# Patient Record
Sex: Female | Born: 1965 | Race: White | Hispanic: No | Smoking: Current every day smoker
Health system: Southern US, Community
[De-identification: ages and names within clinical notes are randomized; demographics above are authoritative.]

## PROBLEM LIST (undated history)

## (undated) DIAGNOSIS — E7800 Pure hypercholesterolemia, unspecified: Secondary | ICD-10-CM

## (undated) DIAGNOSIS — E079 Disorder of thyroid, unspecified: Secondary | ICD-10-CM

## (undated) DIAGNOSIS — N950 Postmenopausal bleeding: Secondary | ICD-10-CM

## (undated) DIAGNOSIS — E669 Obesity, unspecified: Secondary | ICD-10-CM

## (undated) DIAGNOSIS — F909 Attention-deficit hyperactivity disorder, unspecified type: Secondary | ICD-10-CM

## (undated) DIAGNOSIS — F419 Anxiety disorder, unspecified: Secondary | ICD-10-CM

## (undated) DIAGNOSIS — N3010 Interstitial cystitis (chronic) without hematuria: Secondary | ICD-10-CM

## (undated) DIAGNOSIS — A64 Unspecified sexually transmitted disease: Secondary | ICD-10-CM

## (undated) DIAGNOSIS — R7303 Prediabetes: Secondary | ICD-10-CM

## (undated) DIAGNOSIS — D649 Anemia, unspecified: Secondary | ICD-10-CM

## (undated) DIAGNOSIS — N189 Chronic kidney disease, unspecified: Secondary | ICD-10-CM

## (undated) DIAGNOSIS — K219 Gastro-esophageal reflux disease without esophagitis: Secondary | ICD-10-CM

## (undated) DIAGNOSIS — M25561 Pain in right knee: Secondary | ICD-10-CM

## (undated) DIAGNOSIS — F329 Major depressive disorder, single episode, unspecified: Secondary | ICD-10-CM

## (undated) DIAGNOSIS — F32A Depression, unspecified: Secondary | ICD-10-CM

## (undated) DIAGNOSIS — M549 Dorsalgia, unspecified: Secondary | ICD-10-CM

## (undated) DIAGNOSIS — E559 Vitamin D deficiency, unspecified: Secondary | ICD-10-CM

## (undated) DIAGNOSIS — M1990 Unspecified osteoarthritis, unspecified site: Secondary | ICD-10-CM

## (undated) HISTORY — PX: REPLACEMENT TOTAL KNEE: SUR1224

## (undated) HISTORY — DX: Pain in right knee: M25.561

## (undated) HISTORY — DX: Gastro-esophageal reflux disease without esophagitis: K21.9

## (undated) HISTORY — DX: Vitamin D deficiency, unspecified: E55.9

## (undated) HISTORY — DX: Unspecified osteoarthritis, unspecified site: M19.90

## (undated) HISTORY — DX: Depression, unspecified: F32.A

## (undated) HISTORY — PX: OTHER SURGICAL HISTORY: SHX169

## (undated) HISTORY — DX: Obesity, unspecified: E66.9

## (undated) HISTORY — DX: Major depressive disorder, single episode, unspecified: F32.9

## (undated) HISTORY — DX: Chronic kidney disease, unspecified: N18.9

## (undated) HISTORY — DX: Dorsalgia, unspecified: M54.9

## (undated) HISTORY — DX: Disorder of thyroid, unspecified: E07.9

## (undated) HISTORY — DX: Pure hypercholesterolemia, unspecified: E78.00

## (undated) HISTORY — DX: Anemia, unspecified: D64.9

## (undated) HISTORY — DX: Interstitial cystitis (chronic) without hematuria: N30.10

## (undated) HISTORY — DX: Anxiety disorder, unspecified: F41.9

## (undated) HISTORY — PX: FOOT SURGERY: SHX648

## (undated) HISTORY — DX: Unspecified sexually transmitted disease: A64

---

## 1998-06-20 HISTORY — PX: WISDOM TOOTH EXTRACTION: SHX21

## 2000-04-18 ENCOUNTER — Other Ambulatory Visit: Admission: RE | Admit: 2000-04-18 | Discharge: 2000-04-18 | Payer: Self-pay | Admitting: Gynecology

## 2001-04-19 ENCOUNTER — Other Ambulatory Visit: Admission: RE | Admit: 2001-04-19 | Discharge: 2001-04-19 | Payer: Self-pay | Admitting: Gynecology

## 2002-04-23 ENCOUNTER — Other Ambulatory Visit: Admission: RE | Admit: 2002-04-23 | Discharge: 2002-04-23 | Payer: Self-pay | Admitting: Gynecology

## 2002-10-22 ENCOUNTER — Other Ambulatory Visit: Admission: RE | Admit: 2002-10-22 | Discharge: 2002-10-22 | Payer: Self-pay | Admitting: Gynecology

## 2002-11-25 ENCOUNTER — Ambulatory Visit (HOSPITAL_BASED_OUTPATIENT_CLINIC_OR_DEPARTMENT_OTHER): Admission: RE | Admit: 2002-11-25 | Discharge: 2002-11-25 | Payer: Self-pay | Admitting: Urology

## 2002-11-25 ENCOUNTER — Encounter (INDEPENDENT_AMBULATORY_CARE_PROVIDER_SITE_OTHER): Payer: Self-pay

## 2003-08-08 ENCOUNTER — Other Ambulatory Visit: Admission: RE | Admit: 2003-08-08 | Discharge: 2003-08-08 | Payer: Self-pay | Admitting: Gynecology

## 2004-10-04 ENCOUNTER — Other Ambulatory Visit: Admission: RE | Admit: 2004-10-04 | Discharge: 2004-10-04 | Payer: Self-pay | Admitting: Gynecology

## 2005-10-17 ENCOUNTER — Other Ambulatory Visit: Admission: RE | Admit: 2005-10-17 | Discharge: 2005-10-17 | Payer: Self-pay | Admitting: Gynecology

## 2006-11-08 ENCOUNTER — Other Ambulatory Visit: Admission: RE | Admit: 2006-11-08 | Discharge: 2006-11-08 | Payer: Self-pay | Admitting: Gynecology

## 2007-11-09 ENCOUNTER — Other Ambulatory Visit: Admission: RE | Admit: 2007-11-09 | Discharge: 2007-11-09 | Payer: Self-pay | Admitting: Gynecology

## 2008-03-31 ENCOUNTER — Ambulatory Visit: Payer: Self-pay | Admitting: Gynecology

## 2008-05-06 ENCOUNTER — Ambulatory Visit: Payer: Self-pay | Admitting: Gynecology

## 2009-03-18 ENCOUNTER — Encounter: Payer: Self-pay | Admitting: Gynecology

## 2009-03-18 ENCOUNTER — Other Ambulatory Visit: Admission: RE | Admit: 2009-03-18 | Discharge: 2009-03-18 | Payer: Self-pay | Admitting: Gynecology

## 2009-03-18 ENCOUNTER — Ambulatory Visit: Payer: Self-pay | Admitting: Gynecology

## 2009-09-21 ENCOUNTER — Ambulatory Visit: Payer: Self-pay | Admitting: Gynecology

## 2009-09-21 ENCOUNTER — Other Ambulatory Visit: Admission: RE | Admit: 2009-09-21 | Discharge: 2009-09-21 | Payer: Self-pay | Admitting: Gynecology

## 2009-11-25 ENCOUNTER — Ambulatory Visit: Payer: Self-pay | Admitting: Gynecology

## 2009-12-11 ENCOUNTER — Ambulatory Visit: Payer: Self-pay | Admitting: Gynecology

## 2010-04-08 ENCOUNTER — Other Ambulatory Visit: Admission: RE | Admit: 2010-04-08 | Discharge: 2010-04-08 | Payer: Self-pay | Admitting: Gynecology

## 2010-04-08 ENCOUNTER — Ambulatory Visit: Payer: Self-pay | Admitting: Gynecology

## 2010-04-14 ENCOUNTER — Ambulatory Visit: Payer: Self-pay | Admitting: Gynecology

## 2010-06-16 ENCOUNTER — Encounter
Admission: RE | Admit: 2010-06-16 | Discharge: 2010-06-19 | Payer: Self-pay | Source: Home / Self Care | Admitting: Specialist

## 2010-06-20 ENCOUNTER — Encounter
Admission: RE | Admit: 2010-06-20 | Discharge: 2010-07-20 | Payer: Self-pay | Source: Home / Self Care | Attending: Specialist | Admitting: Specialist

## 2010-07-22 ENCOUNTER — Ambulatory Visit: Payer: 59 | Attending: Specialist | Admitting: *Deleted

## 2010-07-22 ENCOUNTER — Ambulatory Visit: Payer: Self-pay | Admitting: Physical Therapy

## 2010-07-22 DIAGNOSIS — R5381 Other malaise: Secondary | ICD-10-CM | POA: Insufficient documentation

## 2010-07-22 DIAGNOSIS — M25569 Pain in unspecified knee: Secondary | ICD-10-CM | POA: Insufficient documentation

## 2010-07-22 DIAGNOSIS — IMO0001 Reserved for inherently not codable concepts without codable children: Secondary | ICD-10-CM | POA: Insufficient documentation

## 2010-07-29 ENCOUNTER — Ambulatory Visit: Payer: 59 | Admitting: Physical Therapy

## 2010-11-05 NOTE — Op Note (Signed)
NAME:  Sheena [name], Sheena [name]                             ACCOUNT NO.:  [id number]   MEDICAL RECORD NO.:  [id number]                   PATIENT TYPE:  AMB   LOCATION:  [facility]                                 FACILITY:  [facility]   PHYSICIAN:  Sigmund I. Patsi Sears, M.D.         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  Urethral stenosis.   OPERATION:  Urethral dilation, cystourethroscopy, hydrodistention of the  bladder, bladder biopsy, cauterization of the biopsy sites and cauterization  of trigonitis, insertion of Clorpactin, insertion of Marcaine and Pyridium,  injection of Marcaine and Kenalog in the periurethral space and the bladder  base.   SURGEON:  Sigmund I. Patsi Sears, M.D.   ANESTHESIA:  General LMA.   PREPARATION:  After appropriate preanesthesia, the patient was brought to  the operating room and placed on the operating table in the dorsal supine  position where a B&O suppository was placed. She was placed in dorsal  lithotomy position where the pubis was prepped with Betadine solution and  draped in the usual fashion.   HISTORY:  This 45 year old female has urinary frequency and urgency, with  mild urinary incontinence of unknown origin. The patient does not leak on  [facility] test, but does have an IC  symptom index score of 13/5, and an IC  problem index of 10/5. Her urodynamics does show a stable compliant bladder  but with a poor urinary stream of 28 cc/second maximal flow, approximately  25th percentile. She has a 500 mL capacity bladder in the office, with a PVR  of 200 mL.   DESCRIPTION OF PROCEDURE:  With the patient in dorsal lithotomy position,  the inspection revealed a normal BUS with normal hair pattern, and  normal  vaginal estrogenization. The 21 cystoscope could not be placed with the  obturator because of severe urethral stenosis, and this was dilated to a  size 22. The 21 scope was then placed, with mild cracking and bleeding of  the urethra. This was anesthetized at the end of the case with Marcaine and  Kenalog solution.   Cystoscopy revealed a normal appearing bladder, and she was hydrodistended  from 800 to 900 mL capacity under anesthesia. Photo documentation was taken  of her bladder, and bladder biopsies were obtained. The biopsy sites were  cauterized, and the trigonitis noted was also cauterized. Marcaine and  Kenalog was injected into the bladder base. The patient was then awakened  after IV Toradol and taken to the recovery room in good condition.  Sigmund I. Patsi Sears, M.D.    SIT/MEDQ  D:  11/25/2002  T:  11/25/2002  Job:  [id number]   cc:   Marcial Pacas P. Audie Box, M.D.  [address]  [place]  [place] [address]  Fax: [phone]

## 2011-04-11 ENCOUNTER — Other Ambulatory Visit: Payer: Self-pay | Admitting: Gynecology

## 2011-05-30 ENCOUNTER — Other Ambulatory Visit: Payer: Self-pay | Admitting: Gynecology

## 2011-05-30 NOTE — Telephone Encounter (Signed)
Pt has not schedule annual.

## 2011-06-23 ENCOUNTER — Encounter: Payer: Self-pay | Admitting: Gynecology

## 2011-07-18 ENCOUNTER — Encounter: Payer: Self-pay | Admitting: Gynecology

## 2011-07-18 DIAGNOSIS — E079 Disorder of thyroid, unspecified: Secondary | ICD-10-CM | POA: Insufficient documentation

## 2011-07-18 DIAGNOSIS — A64 Unspecified sexually transmitted disease: Secondary | ICD-10-CM | POA: Insufficient documentation

## 2011-07-18 DIAGNOSIS — N3010 Interstitial cystitis (chronic) without hematuria: Secondary | ICD-10-CM | POA: Insufficient documentation

## 2011-07-25 ENCOUNTER — Encounter: Payer: Self-pay | Admitting: Gynecology

## 2011-07-25 ENCOUNTER — Ambulatory Visit (INDEPENDENT_AMBULATORY_CARE_PROVIDER_SITE_OTHER): Payer: BC Managed Care – PPO | Admitting: Gynecology

## 2011-07-25 VITALS — BP 128/72 | Ht 63.0 in | Wt 278.0 lb

## 2011-07-25 DIAGNOSIS — E781 Pure hyperglyceridemia: Secondary | ICD-10-CM | POA: Insufficient documentation

## 2011-07-25 DIAGNOSIS — K219 Gastro-esophageal reflux disease without esophagitis: Secondary | ICD-10-CM | POA: Insufficient documentation

## 2011-07-25 DIAGNOSIS — E7800 Pure hypercholesterolemia, unspecified: Secondary | ICD-10-CM | POA: Insufficient documentation

## 2011-07-25 DIAGNOSIS — Z01419 Encounter for gynecological examination (general) (routine) without abnormal findings: Secondary | ICD-10-CM

## 2011-07-25 DIAGNOSIS — E7849 Other hyperlipidemia: Secondary | ICD-10-CM | POA: Insufficient documentation

## 2011-07-25 NOTE — Patient Instructions (Signed)
Follow up in one year for your GYN checkup.   Consider Stop Smoking.  Help is available at [facility] smoking cessation program @ www.[place].com or 564-105-9669. OR 1-800-QUIT-NOW 551-528-1889) for free smoking cessation counseling.   Smoking Hazards Smoking cigarettes is extremely bad for your health. Tobacco smoke has over 200 known poisons in it. There are over 60 chemicals in tobacco smoke that cause cancer. Some of the chemicals found in cigarette smoke include:  Cyanide.  Benzene.  Formaldehyde.  Methanol (wood alcohol).  Acetylene (fuel used in welding torches).  Ammonia.  Cigarette smoke also contains the poisonous gases nitrogen oxide and carbon monoxide.  Cigarette smokers have an increased risk of many serious medical problems, including: Lung cancer.  Lung disease (such as pneumonia, bronchitis, and emphysema).  Heart attack and chest pain due to the heart not getting enough oxygen (angina).  Heart disease and peripheral blood vessel disease.  Hypertension.  Stroke.  Oral cancer (cancer of the lip, mouth, or voice box).  Bladder cancer.  Pancreatic cancer.  Cervical cancer.  Pregnancy complications, including premature birth.  Low birthweight babies.  Early menopause.  Lower estrogen level for women.  Infertility.  Facial wrinkles.  Blindness.  Increased risk of broken bones (fractures).  Senile dementia.  Stillbirths and smaller newborn babies, birth defects, and genetic damage to sperm.  Stomach ulcers and internal bleeding.  Children of smokers have an increased risk of the following, because of secondhand smoke exposure:  Sudden infant death syndrome (SIDS).  Respiratory infections.  Lung cancer.  Heart disease.  Ear infections.  Smoking causes approximately: 90% of all lung cancer deaths in men.  80% of all lung cancer deaths in women.  90% of deaths from chronic obstructive lung disease.  Compared with nonsmokers, smoking increases  the risk of: Coronary heart disease by 2 to 4 times.  Stroke by 2 to 4 times.  Men developing lung cancer by 23 times.  Women developing lung cancer by 13 times.  Dying from chronic obstructive lung diseases by 12 times.  Someone who smokes 2 packs a day loses about 8 years of his or her life. Even smoking lightly shortens your life expectancy by several years. You can greatly reduce the risk of medical problems for you and your family by stopping now. Smoking is the most preventable cause of death and disease in our society. Within days of quitting smoking, your circulation returns to normal, you decrease the risk of having a heart attack, and your lung capacity improves. There may be some increased phlegm in the first few days after quitting, and it may take months for your lungs to clear up completely. Quitting for 10 years cuts your lung cancer risk to almost that of a nonsmoker. WHY IS SMOKING ADDICTIVE? Nicotine is the chemical agent in tobacco that is capable of causing addiction or dependence.  When you smoke and inhale, nicotine is absorbed rapidly into the bloodstream through your lungs. Nicotine absorbed through the lungs is capable of creating a powerful addiction. Both inhaled and non-inhaled nicotine may be addictive.  Addiction studies of cigarettes and spit tobacco show that addiction to nicotine occurs mainly during the teen years, when young people begin using tobacco products.  WHAT ARE THE BENEFITS OF QUITTING?  There are many health benefits to quitting smoking.  Likelihood of developing cancer and heart disease decreases. Health improvements are seen almost immediately.  Blood pressure, pulse rate, and breathing patterns start returning to normal soon after quitting.  People who quit may see an improvement in their overall quality of life.  Some people choose to quit all at once. Other options include nicotine replacement products, such as patches, gum, and nasal sprays. Do not  use these products without first checking with your caregiver. QUITTING SMOKING It is not easy to quit smoking. Nicotine is addicting, and longtime habits are hard to change. To start, you can write down all your reasons for quitting, tell your family and friends you want to quit, and ask for their help. Throw your cigarettes away, chew gum or cinnamon sticks, keep your hands busy, and drink extra water or juice. Go for walks and practice deep breathing to relax. Think of all the money you are saving: around $1,000 a year, for the average pack-a-day smoker. Nicotine patches and gum have been shown to improve success at efforts to stop smoking. Zyban (bupropion) is an anti-depressant drug that can be prescribed to reduce nicotine withdrawal symptoms and to suppress the urge to smoke. Smoking is an addiction with both physical and psychological effects. Joining a stop-smoking support group can help you cope with the emotional issues. For more information and advice on programs to stop smoking, call your doctor, your local hospital, or these organizations: American Lung Association - 1-800-LUNGUSA  American Cancer Society - 1-800-ACS-2345  Document Released: 07/14/2004 Document Revised: 02/16/2011 Document Reviewed: 03/18/2009 [facility] Patient Information 2012 [place], [place].  Smoking Cessation This document explains the best ways for you to quit smoking and new treatments to help. It lists new medicines that can double or triple your chances of quitting and quitting for good. It also considers ways to avoid relapses and concerns you may have about quitting, including weight gain. NICOTINE: A POWERFUL ADDICTION If you have tried to quit smoking, you know how hard it can be. It is hard because nicotine is a very addictive drug. For some people, it can be as addictive as heroin or cocaine. Usually, people make 2 or 3 tries, or more, before finally being able to quit. Each time you try to quit, you can learn  about what helps and what hurts. Quitting takes hard work and a lot of effort, but you can quit smoking. QUITTING SMOKING IS ONE OF THE MOST IMPORTANT THINGS YOU WILL EVER DO.  You will live longer, feel better, and live better.   The impact on your body of quitting smoking is felt almost immediately:   Within 20 minutes, blood pressure decreases. Pulse returns to its normal level.   After 8 hours, carbon monoxide levels in the blood return to normal. Oxygen level increases.   After 24 hours, chance of heart attack starts to decrease. Breath, hair, and body stop smelling like smoke.   After 48 hours, damaged nerve endings begin to recover. Sense of taste and smell improve.   After 72 hours, the body is virtually free of nicotine. Bronchial tubes relax and breathing becomes easier.   After 2 to 12 weeks, lungs can hold more air. Exercise becomes easier and circulation improves.   Quitting will reduce your risk of having a heart attack, stroke, cancer, or lung disease:   After 1 year, the risk of coronary heart disease is cut in half.   After 5 years, the risk of stroke falls to the same as a nonsmoker.   After 10 years, the risk of lung cancer is cut in half and the risk of other cancers decreases significantly.   After 15 years, the risk of  coronary heart disease drops, usually to the level of a nonsmoker.   If you are pregnant, quitting smoking will improve your chances of having a healthy baby.   The people you live with, especially your children, will be healthier.   You will have extra money to spend on things other than cigarettes.  FIVE KEYS TO QUITTING Studies have shown that these 5 steps will help you quit smoking and quit for good. You have the best chances of quitting if you use them together: 1. Get ready.  2. Get support and encouragement.  3. Learn new skills and behaviors.  4. Get medicine to reduce your nicotine addiction and use it correctly.  5. Be prepared  for relapse or difficult situations. Be determined to continue trying to quit, even if you do not succeed at first.  1. GET READY  Set a quit date.   Change your environment.   Get rid of ALL cigarettes, ashtrays, matches, and lighters in your home, car, and place of work.   Do not let people smoke in your home.   Review your past attempts to quit. Think about what worked and what did not.   Once you quit, do not smoke. NOT EVEN A PUFF!  2. GET SUPPORT AND ENCOURAGEMENT Studies have shown that you have a better chance of being successful if you have help. You can get support in many ways.  Tell your family, friends, and coworkers that you are going to quit and need their support. Ask them not to smoke around you.   Talk to your caregivers (doctor, dentist, nurse, pharmacist, psychologist, and/or smoking counselor).   Get individual, group, or telephone counseling and support. The more counseling you have, the better your chances are of quitting. Programs are available at [employer] and health centers. Call your local health department for information about programs in your area.   Spiritual beliefs and practices may help some smokers quit.   Quit meters are Photographer that keep track of quit statistics, such as amount of "quit-time," cigarettes not smoked, and money saved.   Many smokers find one or more of the many self-help books available useful in helping them quit and stay off tobacco.  3. LEARN NEW SKILLS AND BEHAVIORS  Try to distract yourself from urges to smoke. Talk to someone, go for a walk, or occupy your time with a task.   When you first try to quit, change your routine. Take a different route to work. Drink tea instead of coffee. Eat breakfast in a different place.   Do something to reduce your stress. Take a hot bath, exercise, or read a book.   Plan something enjoyable to do every day. Reward yourself for not smoking.    Explore interactive web-based programs that specialize in helping you quit.  4. GET MEDICINE AND USE IT CORRECTLY Medicines can help you stop smoking and decrease the urge to smoke. Combining medicine with the above behavioral methods and support can quadruple your chances of successfully quitting smoking. The U.S. Food and Drug Administration (FDA) has approved 7 medicines to help you quit smoking. These medicines fall into 3 categories.  Nicotine replacement therapy (delivers nicotine to your body without the negative effects and risks of smoking):   Nicotine gum: Available over-the-counter.   Nicotine lozenges: Available over-the-counter.   Nicotine inhaler: Available by prescription.   Nicotine nasal spray: Available by prescription.   Nicotine skin patches (transdermal): Available by prescription and  over-the-counter.   Antidepressant medicine (helps people abstain from smoking, but how this works is unknown):   Bupropion sustained-release (SR) tablets: Available by prescription.   Nicotinic receptor partial agonist (simulates the effect of nicotine in your brain):   Varenicline tartrate tablets: Available by prescription.   Ask your caregiver for advice about which medicines to use and how to use them. Carefully read the information on the package.   Everyone who is trying to quit may benefit from using a medicine. If you are pregnant or trying to become pregnant, nursing an infant, you are under age 85, or you smoke fewer than 10 cigarettes per day, talk to your caregiver before taking any nicotine replacement medicines.   You should stop using a nicotine replacement product and call your caregiver if you experience nausea, dizziness, weakness, vomiting, fast or irregular heartbeat, mouth problems with the lozenge or gum, or redness or swelling of the skin around the patch that does not go away.   Do not use any other product containing nicotine while using a nicotine  replacement product.   Talk to your caregiver before using these products if you have diabetes, heart disease, asthma, stomach ulcers, you had a recent heart attack, you have high blood pressure that is not controlled with medicine, a history of irregular heartbeat, or you have been prescribed medicine to help you quit smoking.  5. BE PREPARED FOR RELAPSE OR DIFFICULT SITUATIONS  Most relapses occur within the first 3 months after quitting. Do not be discouraged if you start smoking again. Remember, most people try several times before they finally quit.   You may have symptoms of withdrawal because your body is used to nicotine. You may crave cigarettes, be irritable, feel very hungry, cough often, get headaches, or have difficulty concentrating.   The withdrawal symptoms are only temporary. They are strongest when you first quit, but they will go away within 10 to 14 days.  Here are some difficult situations to watch for:  Alcohol. Avoid drinking alcohol. Drinking lowers your chances of successfully quitting.   Caffeine. Try to reduce the amount of caffeine you consume. It also lowers your chances of successfully quitting.   Other smokers. Being around smoking can make you want to smoke. Avoid smokers.   Weight gain. Many smokers will gain weight when they quit, usually less than 10 pounds. Eat a healthy diet and stay active. Do not let weight gain distract you from your main goal, quitting smoking. Some medicines that help you quit smoking may also help delay weight gain. You can always lose the weight gained after you quit.   Bad mood or depression. There are a lot of ways to improve your mood other than smoking.  If you are having problems with any of these situations, talk to your caregiver. SPECIAL SITUATIONS AND CONDITIONS Studies suggest that everyone can quit smoking. Your situation or condition can give you a special reason to quit.  Pregnant women/new mothers: By quitting, you  protect your baby's health and your own.   Hospitalized patients: By quitting, you reduce health problems and help healing.   Heart attack patients: By quitting, you reduce your risk of a second heart attack.   Lung, head, and neck cancer patients: By quitting, you reduce your chance of a second cancer.   Parents of children and adolescents: By quitting, you protect your children from illnesses caused by secondhand smoke.  QUESTIONS TO THINK ABOUT Think about the following questions before you try  to stop smoking. You may want to talk about your answers with your caregiver.  Why do you want to quit?   If you tried to quit in the past, what helped and what did not?   What will be the most difficult situations for you after you quit? How will you plan to handle them?   Who can help you through the tough times? Your family? Friends? Caregiver?   What pleasures do you get from smoking? What ways can you still get pleasure if you quit?  Here are some questions to ask your caregiver:  How can you help me to be successful at quitting?   What medicine do you think would be best for me and how should I take it?   What should I do if I need more help?   What is smoking withdrawal like? How can I get information on withdrawal?  Quitting takes hard work and a lot of effort, but you can quit smoking. FOR MORE INFORMATION  Smokefree.gov (http://www.davis-sullivan.com/) provides free, accurate, evidence-based information and professional assistance to help support the immediate and long-term needs of people trying to quit smoking. Document Released: 05/31/2001 Document Revised: 02/16/2011 Document Reviewed: 03/23/2009 [facility] Patient Information 2012 [place], [place].

## 2011-07-25 NOTE — Progress Notes (Signed)
Sheena [name] 1966/02/14 [id number]        46 y.o.  for annual exam.  Several issues noted below.  Past medical history,surgical history, medications, allergies, family history and social history were all reviewed and documented in the EPIC chart. ROS:  Was performed and pertinent positives and negatives are included in the history.  Exam: Sherrilyn Rist chaperone present Filed Vitals:   07/25/11 1507  BP: 128/72   General appearance  Normal Skin grossly normal Head/Neck normal with no cervical or supraclavicular adenopathy thyroid normal Lungs  clear Cardiac RR, without RMG Abdominal  soft, nontender, without masses, organomegaly or hernia Breasts  examined lying and sitting without masses, retractions, discharge or axillary adenopathy. Pelvic  Ext/BUS/vagina  normal   Cervix  normal    Uterus  axial, normal size, shape and contour, midline and mobile nontender. Exam limited by abdominal girth  Adnexa  Without masses or tenderness.  Exam limited by abdominal girth  Anus and perineum  normal   Rectovaginal  normal sphincter tone without palpated masses or tenderness.    Assessment/Plan:  46 y.o. female for annual exam.   Monthly menses. 1. Contraception. Patient using condoms. Again discussed the failure risk with these and she is not interested in anything else. I discussed the availability of Plan B in case there is a break or slip of the condom. 2. Breast health. SBE monthly reviewed. She had mammography January 2013 will continued annual mammography. 3. Pap smear. No Pap was done today. She has no history of significant abnormalities in multiple normal reports in her chart the last one in October 2011.  I discussed current screening guidelines and we'll plan on an every 3 year Pap smears she agrees. 4. Smoking. I again discussed stop smoking and provided strategies to help her with this. 5. Weight. I discussed weight loss and exercise options. 6. 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 She does have a prescription of Valtrex to start if she does develop symptoms early. 7. Cymbalta. She had stopped her Cymbalta a week ago on her own do to feelings of lethargy and actually feels better off of it. She'll continue to stay off of this. As long as she continues well then we'll follow. If she does have return of anxiety or depression discussed the possible trial of a lower dose at 30 mg daily. 8. Health maintenance. Blood work was done today this is all done through Dr. Vella Redhead office who follows her for her hypothyroidism and also follows her cholesterol and glycemic control.    Dara Lords MD, 3:32 PM 07/25/2011

## 2011-08-23 ENCOUNTER — Telehealth: Payer: Self-pay | Admitting: *Deleted

## 2011-08-23 MED ORDER — DULOXETINE HCL 30 MG PO CPEP: 30.0000 mg | ORAL_CAPSULE | Freq: Every day | ORAL | Status: DC

## 2011-08-23 NOTE — Telephone Encounter (Signed)
Pt called requesting to start back on Cymbalta, she is feeling anxiety & depression. Pt was told to call and she would be started back medication and a lower dose. Please advise

## 2011-08-23 NOTE — Telephone Encounter (Signed)
She can try Cymbalta 30 mg daily. If she has any issues with this call me.

## 2011-08-23 NOTE — Telephone Encounter (Signed)
Pt informed with the below note. 

## 2011-09-12 ENCOUNTER — Telehealth: Payer: Self-pay | Admitting: *Deleted

## 2011-09-12 MED ORDER — VALACYCLOVIR HCL 500 MG PO TABS: 500.0000 mg | ORAL_TABLET | Freq: Two times a day (BID) | ORAL | Status: AC

## 2011-09-12 NOTE — Telephone Encounter (Signed)
Patient calling requesting rx for Valtrex.  Currently having an outbreak.

## 2011-09-12 NOTE — Telephone Encounter (Signed)
Valtrex 500 mg bid x 5 days

## 2011-09-12 NOTE — Telephone Encounter (Signed)
Patient informed. 

## 2012-06-27 ENCOUNTER — Encounter: Payer: Self-pay | Admitting: Gynecology

## 2012-07-12 ENCOUNTER — Telehealth: Payer: Self-pay | Admitting: *Deleted

## 2012-07-12 NOTE — Telephone Encounter (Signed)
Pt called c/o no cycle since November 2013, she would have symptoms of breast tenderness and headaches but no cycle would start. She mention that cycles at times would be irregular but this would be a first without one. Her annual is due in Feb. She would like you to be aware of the above. Please advise

## 2012-07-12 NOTE — Telephone Encounter (Signed)
Recommend office visit for evaluation. If patient wants to wait for her annual at her choice and that's okay her come in now I'll see her. Regardless this needs to be evaluated.

## 2012-07-13 NOTE — Telephone Encounter (Signed)
Pt informed with the below note. 

## 2012-07-31 ENCOUNTER — Ambulatory Visit (INDEPENDENT_AMBULATORY_CARE_PROVIDER_SITE_OTHER): Payer: BC Managed Care – PPO | Admitting: Gynecology

## 2012-07-31 ENCOUNTER — Encounter: Payer: Self-pay | Admitting: Gynecology

## 2012-07-31 ENCOUNTER — Other Ambulatory Visit (HOSPITAL_COMMUNITY)
Admission: RE | Admit: 2012-07-31 | Discharge: 2012-07-31 | Disposition: A | Discharge status: Home / Self Care | Payer: BC Managed Care – PPO | Source: Ambulatory Visit | Attending: Gynecology | Admitting: Gynecology

## 2012-07-31 VITALS — BP 124/80 | Ht 63.0 in | Wt 267.0 lb

## 2012-07-31 DIAGNOSIS — N926 Irregular menstruation, unspecified: Secondary | ICD-10-CM

## 2012-07-31 DIAGNOSIS — N951 Menopausal and female climacteric states: Secondary | ICD-10-CM

## 2012-07-31 DIAGNOSIS — Z01419 Encounter for gynecological examination (general) (routine) without abnormal findings: Secondary | ICD-10-CM | POA: Insufficient documentation

## 2012-07-31 DIAGNOSIS — Z1151 Encounter for screening for human papillomavirus (HPV): Secondary | ICD-10-CM | POA: Insufficient documentation

## 2012-07-31 NOTE — Progress Notes (Signed)
Sheena [name] Jan 17, 1966 [id number]        47 y.o.  G1P1001 for annual exam.  Several issues noted below.  Past medical history,surgical history, medications, allergies, family history and social history were all reviewed and documented in the EPIC chart. ROS:  Was performed and pertinent positives and negatives are included in the history.  Exam: Kim assistant Filed Vitals:   07/31/12 1358  BP: 124/80  Height: 5\' 3"  (1.6 m)  Weight: 267 lb (121.11 kg)   General appearance  Normal Skin grossly normal Head/Neck normal with no cervical or supraclavicular adenopathy thyroid normal Lungs  clear Cardiac RR, without RMG Abdominal  soft, nontender, without masses, organomegaly or hernia Breasts  examined lying and sitting without masses, retractions, discharge or axillary adenopathy. Pelvic  Ext/BUS/vagina  normal   Cervix  normal   Uterus  grossly normal size,midline and mobile nontender   Adnexa  Without masses or tenderness    Anus and perineum  normal   Rectovaginal  normal sphincter tone without palpated masses or tenderness.    Assessment/Plan:  47 y.o. G67P1001 female for annual exam.   1. Irregular menses/menopausal symptoms. Patient normally has every 30 day menses sometimes 45 days.  Notes that her last menstrual period was in October and then she skipped November and December but then had a menses the end of January. She is noticing some hot flashes and night sweats.  Will check baseline TSH FSH prolactin. Will keep menstrual calendar and as long as regular them we'll continue to follow. If irregularity continues she knows to follow up with me. 2. Joint aching. Patient notes waking with her joints aching. She does have arthroscopic issues in both knees. I reviewed the issue of weight and joint stress. We'll check ANA for completeness. 3. Contraception. Patient using condoms. We've discussed this previously and I reviewed again the failure risk and availability of Plan B. Patient  not interested in anything further. 4. 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 We'll continue to monitor. 5. Mammography 06/2012. Patient noted some left breast tenderness since the mammogram. Exam today is normal. Recommended heat and ibuprofen. Assuming it resolved then we will follow. If it persists or she feels anything she will call. 6. Pap smear 2011. Pap/HPV today. Discussed adding HPV to the screen. No history of abnormal Pap smears previously. If normal then planned 5 year follow up. 7. Stop smoking discussed and encouraged. Patient is attempting to cut back. 8. Weight control. Dr. Evlyn Kanner started her on a weight reducing medication. I discussed the need to modify diet and increase activity with this. 9. Health maintenance. No other blood work done as it is all done through Dr. Rinaldo Cloud office.  Follow up in one year, sooner as needed. 10Dara Lords MD, 2:32 PM 07/31/2012

## 2012-07-31 NOTE — Patient Instructions (Signed)
Follow up for lab work. Follow up for annual exam in one year

## 2012-08-01 ENCOUNTER — Encounter: Payer: Self-pay | Admitting: Gynecology

## 2012-08-01 LAB — TSH: TSH: 1.022 u[IU]/mL (ref 0.350–4.500)

## 2012-08-01 LAB — FOLLICLE STIMULATING HORMONE: FSH: 5 m[IU]/mL

## 2012-08-01 LAB — PROLACTIN: Prolactin: 23 ng/mL

## 2012-08-06 ENCOUNTER — Other Ambulatory Visit: Payer: Self-pay | Admitting: Gynecology

## 2012-08-06 MED ORDER — FLUCONAZOLE 150 MG PO TABS: 150.0000 mg | ORAL_TABLET | Freq: Once | ORAL | Status: DC

## 2012-09-28 ENCOUNTER — Other Ambulatory Visit: Payer: Self-pay | Admitting: Gynecology

## 2013-04-18 ENCOUNTER — Ambulatory Visit (INDEPENDENT_AMBULATORY_CARE_PROVIDER_SITE_OTHER): Payer: BC Managed Care – PPO | Admitting: Nurse Practitioner

## 2013-04-18 ENCOUNTER — Encounter: Payer: Self-pay | Admitting: Nurse Practitioner

## 2013-04-18 ENCOUNTER — Telehealth: Payer: Self-pay | Admitting: Nurse Practitioner

## 2013-04-18 VITALS — BP 167/86 | HR 93 | Temp 100.0°F | Ht 63.0 in | Wt 272.0 lb

## 2013-04-18 DIAGNOSIS — R300 Dysuria: Secondary | ICD-10-CM

## 2013-04-18 DIAGNOSIS — N390 Urinary tract infection, site not specified: Secondary | ICD-10-CM

## 2013-04-18 LAB — POCT URINALYSIS DIPSTICK
Ketones, UA: NEGATIVE
Protein, UA: NEGATIVE
Spec Grav, UA: 1.01
Urobilinogen, UA: NEGATIVE
pH, UA: 6.5

## 2013-04-18 LAB — POCT UA - MICROSCOPIC ONLY
Casts, Ur, LPF, POC: NEGATIVE
Yeast, UA: NEGATIVE

## 2013-04-18 MED ORDER — NITROFURANTOIN MONOHYD MACRO 100 MG PO CAPS: 100.0000 mg | ORAL_CAPSULE | Freq: Two times a day (BID) | ORAL | Status: DC

## 2013-04-18 NOTE — Telephone Encounter (Signed)
appt given for today 

## 2013-04-18 NOTE — Patient Instructions (Addendum)

## 2013-04-18 NOTE — Progress Notes (Signed)
  Subjective:    Patient ID: Sheena [name], female    DOB: [birth year], 47 y.o.   MRN: [id number]  Urinary Tract Infection  This is a new problem. The current episode started in the past 7 days. The problem occurs every urination. The problem has been gradually worsening. The pain is at a severity of 0/10. The patient is experiencing no pain. The maximum temperature recorded prior to her arrival was 100 - 100.9 F. The fever has been present for 1 - 2 days. There is no history of pyelonephritis. Associated symptoms include frequency. Pertinent negatives include no hematuria, hesitancy or urgency. She has tried nothing for the symptoms.      Review of Systems  Genitourinary: Positive for frequency. Negative for hesitancy, urgency and hematuria.  All other systems reviewed and are negative.       Objective:   Physical Exam  Vitals reviewed. Constitutional: She is oriented to person, place, and time. She appears well-developed and well-nourished.  Cardiovascular: Normal rate, regular rhythm, normal heart sounds and intact distal pulses.   Pulmonary/Chest: Effort normal and breath sounds normal.  Musculoskeletal: Normal range of motion. She exhibits no tenderness (Neg for CVA tenderness).  Neurological: She is alert and oriented to person, place, and time.  Psychiatric: She has a normal mood and affect. Her behavior is normal. Judgment and thought content normal.    BP 167/86  Pulse 93  Temp(Src) 100 F (37.8 C) (Oral)  Ht 5\' 3"  (1.6 m)  Wt 272 lb (123.378 kg)  BMI 48.19 kg/m2   Results for orders placed in visit on 04/18/13  POCT URINALYSIS DIPSTICK      Result Value Range   Color, UA yellow     Clarity, UA clear     Glucose, UA neg     Bilirubin, UA neg     Ketones, UA neg     Spec Grav, UA 1.010     Blood, UA neg     pH, UA 6.5     Protein, UA neg     Urobilinogen, UA negative     Nitrite, UA neg     Leukocytes, UA Negative          Assessment & Plan:   1. UTI  (urinary tract infection)   2. Dysuria    AZO OTC if helps Force Fluids  Mary-Margaret Daphine Deutscher, FNP

## 2013-04-25 ENCOUNTER — Other Ambulatory Visit: Payer: Self-pay

## 2013-05-20 ENCOUNTER — Ambulatory Visit (INDEPENDENT_AMBULATORY_CARE_PROVIDER_SITE_OTHER): Payer: BC Managed Care – PPO | Admitting: Family Medicine

## 2013-05-20 ENCOUNTER — Encounter: Payer: Self-pay | Admitting: Family Medicine

## 2013-05-20 VITALS — BP 116/70 | HR 100 | Temp 98.2°F | Ht 63.0 in | Wt 271.0 lb

## 2013-05-20 DIAGNOSIS — Z23 Encounter for immunization: Secondary | ICD-10-CM

## 2013-05-20 DIAGNOSIS — L0291 Cutaneous abscess, unspecified: Secondary | ICD-10-CM

## 2013-05-20 MED ORDER — DOXYCYCLINE HYCLATE 100 MG PO CAPS: 100.0000 mg | ORAL_CAPSULE | Freq: Two times a day (BID) | ORAL | Status: DC

## 2013-05-20 MED ORDER — MUPIROCIN 2 % EX OINT: 1.0000 "application " | TOPICAL_OINTMENT | Freq: Two times a day (BID) | CUTANEOUS | Status: DC

## 2013-05-20 NOTE — Progress Notes (Signed)
Patient name: Sheena [name] Medical record number: [id number] Date of birth: [birth year] Age: 47 y.o. Gender: female  Primary Care Provider: Rudi Heap, MD  Chief Complaint: abscess History of Present Illness: Pt presents today with chief complaint of abscess Pt initially noticed pruritic lesion on R abdomen earlier in the week.  Pt chronically scratched area until it drained.  Pt states area began to get mildly painful with purulent drainage over the last 1-2 days No fever or chills.  Non diabetic 1PPD smoker   Past Medical History: Patient Active Problem List   Diagnosis Date Noted  . High cholesterol   . GERD (gastroesophageal reflux disease)   . STD (sexually transmitted disease)   . Thyroid disease   . IC (interstitial cystitis)    Past Medical History  Diagnosis Date  . STD (sexually transmitted disease)     HSV ll  . Thyroid disease     Hypothyroid  . IC (interstitial cystitis)   . Depression   . High cholesterol   . GERD (gastroesophageal reflux disease)     Past Surgical History: Past Surgical History  Procedure Laterality Date  . Cesarean section    . Foot surgery      Social History: History   Social History  . Marital Status: Married    Spouse Name: N/A    Number of Children: N/A  . Years of Education: N/A   Social History Main Topics  . Smoking status: Current Every Day Smoker -- 1.00 packs/day  . Smokeless tobacco: Never Used  . Alcohol Use: No  . Drug Use: No  . Sexual Activity: Yes    Birth Control/ Protection: Condom     Comment: very little   Other Topics Concern  . None   Social History Narrative  . None    Family History: Family History  Problem Relation Age of Onset  . Diabetes Father   . Hypertension Father   . Cancer Father     melanoma  . Rheum arthritis Mother   . Breast cancer Cousin 25    Pat 1st cousin    Allergies: Allergies  Allergen Reactions  . Sulfa Antibiotics     Current Outpatient Prescriptions   Medication Sig Dispense Refill  . DULoxetine (CYMBALTA) 30 MG capsule TAKE ONE CAPSULE BY MOUTH EVERY DAY  30 capsule  11  . FENOFIBRATE PO Take by mouth.      . Levothyroxine Sodium (SYNTHROID PO) Take 175 mcg by mouth.       . Omeprazole (PRILOSEC PO) Take by mouth.      . TOPIRAMATE PO Take by mouth.      . doxycycline (VIBRAMYCIN) 100 MG capsule Take 1 capsule (100 mg total) by mouth 2 (two) times daily.  20 capsule  0  . mupirocin ointment (BACTROBAN) 2 % Place 1 application into the nose 2 (two) times daily.  22 g  0   No current facility-administered medications for this visit.   Review Of Systems: 12 point ROS negative except as noted above in HPI.  Physical Exam: Filed Vitals:   05/20/13 1117  BP: 116/70  Pulse: 100  Temp: 98.2 F (36.8 C)    General: alert and cooperative HEENT: PERRLA and extra ocular movement intact Heart: S1, S2 normal, no murmur, rub or gallop, regular rate and rhythm Lungs: clear to auscultation, no wheezes or rales and unlabored breathing Abdomen: R upper abdomen lesion (0.5-1 cm circumferential lesion, mildly tender, mild purulent lesion) Extremities: extremities normal, atraumatic,  no cyanosis or edema Skin:as above  Neurology: normal without focal findings  Labs and Imaging:   Assessment and Plan: Need for Tdap vaccination - Plan: Tdap vaccine greater than or equal to 7yo IM  Abscess - Plan: mupirocin ointment (BACTROBAN) 2 %, doxycycline (VIBRAMYCIN) 100 MG capsule  Wound culture  Will place on topical bactroban and doxy for infectious coverage pending wound culture Discussed derm and infectious red flags at length Follow up as needed.          Doree Albee MD

## 2013-05-20 NOTE — Patient Instructions (Addendum)
Smoking Cessation Quitting smoking is important to your health and has many advantages. However, it is not always easy to quit since nicotine is a very addictive drug. Often times, people try 3 times or more before being able to quit. This document explains the best ways for you to prepare to quit smoking. Quitting takes hard work and a lot of effort, but you can do it. ADVANTAGES OF QUITTING SMOKING  You will live longer, feel better, and live better.  Your body will feel the impact of quitting smoking almost immediately.  Within 20 minutes, blood pressure decreases. Your pulse returns to its normal level.  After 8 hours, carbon monoxide levels in the blood return to normal. Your oxygen level increases.  After 24 hours, the chance of having a heart attack starts to decrease. Your breath, hair, and body stop smelling like smoke.  After 48 hours, damaged nerve endings begin to recover. Your sense of taste and smell improve.  After 72 hours, the body is virtually free of nicotine. Your bronchial tubes relax and breathing becomes easier.  After 2 to 12 weeks, lungs can hold more air. Exercise becomes easier and circulation improves.  The risk of having a heart attack, stroke, cancer, or lung disease is greatly reduced.  After 1 year, the risk of coronary heart disease is cut in half.  After 5 years, the risk of stroke falls to the same as a nonsmoker.  After 10 years, the risk of lung cancer is cut in half and the risk of other cancers decreases significantly.  After 15 years, the risk of coronary heart disease drops, usually to the level of a nonsmoker.  If you are pregnant, quitting smoking will improve your chances of having a healthy baby.  The people you live with, especially any children, will be healthier.  You will have extra money to spend on things other than cigarettes. QUESTIONS TO THINK ABOUT BEFORE ATTEMPTING TO QUIT You may want to talk about your answers with your  caregiver.  Why do you want to quit?  If you tried to quit in the past, what helped and what did not?  What will be the most difficult situations for you after you quit? How will you plan to handle them?  Who can help you through the tough times? Your family? Friends? A caregiver?  What pleasures do you get from smoking? What ways can you still get pleasure if you quit? Here are some questions to ask your caregiver:  How can you help me to be successful at quitting?  What medicine do you think would be best for me and how should I take it?  What should I do if I need more help?  What is smoking withdrawal like? How can I get information on withdrawal? GET READY  Set a quit date.  Change your environment by getting rid of all cigarettes, ashtrays, matches, and lighters in your home, car, or work. Do not let people smoke in your home.  Review your past attempts to quit. Think about what worked and what did not. GET SUPPORT AND ENCOURAGEMENT You have a better chance of being successful if you have help. You can get support in many ways.  Tell your family, friends, and co-workers that you are going to quit and need their support. Ask them not to smoke around you.  Get individual, group, or telephone counseling and support. Programs are available at local hospitals and health centers. Call your local health department for   information about programs in your area.  Spiritual beliefs and practices may help some smokers quit.  Download a "quit meter" on your computer to keep track of quit statistics, such as how long you have gone without smoking, cigarettes not smoked, and money saved.  Get a self-help book about quitting smoking and staying off of tobacco. LEARN NEW SKILLS AND BEHAVIORS  Distract yourself from urges to smoke. Talk to someone, go for a walk, or occupy your time with a task.  Change your normal routine. Take a different route to work. Drink tea instead of coffee.  Eat breakfast in a different place.  Reduce your stress. Take a hot bath, exercise, or read a book.  Plan something enjoyable to do every day. Reward yourself for not smoking.  Explore interactive web-based programs that specialize in helping you quit. GET MEDICINE AND USE IT CORRECTLY Medicines can help you stop smoking and decrease the urge to smoke. Combining medicine with the above behavioral methods and support can greatly increase your chances of successfully quitting smoking.  Nicotine replacement therapy helps deliver nicotine to your body without the negative effects and risks of smoking. Nicotine replacement therapy includes nicotine gum, lozenges, inhalers, nasal sprays, and skin patches. Some may be available over-the-counter and others require a prescription.  Antidepressant medicine helps people abstain from smoking, but how this works is unknown. This medicine is available by prescription.  Nicotinic receptor partial agonist medicine simulates the effect of nicotine in your brain. This medicine is available by prescription. Ask your caregiver for advice about which medicines to use and how to use them based on your health history. Your caregiver will tell you what side effects to look out for if you choose to be on a medicine or therapy. Carefully read the information on the package. Do not use any other product containing nicotine while using a nicotine replacement product.  RELAPSE OR DIFFICULT SITUATIONS Most relapses occur within the first 3 months after quitting. Do not be discouraged if you start smoking again. Remember, most people try several times before finally quitting. You may have symptoms of withdrawal because your body is used to nicotine. You may crave cigarettes, be irritable, feel very hungry, cough often, get headaches, or have difficulty concentrating. The withdrawal symptoms are only temporary. They are strongest when you first quit, but they will go away within  10 14 days. To reduce the chances of relapse, try to:  Avoid drinking alcohol. Drinking lowers your chances of successfully quitting.  Reduce the amount of caffeine you consume. Once you quit smoking, the amount of caffeine in your body increases and can give you symptoms, such as a rapid heartbeat, sweating, and anxiety.  Avoid smokers because they can make you want to smoke.  Do not let weight gain distract you. Many smokers will gain weight when they quit, usually less than 10 pounds. Eat a healthy diet and stay active. You can always lose the weight gained after you quit.  Find ways to improve your mood other than smoking. FOR MORE INFORMATION  www.smokefree.gov  Document Released: 05/31/2001 Document Revised: 12/06/2011 Document Reviewed: 09/15/2011 [facility] Patient Information 2014 [place], [place].  Diphtheria/Tetanus Toxoids; Pertussis Vaccine, DTP injection What is this medicine? DIPHTHERIA and TETANUS TOXOIDS; PERTUSSIS VACCINE (dif THEER ee uh and TET n [place] TOK soids; per TUS iss vak SEEN) is used to prevent diphtheria, tetanus, and pertussis infections. This medicine may be used for other purposes; ask your health care provider or pharmacist if you  have questions. COMMON BRAND NAME(S): Adacel, Boostrix, Certiva, Daptacel, Infanrix, Tripedia What should I tell my health care provider before I take this medicine? They need to know if you have any of these conditions: -blood disorders like hemophilia -fever or infection -immune system problems -neurologic disease -seizures -an unusual or allergic reaction to vaccines, thimerosal, latex, other medicines, foods, dyes, or preservatives -pregnant or trying to get pregnant -breast-feeding How should I use this medicine? This vaccine is for injection into a muscle. It is given by a health care professional. A copy of Vaccine Information Statements will be given before each vaccination. Read this sheet carefully each time. The sheet  may change frequently. Talk to your pediatrician regarding the use of this vaccine in children. While the DTP vaccine may be given to children ages 6 weeks to 7 years and the Tdap vaccine may be given to children at least 36 years old, precautions do apply. Overdosage: If you think you have taken too much of this medicine contact a poison control center or emergency room at once. NOTE: This medicine is only for you. Do not share this medicine with others. What if I miss a dose? It is important not to miss your dose. Call your doctor or health care professional if you are unable to keep an appointment. What may interact with this medicine? -immune globulin -medicines that suppress your immune function like adalimumab, anakinra, infliximab -medicines to treat cancer -medicines that treat or prevent blood clots like warfarin, enoxaparin, and dalteparin -steroid medicines like prednisone or cortisone This list may not describe all possible interactions. Give your health care provider a list of all the medicines, herbs, non-prescription drugs, or dietary supplements you use. Also tell them if you smoke, drink alcohol, or use illegal drugs. Some items may interact with your medicine. What should I watch for while using this medicine? See your health care provider for all shots of this vaccine as directed. To have protection from infection, you must have 3 shots of this vaccine plus boosters as needed. Tell your doctor right away if you have any serious or unusual side effects after getting this vaccine. What side effects may I notice from receiving this medicine? Side effects that you should report to your doctor or health care professional as soon as possible: -allergic reactions like skin rash, itching or hives, swelling of the face, lips, or tongue -breathing problems -fever of 103 degrees F or more -flu-like symptoms -inconsolable crying -infection -pain, tingling, numbness in the hands or  feet -seizures -swelling of arm or leg that was injected -unusually weak or tired Side effects that usually do not require immediate medical attention (report these side effects to your doctor or health care professional if they continue or are bothersome): -fussy, irritable -loss of appetite -fever of 102 degrees F or less -pain, tenderness, redness, swelling, or a 'knot' at site where injected -vomiting This list may not describe all possible side effects. Call your doctor for medical advice about side effects. You may report side effects to FDA at 1-800-FDA-1088. Where should I keep my medicine? This drug is given in a hospital or clinic and will not be stored at home. NOTE: This sheet is a summary. It may not cover all possible information. If you have questions about this medicine, talk to your doctor, pharmacist, or health care provider.  2014, Elsevier/Gold Standard. (2012-10-25 11:09:27)   Abscess An abscess is an infected area that contains a collection of pus and debris.It can occur  in almost any part of the body. An abscess is also known as a furuncle or boil. CAUSES  An abscess occurs when tissue gets infected. This can occur from blockage of oil or sweat glands, infection of hair follicles, or a minor injury to the skin. As the body tries to fight the infection, pus collects in the area and creates pressure under the skin. This pressure causes pain. People with weakened immune systems have difficulty fighting infections and get certain abscesses more often.  SYMPTOMS Usually an abscess develops on the skin and becomes a painful mass that is red, warm, and tender. If the abscess forms under the skin, you may feel a moveable soft area under the skin. Some abscesses break open (rupture) on their own, but most will continue to get worse without care. The infection can spread deeper into the body and eventually into the bloodstream, causing you to feel ill.  DIAGNOSIS  Your  caregiver will take your medical history and perform a physical exam. A sample of fluid may also be taken from the abscess to determine what is causing your infection. TREATMENT  Your caregiver may prescribe antibiotic medicines to fight the infection. However, taking antibiotics alone usually does not cure an abscess. Your caregiver may need to make a small cut (incision) in the abscess to drain the pus. In some cases, gauze is packed into the abscess to reduce pain and to continue draining the area. HOME CARE INSTRUCTIONS   Only take over-the-counter or prescription medicines for pain, discomfort, or fever as directed by your caregiver.  If you were prescribed antibiotics, take them as directed. Finish them even if you start to feel better.  If gauze is used, follow your caregiver's directions for changing the gauze.  To avoid spreading the infection:  Keep your draining abscess covered with a bandage.  Wash your hands well.  Do not share personal care items, towels, or whirlpools with others.  Avoid skin contact with others.  Keep your skin and clothes clean around the abscess.  Keep all follow-up appointments as directed by your caregiver. SEEK MEDICAL CARE IF:   You have increased pain, swelling, redness, fluid drainage, or bleeding.  You have muscle aches, chills, or a general ill feeling.  You have a fever. MAKE SURE YOU:   Understand these instructions.  Will watch your condition.  Will get help right away if you are not doing well or get worse. Document Released: 03/16/2005 Document Revised: 12/06/2011 Document Reviewed: 08/19/2011 [facility] Patient Information 2014 [place], [place].

## 2013-05-22 LAB — AEROBIC CULTURE

## 2013-10-03 ENCOUNTER — Encounter: Payer: Self-pay | Admitting: Gynecology

## 2013-10-03 ENCOUNTER — Ambulatory Visit (INDEPENDENT_AMBULATORY_CARE_PROVIDER_SITE_OTHER): Payer: BC Managed Care – PPO | Admitting: Gynecology

## 2013-10-03 VITALS — BP 130/76 | Ht 63.0 in | Wt 277.0 lb

## 2013-10-03 DIAGNOSIS — Z01419 Encounter for gynecological examination (general) (routine) without abnormal findings: Secondary | ICD-10-CM

## 2013-10-03 DIAGNOSIS — N926 Irregular menstruation, unspecified: Secondary | ICD-10-CM

## 2013-10-03 MED ORDER — MEDROXYPROGESTERONE ACETATE 10 MG PO TABS: 10.0000 mg | ORAL_TABLET | Freq: Every day | ORAL | Status: DC

## 2013-10-03 NOTE — Patient Instructions (Signed)
Continue to try to stop smoking. Take Provera 10 mg tablets for 14 days every 6-8 weeks if without menses. Call if prolonged very typical bleeding. Followup in one year for annual exam, sooner if any issues.  Health Maintenance, Female A healthy lifestyle and preventative care can promote health and wellness.  Maintain regular health, dental, and eye exams.  Eat a healthy diet. Foods like vegetables, fruits, whole grains, low-fat dairy products, and lean protein foods contain the nutrients you need without too many calories. Decrease your intake of foods high in solid fats, added sugars, and salt. Get information about a proper diet from your caregiver, if necessary.  Regular physical exercise is one of the most important things you can do for your health. Most adults should get at least 150 minutes of moderate-intensity exercise (any activity that increases your heart rate and causes you to sweat) each week. In addition, most adults need muscle-strengthening exercises on 2 or more days a week.   Maintain a healthy weight. The body mass index (BMI) is a screening tool to identify possible weight problems. It provides an estimate of body fat based on height and weight. Your caregiver can help determine your BMI, and can help you achieve or maintain a healthy weight. For adults 20 years and older:  A BMI below 18.5 is considered underweight.  A BMI of 18.5 to 24.9 is normal.  A BMI of 25 to 29.9 is considered overweight.  A BMI of 30 and above is considered obese.  Maintain normal blood lipids and cholesterol by exercising and minimizing your intake of saturated fat. Eat a balanced diet with plenty of fruits and vegetables. Blood tests for lipids and cholesterol should begin at age 81 and be repeated every 5 years. If your lipid or cholesterol levels are high, you are over 50, or you are a high risk for heart disease, you may need your cholesterol levels checked more frequently.Ongoing high  lipid and cholesterol levels should be treated with medicines if diet and exercise are not effective.  If you smoke, find out from your caregiver how to quit. If you do not use tobacco, do not start.  Lung cancer screening is recommended for adults aged 33 80 years who are at high risk for developing lung cancer because of a history of smoking. Yearly low-dose computed tomography (CT) is recommended for people who have at least a 30-pack-year history of smoking and are a current smoker or have quit within the past 15 years. A pack year of smoking is smoking an average of 1 pack of cigarettes a day for 1 year (for example: 1 pack a day for 30 years or 2 packs a day for 15 years). Yearly screening should continue until the smoker has stopped smoking for at least 15 years. Yearly screening should also be stopped for people who develop a health problem that would prevent them from having lung cancer treatment.  If you are pregnant, do not drink alcohol. If you are breastfeeding, be very cautious about drinking alcohol. If you are not pregnant and choose to drink alcohol, do not exceed 1 drink per day. One drink is considered to be 12 ounces (355 mL) of beer, 5 ounces (148 mL) of wine, or 1.5 ounces (44 mL) of liquor.  Avoid use of street drugs. Do not share needles with anyone. Ask for help if you need support or instructions about stopping the use of drugs.  High blood pressure causes heart disease and increases the  risk of stroke. Blood pressure should be checked at least every 1 to 2 years. Ongoing high blood pressure should be treated with medicines, if weight loss and exercise are not effective.  If you are 8 to 47 years old, ask your caregiver if you should take aspirin to prevent strokes.  Diabetes screening involves taking a blood sample to check your fasting blood sugar level. This should be done once every 3 years, after age 25, if you are within normal weight and without risk factors for  diabetes. Testing should be considered at a younger age or be carried out more frequently if you are overweight and have at least 1 risk factor for diabetes.  Breast cancer screening is essential preventative care for women. You should practice "breast self-awareness." This means understanding the normal appearance and feel of your breasts and may include breast self-examination. Any changes detected, no matter how small, should be reported to a caregiver. Women in their 45s and 30s should have a clinical breast exam (CBE) by a caregiver as part of a regular health exam every 1 to 3 years. After age 64, women should have a CBE every year. Starting at age 65, women should consider having a mammogram (breast X-ray) every year. Women who have a family history of breast cancer should talk to their caregiver about genetic screening. Women at a high risk of breast cancer should talk to their caregiver about having an MRI and a mammogram every year.  Breast cancer gene (BRCA)-related cancer risk assessment is recommended for women who have family members with BRCA-related cancers. BRCA-related cancers include breast, ovarian, tubal, and peritoneal cancers. Having family members with these cancers may be associated with an increased risk for harmful changes (mutations) in the breast cancer genes BRCA1 and BRCA2. Results of the assessment will determine the need for genetic counseling and BRCA1 and BRCA2 testing.  The Pap test is a screening test for cervical cancer. Women should have a Pap test starting at age 34. Between ages 49 and 22, Pap tests should be repeated every 2 years. Beginning at age 34, you should have a Pap test every 3 years as long as the past 3 Pap tests have been normal. If you had a hysterectomy for a problem that was not cancer or a condition that could lead to cancer, then you no longer need Pap tests. If you are between ages 35 and 53, and you have had normal Pap tests going back 10 years, you  no longer need Pap tests. If you have had past treatment for cervical cancer or a condition that could lead to cancer, you need Pap tests and screening for cancer for at least 20 years after your treatment. If Pap tests have been discontinued, risk factors (such as a new sexual partner) need to be reassessed to determine if screening should be resumed. Some women have medical problems that increase the chance of getting cervical cancer. In these cases, your caregiver may recommend more frequent screening and Pap tests.  The human papillomavirus (HPV) test is an additional test that may be used for cervical cancer screening. The HPV test looks for the virus that can cause the cell changes on the cervix. The cells collected during the Pap test can be tested for HPV. The HPV test could be used to screen women aged 21 years and older, and should be used in women of any age who have unclear Pap test results. After the age of 23, women should have  HPV testing at the same frequency as a Pap test.  Colorectal cancer can be detected and often prevented. Most routine colorectal cancer screening begins at the age of 54 and continues through age 77. However, your caregiver may recommend screening at an earlier age if you have risk factors for colon cancer. On a yearly basis, your caregiver may provide home test kits to check for hidden blood in the stool. Use of a small camera at the end of a tube, to directly examine the colon (sigmoidoscopy or colonoscopy), can detect the earliest forms of colorectal cancer. Talk to your caregiver about this at age 40, when routine screening begins. Direct examination of the colon should be repeated every 5 to 10 years through age 68, unless early forms of pre-cancerous polyps or small growths are found.  Hepatitis C blood testing is recommended for all people born from [birth year] through 1963 and any individual with known risks for hepatitis C.  Practice safe sex. Use condoms and avoid  high-risk sexual practices to reduce the spread of sexually transmitted infections (STIs). Sexually active women aged 35 and younger should be checked for Chlamydia, which is a common sexually transmitted infection. Older women with new or multiple partners should also be tested for Chlamydia. Testing for other STIs is recommended if you are sexually active and at increased risk.  Osteoporosis is a disease in which the bones lose minerals and strength with aging. This can result in serious bone fractures. The risk of osteoporosis can be identified using a bone density scan. Women ages 18 and over and women at risk for fractures or osteoporosis should discuss screening with their caregivers. Ask your caregiver whether you should be taking a calcium supplement or vitamin D to reduce the rate of osteoporosis.  Menopause can be associated with physical symptoms and risks. Hormone replacement therapy is available to decrease symptoms and risks. You should talk to your caregiver about whether hormone replacement therapy is right for you.  Use sunscreen. Apply sunscreen liberally and repeatedly throughout the day. You should seek shade when your shadow is shorter than you. Protect yourself by wearing long sleeves, pants, a wide-brimmed hat, and sunglasses year round, whenever you are outdoors.  Notify your caregiver of new moles or changes in moles, especially if there is a change in shape or color. Also notify your caregiver if a mole is larger than the size of a pencil eraser.  Stay current with your immunizations. Document Released: 12/20/2010 Document Revised: 10/01/2012 Document Reviewed: 12/20/2010 [facility] Patient Information 2014 [place].

## 2013-10-03 NOTE — Progress Notes (Signed)
Sheena [name] 01/07/1966 [id number]        48 y.o.  G1P1001 for annual exam.  Several issues noted below.  Past medical history,surgical history, problem list, medications, allergies, family history and social history were all reviewed and documented as reviewed in the EPIC chart.  ROS:  12 system ROS performed with pertinent positives and negatives included in the history, assessment and plan.  Included Systems: General, HEENT, Neck, Cardiovascular, Pulmonary, Gastrointestinal, Genitourinary, Musculoskeletal, Dermatologic, Endocrine, Hematological, Neurologic, Psychiatric Additional significant findings : Irregular menses   Exam: Kim assistant Filed Vitals:   10/03/13 1506  BP: 130/76  Height: 5\' 3"  (1.6 m)  Weight: 277 lb (125.646 kg)   General appearance:  Normal affect, orientation and appearance. Skin: Grossly normal HEENT: Normal without gross oral lesions, cervical or supraclavicular adenopathy. Thyroid normal.  Ears/nose appear normal Lungs:  Clear without wheezing, rales or rhonchi Cardiac: RR, without RMG Abdominal:  Soft, nontender, without masses, guarding, rebound, organomegaly or hernia Breasts:  Examined lying and sitting without masses, retractions, discharge or axillary adenopathy. Pelvic:  Ext/BUS/vagina normal  Cervix normal  Uterus grossly normal, difficult to palpate. No tenderness    Adnexa  Without gross masses or tenderness    Anus and perineum  Normal   Rectovaginal  Normal sphincter tone without palpated masses or tenderness.    Assessment/Plan:  48 y.o. G34P1001 female for annual exam irregular menses, abstinence birth control.   1. Irregular menses. Patient states that her menses are every 6-8 weeks. Regular flow without intermenstrual bleeding. Has been doing this over the past several years. TSH FSH prolactin last year normal. Options for management reviewed to include hormonal manipulation/Mirena IUD. Patient not interested in IUD. Is not sexually  active. We'll plan Provera 10 mg x14 days every 6-8 weeks if without menses. Refill x6 provided. Call if prolonged or atypical bleeding. If becomes sexually active need to make sure she is not pregnant before taking the Provera. 2. Contraceptive management. Was using condoms but currently is not sexually active. Does not want IUD or other contraception at this time. We'll plan condoms if she does become sexually active. 3. Pap/HPV negative 07/2012. No Pap smear done today. No history of significant abnormal Pap smears. Repeat Pap smear in 3-5 year interval. 4. Mammography overdue. Strongly recommended patient schedule and she agrees to do so. SBE monthly reviewed. 5. Health maintenance. No blood work done as it is all done through her primary physician's office. Followup one year, sooner as needed.   Note: This document was prepared with digital dictation and possible smart phrase technology. Any transcriptional errors that result from this process are unintentional.   Anastasio Auerbach MD, 3:50 PM 10/03/2013

## 2013-10-04 LAB — URINALYSIS W MICROSCOPIC + REFLEX CULTURE
BILIRUBIN URINE: NEGATIVE
Bacteria, UA: NONE SEEN
CRYSTALS: NONE SEEN
Casts: NONE SEEN
GLUCOSE, UA: NEGATIVE mg/dL
Hgb urine dipstick: NEGATIVE
Ketones, ur: NEGATIVE mg/dL
Leukocytes, UA: NEGATIVE
Nitrite: NEGATIVE
Protein, ur: NEGATIVE mg/dL
SPECIFIC GRAVITY, URINE: 1.006 (ref 1.005–1.030)
SQUAMOUS EPITHELIAL / LPF: NONE SEEN
UROBILINOGEN UA: 0.2 mg/dL (ref 0.0–1.0)
pH: 6 (ref 5.0–8.0)

## 2013-10-08 ENCOUNTER — Other Ambulatory Visit: Payer: Self-pay | Admitting: Gynecology

## 2013-10-23 ENCOUNTER — Telehealth: Payer: Self-pay | Admitting: *Deleted

## 2013-10-23 NOTE — Telephone Encounter (Signed)
Pt was given provera on 10/03/13 OV to start cycle, no cycle yet. Pt last pill was on 10/20/13, I explained to pt it could take up to 2 weeks from last pill for cycle to start. Pt will call back if no cycle in 2 weeks.

## 2013-11-04 ENCOUNTER — Telehealth: Payer: Self-pay | Admitting: *Deleted

## 2013-11-04 NOTE — Telephone Encounter (Signed)
(  pt aware you out of town) Pt was prescribed provera 10 mg daily x 10 days, had a very light cycle that lasted about 2 days. Pt asked if this okay cause her normal cycle would be heavy lasting 4-5 days. I told pt this my be fine, but I would check. Please advise

## 2013-11-05 NOTE — Telephone Encounter (Signed)
Left on pt voicemail the below note. 

## 2013-11-05 NOTE — Telephone Encounter (Signed)
They can alternate like that.  As long as she has some bleeding after the provera

## 2013-12-19 ENCOUNTER — Other Ambulatory Visit: Payer: Self-pay | Admitting: Gynecology

## 2014-02-04 ENCOUNTER — Encounter: Payer: Self-pay | Admitting: Gastroenterology

## 2014-02-05 ENCOUNTER — Encounter: Payer: Self-pay | Admitting: Gynecology

## 2014-02-20 ENCOUNTER — Ambulatory Visit (INDEPENDENT_AMBULATORY_CARE_PROVIDER_SITE_OTHER): Payer: BC Managed Care – PPO | Admitting: Gastroenterology

## 2014-02-20 ENCOUNTER — Encounter: Payer: Self-pay | Admitting: Gastroenterology

## 2014-02-20 VITALS — BP 112/68 | HR 100 | Ht 63.0 in | Wt 276.6 lb

## 2014-02-20 DIAGNOSIS — D649 Anemia, unspecified: Secondary | ICD-10-CM | POA: Insufficient documentation

## 2014-02-20 DIAGNOSIS — K219 Gastro-esophageal reflux disease without esophagitis: Secondary | ICD-10-CM

## 2014-02-20 DIAGNOSIS — D509 Iron deficiency anemia, unspecified: Secondary | ICD-10-CM | POA: Insufficient documentation

## 2014-02-20 MED ORDER — MOVIPREP 100 G PO SOLR: 1.0000 | Freq: Once | ORAL | Status: DC

## 2014-02-20 NOTE — Progress Notes (Signed)
02/20/2014 Sheena [name] [id number] [birth year]   HISTORY OF PRESENT ILLNESS:  This is a pleasant 48 year old female who presents to our office today at the request of Dr. Forde Dandy for evaluation regarding IDA.  She says that she had recent labs performed and was found to be anemic.  Hgb was 8.6 grams; no prior for comparison.  Ferritin was low at 4, total iron low at 27, TIBC high at 485, and iron % saturation is 6%.  She says that she was recently placed on iron supplements.  She denies seeing black or bloody stools.  She says that for the past year her periods have been erratic and not occuring monthly and also are about regular flow, but one year ago they were very very heavy.  She does admit to long-standing issues with heartburn/reflux.  She takes prilosec daily, which does help but she says that if she misses one day of the medication then she "pays for it" for two or three days.    Past Medical History  Diagnosis Date  . STD (sexually transmitted disease)     HSV ll  . Thyroid disease     Hypothyroid  . IC (interstitial cystitis)   . Depression   . High cholesterol   . GERD (gastroesophageal reflux disease)   . Anemia    Past Surgical History  Procedure Laterality Date  . Cesarean section    . Foot surgery      reports that she has been smoking.  She has never used smokeless tobacco. She reports that she does not drink alcohol or use illicit drugs. family history includes Breast cancer (age of onset: 81) in her cousin; Colon polyps in her father; Crohn's disease in her father; Diabetes in her father; Hypertension in her father; Melanoma in her father; Rheum arthritis in her mother. Allergies  Allergen Reactions  . Sulfa Antibiotics Swelling      Outpatient Encounter Prescriptions as of 02/20/2014  Medication Sig  . ALPRAZolam (XANAX) 0.5 MG tablet   . DULoxetine (CYMBALTA) 30 MG capsule TAKE ONE CAPSULE BY MOUTH ONCE DAILY  . FENOFIBRATE PO Take by mouth.  . iron  polysaccharides (NIFEREX) 150 MG capsule Take 150 mg by mouth daily.  . Levothyroxine Sodium (SYNTHROID PO) Take 175 mcg by mouth.   . Omega-3 Fatty Acids (FISH OIL) 1200 MG CAPS Take 1 capsule by mouth daily.  . Omeprazole (PRILOSEC PO) Take by mouth.  . TOPIRAMATE PO Take by mouth.  . traZODone (DESYREL) 50 MG tablet   . Vitamin D, Ergocalciferol, (DRISDOL) 50000 UNITS CAPS capsule   . medroxyPROGESTERone (PROVERA) 10 MG tablet Take 1 tablet (10 mg total) by mouth daily.  Marland Kitchen MOVIPREP 100 G SOLR Take 1 kit (200 g total) by mouth once.  . [DISCONTINUED] fluconazole (DIFLUCAN) 150 MG tablet TAKE ONE TABLET BY MOUTH AS A ONE-TIME DOSE     REVIEW OF SYSTEMS  : All other systems reviewed and negative except where noted in the History of Present Illness.   PHYSICAL EXAM: BP 112/68  Pulse 100  Ht _0  (1.6 m)  Wt 276 lb 9.6 oz (125.465 kg)  BMI 49.01 kg/m2  LMP 12/28/2013 General: Well developed white female in no acute distress Head: Normocephalic and atraumatic Eyes:  Sclerae anicteric, conjunctiva pink. Ears: Normal auditory acuity  Lungs: Clear throughout to auscultation Heart: Regular rate and rhythm Abdomen: Soft, obese, non-distended.  Normal bowel sounds.  Non-tender. Rectal:  Deferred.  Will be  done at the time of colonoscopy. Musculoskeletal: Symmetrical with no gross deformities  Skin: No lesions on visible extremities Extremities: No edema  Neurological: Alert oriented x 4, grossly non-focal Psychological:  Alert and cooperative. Normal mood and affect  ASSESSMENT AND PLAN: -Iron deficiency anemia:  Likely secondary to previously very heavy menstrual periods, however, will rule out GI source of blood loss as well.  Will schedule colonoscopy for further evaluation.  The risks, benefits, and alternatives were discussed with the patient and she consents to proceed. -GERD:  Long-standing issue.  Overall well-controlled on omeprazole daily.  Will schedule EGD as well for full  GI evaluation.  *Continue iron supplements and monitor for improvement in Hgb and iron levels.

## 2014-02-20 NOTE — Patient Instructions (Signed)
You have been scheduled for an endoscopy and colonoscopy with Dr. Fuller Plan. Please follow the written instructions given to you at your visit today. Please pick up your prep at the pharmacy within the next 1-3 days. If you use inhalers (even only as needed), please bring them with you on the day of your procedure. Your physician has requested that you go to www.startemmi.com and enter the access code given to you at your visit today. This web site gives a general overview about your procedure. However, you should still follow specific instructions given to you by our office regarding your preparation for the procedure.

## 2014-02-20 NOTE — Progress Notes (Signed)
Reviewed and agree with management plan.  Malcolm T. Stark, MD FACG 

## 2014-02-27 ENCOUNTER — Encounter: Payer: Self-pay | Admitting: Gastroenterology

## 2014-03-11 ENCOUNTER — Ambulatory Visit (AMBULATORY_SURGERY_CENTER): Payer: BC Managed Care – PPO | Admitting: Gastroenterology

## 2014-03-11 ENCOUNTER — Encounter: Payer: Self-pay | Admitting: Gastroenterology

## 2014-03-11 VITALS — BP 121/64 | HR 78 | Temp 98.1°F | Resp 19 | Ht 63.0 in | Wt 276.0 lb

## 2014-03-11 DIAGNOSIS — D129 Benign neoplasm of anus and anal canal: Secondary | ICD-10-CM

## 2014-03-11 DIAGNOSIS — D128 Benign neoplasm of rectum: Secondary | ICD-10-CM

## 2014-03-11 DIAGNOSIS — D125 Benign neoplasm of sigmoid colon: Secondary | ICD-10-CM

## 2014-03-11 DIAGNOSIS — D509 Iron deficiency anemia, unspecified: Secondary | ICD-10-CM

## 2014-03-11 DIAGNOSIS — D126 Benign neoplasm of colon, unspecified: Secondary | ICD-10-CM

## 2014-03-11 DIAGNOSIS — K219 Gastro-esophageal reflux disease without esophagitis: Secondary | ICD-10-CM

## 2014-03-11 MED ORDER — SODIUM CHLORIDE 0.9 % IV SOLN: 500.0000 mL | INTRAVENOUS | Status: DC

## 2014-03-11 NOTE — Progress Notes (Signed)
Called to room to assist during endoscopic procedure.  Patient ID and intended procedure confirmed with present staff. Received instructions for my participation in the procedure from the performing physician.  

## 2014-03-11 NOTE — Progress Notes (Signed)
Pt stable coughing suctioned awake responsive to RR

## 2014-03-11 NOTE — Op Note (Signed)
[place]  [address] [place], [id number]   ENDOSCOPY PROCEDURE REPORT  PATIENT: Sheena [name], Sheena [name]  MR#: [id number] BIRTHDATE: 06-06-1966 , 47  yrs. old GENDER: female ENDOSCOPIST: Ladene Artist, MD, [facility] REFERRED BY:  Reynold Bowen PROCEDURE DATE:  03/11/2014 PROCEDURE:  EGD w/ biopsy ASA CLASS:     Class II INDICATIONS:  iron deficiency anemia.   history of esophageal reflux. MEDICATIONS: Monitored anesthesia care, Propofol 150 mg, residual sedation present TOPICAL ANESTHETIC: DESCRIPTION OF PROCEDURE: After the risks benefits and alternatives of the procedure were thoroughly explained, informed consent was obtained.  The LB [id number] endoscope was introduced through the mouth and advanced to the second portion of the duodenum , limited by Without limitations. The instrument was slowly withdrawn as the mucosa was fully examined.    ESOPHAGUS: The mucosa of the esophagus appeared normal. STOMACH: The mucosa and folds of the stomach appeared normal. DUODENUM: The duodenal mucosa showed no abnormalities in the bulb and 2nd part of the duodenum.  Cold forceps biopsies were taken in the bulb and second portion. Retroflexed views revealed a small hiatal hernia.    The scope was then withdrawn from the patient and the procedure completed.  COMPLICATIONS: There were no complications.  ENDOSCOPIC IMPRESSION: 1.   The EGD appeared normal except for a small hiatal hernia  RECOMMENDATIONS: 1.  Await pathology results 2.  Antireflux measures 3.  Continue PPI daily 4.  Continue Fe replacement and follow up with Dr. Baldwin Crown  eSigned:  Ladene Artist, MD, [facility] 03/11/2014 3:02 PM

## 2014-03-11 NOTE — Op Note (Signed)
[place]  [address] [place], [id number]   COLONOSCOPY PROCEDURE REPORT  PATIENT: Sheena, [name]  MR#: [id number] BIRTHDATE: 1965-07-27 , 47  yrs. old GENDER: female ENDOSCOPIST: Ladene Artist, MD, [facility] REFERRED [id number], Annie Main PROCEDURE DATE:  03/11/2014 PROCEDURE:   Colonoscopy with biopsy and Colonoscopy with snare polypectomy First Screening Colonoscopy - Avg.  risk and is 50 yrs.  old or older - No.  Prior Negative Screening - Now for repeat screening. N/A  History of Adenoma - Now for follow-up colonoscopy & has been > or = to 3 yrs.  N/A  Polyps Removed Today? Yes. ASA CLASS:   Class II INDICATIONS:iron deficiency anemia. MEDICATIONS: Monitored anesthesia care and Propofol 300 mg DESCRIPTION OF PROCEDURE:   After the risks benefits and alternatives of the procedure were thoroughly explained, informed consent was obtained.  revealed no abnormalities of the rectum. The LB [id number]  endoscope was introduced through the anus and advanced to the cecum, which was identified by both the appendix and ileocecal valve. No adverse events experienced.   The quality of the prep was excellent, using MoviPrep  The instrument was then slowly withdrawn as the colon was fully examined.  COLON FINDINGS: Two sessile polyps measuring 6 mm in size were found in the rectum and sigmoid colon.  A polypectomy was performed with a cold snare.  The resection was complete, the polyp tissue was completely retrieved and sent to histology.   A sessile polyp measuring 3 mm in size was found in the rectum.  A polypectomy was performed with cold forceps.  The resection was complete, the polyp tissue was completely retrieved and sent to histology.   The colon mucosa was otherwise normal.  Retroflexed views revealed no abnormalities. The time to cecum=1 minutes 24 seconds.  Withdrawal time=12 minutes 32 seconds.  The scope was withdrawn and the procedure  completed.  COMPLICATIONS: There were no complications.   ENDOSCOPIC IMPRESSION: 1.   Two sessile polyps in the rectum and sigmoid colon; polypectomy performed with a cold snare 2.   Sessile polyp in the rectum; polypectomy performed with cold forceps 3.   The colon mucosa was otherwise normal  RECOMMENDATIONS: 1.  Await pathology results 2.  Repeat colonoscopy in 5 years if polyp(s) adenomatous; otherwise 10 years  eSigned:  Ladene Artist, MD, [facility] 03/11/2014 2:49 PM      PATIENT NAME:  Sheena, [name] MR#: [id number]

## 2014-03-11 NOTE — Patient Instructions (Signed)
Impressions/recommendations:  Polyps (handout given) Repeat colonoscopy pending pathology results.  Hiatal hernia Continue PPI daily Continue iron replacement and follow up with Dr. Forde Dandy.  YOU HAD AN ENDOSCOPIC PROCEDURE TODAY AT [facility]: Refer to the procedure report that was given to you for any specific questions about what was found during the examination.  If the procedure report does not answer your questions, please call your gastroenterologist to clarify.  If you requested that your care partner not be given the details of your procedure findings, then the procedure report has been included in a sealed envelope for you to review at your convenience later.  YOU SHOULD EXPECT: Some feelings of bloating in the abdomen. Passage of more gas than usual.  Walking can help get rid of the air that was put into your GI tract during the procedure and reduce the bloating. If you had a lower endoscopy (such as a colonoscopy or flexible sigmoidoscopy) you may notice spotting of blood in your stool or on the toilet paper. If you underwent a bowel prep for your procedure, then you may not have a normal bowel movement for a few days.  DIET: Your first meal following the procedure should be a light meal and then it is ok to progress to your normal diet.  A half-sandwich or bowl of soup is an example of a good first meal.  Heavy or fried foods are harder to digest and may make you feel nauseous or bloated.  Likewise meals heavy in dairy and vegetables can cause extra gas to form and this can also increase the bloating.  Drink plenty of fluids but you should avoid alcoholic beverages for 24 hours.  ACTIVITY: Your care partner should take you home directly after the procedure.  You should plan to take it easy, moving slowly for the rest of the day.  You can resume normal activity the day after the procedure however you should NOT DRIVE or use heavy machinery for 24 hours (because of the  sedation medicines used during the test).    SYMPTOMS TO REPORT IMMEDIATELY: A gastroenterologist can be reached at any hour.  During normal business hours, 8:30 AM to 5:00 PM Monday through Friday, call [phone].  After hours and on weekends, please call the GI answering service at [phone] who will take a message and have the physician on call contact you.   Following lower endoscopy (colonoscopy or flexible sigmoidoscopy):  Excessive amounts of blood in the stool  Significant tenderness or worsening of abdominal pains  Swelling of the abdomen that is new, acute  Fever of 100F or higher  Following upper endoscopy (EGD)  Vomiting of blood or coffee ground material  New chest pain or pain under the shoulder blades  Painful or persistently difficult swallowing  New shortness of breath  Fever of 100F or higher  Black, tarry-looking stools  FOLLOW UP: If any biopsies were taken you will be contacted by phone or by letter within the next 1-3 weeks.  Call your gastroenterologist if you have not heard about the biopsies in 3 weeks.  Our staff will call the home number listed on your records the next business day following your procedure to check on you and address any questions or concerns that you may have at that time regarding the information given to you following your procedure. This is a courtesy call and so if there is no answer at the home number and we have not heard from you  through the emergency physician on call, we will assume that you have returned to your regular daily activities without incident.  SIGNATURES/CONFIDENTIALITY: You and/or your care partner have signed paperwork which will be entered into your electronic medical record.  These signatures attest to the fact that that the information above on your After Visit Summary has been reviewed and is understood.  Full responsibility of the confidentiality of this discharge information lies with you and/or your  care-partner.

## 2014-03-12 ENCOUNTER — Telehealth: Payer: Self-pay | Admitting: *Deleted

## 2014-03-12 NOTE — Telephone Encounter (Signed)
  Follow up Call-  Call back number 03/11/2014  Post procedure Call Back phone  # [phone]  Permission to leave phone message Yes     Patient questions:  Do you have a fever, pain , or abdominal swelling? No. Pain Score  0 *  Have you tolerated food without any problems? Yes.    Have you been able to return to your normal activities? Yes.    Do you have any questions about your discharge instructions: Diet   No. Medications  No. Follow up visit  No.  Do you have questions or concerns about your Care? No.  Actions: * If pain score is 4 or above: No action needed, pain <4.

## 2014-03-17 ENCOUNTER — Encounter: Payer: Self-pay | Admitting: Gastroenterology

## 2014-04-14 ENCOUNTER — Ambulatory Visit: Payer: BC Managed Care – PPO | Admitting: Internal Medicine

## 2014-04-21 ENCOUNTER — Encounter: Payer: Self-pay | Admitting: Gastroenterology

## 2014-10-28 ENCOUNTER — Other Ambulatory Visit: Payer: Self-pay | Admitting: Gynecology

## 2014-10-29 ENCOUNTER — Other Ambulatory Visit: Payer: Self-pay | Admitting: Gynecology

## 2014-10-30 ENCOUNTER — Telehealth: Payer: Self-pay | Admitting: *Deleted

## 2014-10-30 MED ORDER — DULOXETINE HCL 30 MG PO CPEP: 30.0000 mg | ORAL_CAPSULE | Freq: Every day | ORAL | Status: DC

## 2014-10-30 NOTE — Telephone Encounter (Signed)
Okay to refill through annual

## 2014-10-30 NOTE — Telephone Encounter (Signed)
Pt has annual scheduled on 12/17/14 requesting refill on Cymbalta 30 mg, last seen on 10/03/13. Okay to fill?

## 2014-10-30 NOTE — Telephone Encounter (Signed)
Left on voicemail Rx sent 

## 2014-12-02 ENCOUNTER — Other Ambulatory Visit: Payer: Self-pay | Admitting: Gynecology

## 2014-12-17 ENCOUNTER — Ambulatory Visit (INDEPENDENT_AMBULATORY_CARE_PROVIDER_SITE_OTHER): Payer: BLUE CROSS/BLUE SHIELD | Admitting: Gynecology

## 2014-12-17 ENCOUNTER — Encounter: Payer: Self-pay | Admitting: Gynecology

## 2014-12-17 VITALS — BP 120/78 | Ht 62.5 in | Wt 275.0 lb

## 2014-12-17 DIAGNOSIS — N926 Irregular menstruation, unspecified: Secondary | ICD-10-CM

## 2014-12-17 DIAGNOSIS — Z01419 Encounter for gynecological examination (general) (routine) without abnormal findings: Secondary | ICD-10-CM | POA: Diagnosis not present

## 2014-12-17 MED ORDER — MEDROXYPROGESTERONE ACETATE 10 MG PO TABS: 10.0000 mg | ORAL_TABLET | Freq: Every day | ORAL | Status: DC

## 2014-12-17 NOTE — Progress Notes (Signed)
YARITZEL STANGE June 17, 1966 [id number]        49 y.o.  G1P1001 for annual exam.  Several issues noted below.  Past medical history,surgical history, problem list, medications, allergies, family history and social history were all reviewed and documented as reviewed in the EPIC chart.  ROS:  Performed with pertinent positives and negatives included in the history, assessment and plan.   Additional significant findings :  none   Exam: Kim Counsellor Vitals:   12/17/14 1517  BP: 120/78  Height: 5' 2.5" (1.588 m)  Weight: 275 lb (124.739 kg)   General appearance:  Normal affect, orientation and appearance. Skin: Grossly normal HEENT: Without gross lesions.  No cervical or supraclavicular adenopathy. Thyroid normal.  Lungs:  Clear without wheezing, rales or rhonchi Cardiac: RR, without RMG Abdominal:  Soft, nontender, without masses, guarding, rebound, organomegaly or hernia Breasts:  Examined lying and sitting without masses, retractions, discharge or axillary adenopathy. Pelvic:  Ext/BUS/vagina normal  Cervix normal  Uterus grossly normal. Exam limited by abdominal girth. No tenderness or masses.  Adnexa  Without gross masses or tenderness    Anus and perineum  Normal   Rectovaginal  Normal sphincter tone without palpated masses or tenderness.    Assessment/Plan:  49 y.o. G3P1001 female for annual exam with irregular menses, condom contraception.   1. Irregular menses. Patient has always had somewhat irregular menses. Will go 6-8 weeks without menses. Has used progesterone withdrawal if she goes 6 weeks without menses in the past. Intermittently she will have regular monthly menses and then space out. Her last 2 menses have been regular. I refilled her Provera 10 mg #14 with 6 refills to use at 6 weeks if without menses. She knows to check a UPT before taking. Prior hormone studies to include TSH prolactin FSH have been normal. 2. Contraception. Using condoms which is acceptable to  her. Failure risk and alternatives reviewed. 3. Pap smear/HPV negative 2014. No Pap smear done today.  No history of significant abnormal Pap smears previously. 4. Mammography 01/2014. Repeat mammogram this coming August. SBE monthly reviewed. 5. Colonoscopy 2015. Repeat at their recommended interval. 6. Health maintenance. No blood work done. Patient relates just having blood work done through her primary physician's office and awaiting the results. She was low on vitamin D last year at 22 taking 50,000 units weekly and also with a marginally low TSH. Her hemoglobin was 8 but reportedly repeated after iron supplementation and returned 10. I've asked her to forward a copy of her labs to me if she can. Relates that her menses are fairly regular flow is not heavy and have not changed over the years. Assuming she continues well with the intermittent progesterone withdrawal she'll see me in a year, sooner as needed.   Anastasio Auerbach MD, 3:43 PM 12/17/2014

## 2014-12-17 NOTE — Patient Instructions (Signed)
You may obtain a copy of any labs that were done today by logging onto MyChart as outlined in the instructions provided with your AVS (after visit summary). The office will not call with normal lab results but certainly if there are any significant abnormalities then we will contact you.   Health Maintenance, Female A healthy lifestyle and preventative care can promote health and wellness.  Maintain regular health, dental, and eye exams.  Eat a healthy diet. Foods like vegetables, fruits, whole grains, low-fat dairy products, and lean protein foods contain the nutrients you need without too many calories. Decrease your intake of foods high in solid fats, added sugars, and salt. Get information about a proper diet from your caregiver, if necessary.  Regular physical exercise is one of the most important things you can do for your health. Most adults should get at least 150 minutes of moderate-intensity exercise (any activity that increases your heart rate and causes you to sweat) each week. In addition, most adults need muscle-strengthening exercises on 2 or more days a week.   Maintain a healthy weight. The body mass index (BMI) is a screening tool to identify possible weight problems. It provides an estimate of body fat based on height and weight. Your caregiver can help determine your BMI, and can help you achieve or maintain a healthy weight. For adults 20 years and older:  A BMI below 18.5 is considered underweight.  A BMI of 18.5 to 24.9 is normal.  A BMI of 25 to 29.9 is considered overweight.  A BMI of 30 and above is considered obese.  Maintain normal blood lipids and cholesterol by exercising and minimizing your intake of saturated fat. Eat a balanced diet with plenty of fruits and vegetables. Blood tests for lipids and cholesterol should begin at age 61 and be repeated every 5 years. If your lipid or cholesterol levels are high, you are over 50, or you are a high risk for heart  disease, you may need your cholesterol levels checked more frequently.Ongoing high lipid and cholesterol levels should be treated with medicines if diet and exercise are not effective.  If you smoke, find out from your caregiver how to quit. If you do not use tobacco, do not start.  Lung cancer screening is recommended for adults aged 33 80 years who are at high risk for developing lung cancer because of a history of smoking. Yearly low-dose computed tomography (CT) is recommended for people who have at least a 30-pack-year history of smoking and are a current smoker or have quit within the past 15 years. A pack year of smoking is smoking an average of 1 pack of cigarettes a day for 1 year (for example: 1 pack a day for 30 years or 2 packs a day for 15 years). Yearly screening should continue until the smoker has stopped smoking for at least 15 years. Yearly screening should also be stopped for people who develop a health problem that would prevent them from having lung cancer treatment.  If you are pregnant, do not drink alcohol. If you are breastfeeding, be very cautious about drinking alcohol. If you are not pregnant and choose to drink alcohol, do not exceed 1 drink per day. One drink is considered to be 12 ounces (355 mL) of beer, 5 ounces (148 mL) of wine, or 1.5 ounces (44 mL) of liquor.  Avoid use of street drugs. Do not share needles with anyone. Ask for help if you need support or instructions about stopping  the use of drugs.  High blood pressure causes heart disease and increases the risk of stroke. Blood pressure should be checked at least every 1 to 2 years. Ongoing high blood pressure should be treated with medicines, if weight loss and exercise are not effective.  If you are 59 to 47 years old, ask your caregiver if you should take aspirin to prevent strokes.  Diabetes screening involves taking a blood sample to check your fasting blood sugar level. This should be done once every 3  years, after age 91, if you are within normal weight and without risk factors for diabetes. Testing should be considered at a younger age or be carried out more frequently if you are overweight and have at least 1 risk factor for diabetes.  Breast cancer screening is essential preventative care for women. You should practice "breast self-awareness." This means understanding the normal appearance and feel of your breasts and may include breast self-examination. Any changes detected, no matter how small, should be reported to a caregiver. Women in their 66s and 30s should have a clinical breast exam (CBE) by a caregiver as part of a regular health exam every 1 to 3 years. After age 101, women should have a CBE every year. Starting at age 100, women should consider having a mammogram (breast X-ray) every year. Women who have a family history of breast cancer should talk to their caregiver about genetic screening. Women at a high risk of breast cancer should talk to their caregiver about having an MRI and a mammogram every year.  Breast cancer gene (BRCA)-related cancer risk assessment is recommended for women who have family members with BRCA-related cancers. BRCA-related cancers include breast, ovarian, tubal, and peritoneal cancers. Having family members with these cancers may be associated with an increased risk for harmful changes (mutations) in the breast cancer genes BRCA1 and BRCA2. Results of the assessment will determine the need for genetic counseling and BRCA1 and BRCA2 testing.  The Pap test is a screening test for cervical cancer. Women should have a Pap test starting at age 57. Between ages 25 and 35, Pap tests should be repeated every 2 years. Beginning at age 37, you should have a Pap test every 3 years as long as the past 3 Pap tests have been normal. If you had a hysterectomy for a problem that was not cancer or a condition that could lead to cancer, then you no longer need Pap tests. If you are  between ages 50 and 76, and you have had normal Pap tests going back 10 years, you no longer need Pap tests. If you have had past treatment for cervical cancer or a condition that could lead to cancer, you need Pap tests and screening for cancer for at least 20 years after your treatment. If Pap tests have been discontinued, risk factors (such as a new sexual partner) need to be reassessed to determine if screening should be resumed. Some women have medical problems that increase the chance of getting cervical cancer. In these cases, your caregiver may recommend more frequent screening and Pap tests.  The human papillomavirus (HPV) test is an additional test that may be used for cervical cancer screening. The HPV test looks for the virus that can cause the cell changes on the cervix. The cells collected during the Pap test can be tested for HPV. The HPV test could be used to screen women aged 44 years and older, and should be used in women of any age  who have unclear Pap test results. After the age of 55, women should have HPV testing at the same frequency as a Pap test.  Colorectal cancer can be detected and often prevented. Most routine colorectal cancer screening begins at the age of 44 and continues through age 20. However, your caregiver may recommend screening at an earlier age if you have risk factors for colon cancer. On a yearly basis, your caregiver may provide home test kits to check for hidden blood in the stool. Use of a small camera at the end of a tube, to directly examine the colon (sigmoidoscopy or colonoscopy), can detect the earliest forms of colorectal cancer. Talk to your caregiver about this at age 86, when routine screening begins. Direct examination of the colon should be repeated every 5 to 10 years through age 13, unless early forms of pre-cancerous polyps or small growths are found.  Hepatitis C blood testing is recommended for all people born from [birth year] through 1963 and any  individual with known risks for hepatitis C.  Practice safe sex. Use condoms and avoid high-risk sexual practices to reduce the spread of sexually transmitted infections (STIs). Sexually active women aged 36 and younger should be checked for Chlamydia, which is a common sexually transmitted infection. Older women with new or multiple partners should also be tested for Chlamydia. Testing for other STIs is recommended if you are sexually active and at increased risk.  Osteoporosis is a disease in which the bones lose minerals and strength with aging. This can result in serious bone fractures. The risk of osteoporosis can be identified using a bone density scan. Women ages 20 and over and women at risk for fractures or osteoporosis should discuss screening with their caregivers. Ask your caregiver whether you should be taking a calcium supplement or vitamin D to reduce the rate of osteoporosis.  Menopause can be associated with physical symptoms and risks. Hormone replacement therapy is available to decrease symptoms and risks. You should talk to your caregiver about whether hormone replacement therapy is right for you.  Use sunscreen. Apply sunscreen liberally and repeatedly throughout the day. You should seek shade when your shadow is shorter than you. Protect yourself by wearing long sleeves, pants, a wide-brimmed hat, and sunglasses year round, whenever you are outdoors.  Notify your caregiver of new moles or changes in moles, especially if there is a change in shape or color. Also notify your caregiver if a mole is larger than the size of a pencil eraser.  Stay current with your immunizations. Document Released: 12/20/2010 Document Revised: 10/01/2012 Document Reviewed: 12/20/2010 [facility] Patient Information 2014 [employer].

## 2015-01-13 ENCOUNTER — Other Ambulatory Visit: Payer: Self-pay | Admitting: Gynecology

## 2015-04-30 ENCOUNTER — Telehealth: Payer: Self-pay | Admitting: *Deleted

## 2015-04-30 MED ORDER — VALACYCLOVIR HCL 500 MG PO TABS: ORAL_TABLET | ORAL | Status: DC

## 2015-04-30 NOTE — Telephone Encounter (Signed)
Okay for Valtrex 500 mg twice a day 5 days #10 with 3 refills

## 2015-04-30 NOTE — Telephone Encounter (Signed)
Left on voicemail Rx sent 

## 2015-04-30 NOTE — Telephone Encounter (Signed)
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 Please advise

## 2015-10-27 ENCOUNTER — Telehealth: Payer: Self-pay | Admitting: *Deleted

## 2015-10-27 NOTE — Telephone Encounter (Signed)
Pt informed the below note 

## 2015-10-27 NOTE — Telephone Encounter (Signed)
I would go ahead and do a course of Provera now after checking a pregnancy test. Then she can follow up with her annual exam end of June/July and we can evaluate at that time regardless of whether she has a period or not

## 2015-10-27 NOTE — Telephone Encounter (Signed)
Pt was prescribed Provera 10 mg #14 with 6 refills to use at 6 weeks if without menses annual in June 2016. Pt said she took the medication for 2 months and didn't have a cycle, I asked pt when was her last cycle she doesn't remember states maybe 7 months ago. Pt said her mother got very sick and was admitted to hospital several times and she totally forgot about herself. Should pt start on re-start provera? Or do you want order imaging/OV?  Please advise

## 2015-12-18 ENCOUNTER — Ambulatory Visit (INDEPENDENT_AMBULATORY_CARE_PROVIDER_SITE_OTHER): Payer: BLUE CROSS/BLUE SHIELD | Admitting: Gynecology

## 2015-12-18 ENCOUNTER — Encounter: Payer: Self-pay | Admitting: Gynecology

## 2015-12-18 VITALS — BP 120/76 | Ht 63.0 in | Wt 261.0 lb

## 2015-12-18 DIAGNOSIS — N926 Irregular menstruation, unspecified: Secondary | ICD-10-CM | POA: Diagnosis not present

## 2015-12-18 DIAGNOSIS — Z01419 Encounter for gynecological examination (general) (routine) without abnormal findings: Secondary | ICD-10-CM | POA: Diagnosis not present

## 2015-12-18 LAB — CBC WITH DIFFERENTIAL/PLATELET
BASOS PCT: 1 %
Basophils Absolute: 103 cells/uL (ref 0–200)
Eosinophils Absolute: 206 cells/uL (ref 15–500)
Eosinophils Relative: 2 %
HCT: 38.4 % (ref 35.0–45.0)
Hemoglobin: 12.2 g/dL (ref 11.7–15.5)
LYMPHS PCT: 34 %
Lymphs Abs: 3502 cells/uL (ref 850–3900)
MCH: 24.5 pg — ABNORMAL LOW (ref 27.0–33.0)
MCHC: 31.8 g/dL — ABNORMAL LOW (ref 32.0–36.0)
MCV: 77.3 fL — ABNORMAL LOW (ref 80.0–100.0)
MPV: 11.7 fL (ref 7.5–12.5)
Monocytes Absolute: 824 cells/uL (ref 200–950)
Monocytes Relative: 8 %
NEUTROS PCT: 55 %
Neutro Abs: 5665 cells/uL (ref 1500–7800)
PLATELETS: 393 10*3/uL (ref 140–400)
RBC: 4.97 MIL/uL (ref 3.80–5.10)
RDW: 16 % — AB (ref 11.0–15.0)
WBC: 10.3 10*3/uL (ref 3.8–10.8)

## 2015-12-18 MED ORDER — MEDROXYPROGESTERONE ACETATE 10 MG PO TABS: 10.0000 mg | ORAL_TABLET | Freq: Every day | ORAL | Status: DC

## 2015-12-18 NOTE — Progress Notes (Signed)
    Sheena [name] May 10, 1966 [id number]        50 y.o.  G1P1001  for annual exam.  Several issues noted below  Past medical history,surgical history, problem list, medications, allergies, family history and social history were all reviewed and documented as reviewed in the EPIC chart.  ROS:  Performed with pertinent positives and negatives included in the history, assessment and plan.   Additional significant findings :  None   Exam: Caryn Bee assistant Filed Vitals:   12/18/15 1417  BP: 120/76  Height: 5\' 3"  (1.6 m)  Weight: 261 lb (118.389 kg)   General appearance:  Normal affect, orientation and appearance. Skin: Grossly normal HEENT: Without gross lesions.  No cervical or supraclavicular adenopathy. Thyroid normal.  Lungs:  Clear without wheezing, rales or rhonchi Cardiac: RR, without RMG Abdominal:  Soft, nontender, without masses, guarding, rebound, organomegaly or hernia Breasts:  Examined lying and sitting without masses, retractions, discharge or axillary adenopathy. Pelvic:  Ext/BUS/vagina normal  Cervix normal  Uterus difficult to palpate but no gross masses or tenderness  Adnexa without masses or tenderness    Anus and perineum normal   Rectovaginal normal sphincter tone without palpated masses or tenderness.    Assessment/Plan:  50 y.o. G81P1001 female for annual exam with irregular menses, abstinent birth control.   1. Irregular menses. Patient does have skips in her menses consistent with ovulatory dysfunction. She has a prescription for Provera and takes it if she does not have a period every other month. She has regular bleeding afterwards with no prolonged or atypical bleeding. Has her thyroid followed by Dr. Forde Dandy. Will check FSH and CBC today. Has been anemic in the past. Otherwise I refilled her Provera 10 mg 14 days every other month if without spontaneous menses. Follow up if prolonged or atypical bleeding. 2. Mammography overdo and scheduled this coming  Monday. Patient knows to follow up for this. SBE monthly reviewed. 3. Pap smear/HPV 07/2012 negative. No Pap smear done today. No history of significant abnormal Pap smears previously. 4. Colonoscopy 2015. Repeat at their recommended interval. 5. Health maintenance. No routine blood work done as patient reports this done elsewhere. Follow up 1 year, sooner as needed.   Anastasio Auerbach MD, 2:47 PM 12/18/2015

## 2015-12-18 NOTE — Patient Instructions (Signed)

## 2015-12-19 LAB — FOLLICLE STIMULATING HORMONE: FSH: 46.5 m[IU]/mL

## 2016-01-06 ENCOUNTER — Encounter: Payer: Self-pay | Admitting: Gynecology

## 2016-01-22 ENCOUNTER — Other Ambulatory Visit: Payer: Self-pay | Admitting: Gynecology

## 2016-01-25 NOTE — Telephone Encounter (Signed)
Last prescribed in 2016, annual was in June.

## 2017-01-31 ENCOUNTER — Encounter: Payer: Self-pay | Admitting: Gynecology

## 2017-02-13 ENCOUNTER — Ambulatory Visit (INDEPENDENT_AMBULATORY_CARE_PROVIDER_SITE_OTHER): Payer: Commercial Managed Care - PPO | Admitting: Gynecology

## 2017-02-13 ENCOUNTER — Encounter: Payer: Self-pay | Admitting: Gynecology

## 2017-02-13 VITALS — BP 130/80 | Ht 62.5 in | Wt 272.0 lb

## 2017-02-13 DIAGNOSIS — N926 Irregular menstruation, unspecified: Secondary | ICD-10-CM

## 2017-02-13 DIAGNOSIS — Z01419 Encounter for gynecological examination (general) (routine) without abnormal findings: Secondary | ICD-10-CM

## 2017-02-13 DIAGNOSIS — Z1151 Encounter for screening for human papillomavirus (HPV): Secondary | ICD-10-CM

## 2017-02-13 NOTE — Progress Notes (Signed)
    Sheena [name] 04-02-1966 [id number]        51 y.o.  G1P1001 for annual exam.  Has had several menses yearly usually requiring Provera withdrawal. Most recently though did not have bleeding after her Provera. Did have a spontaneous menses in August. Having some hot flushes and sweats but not overly bothersome to the patient. [place] 51 last year.  Past medical history,surgical history, problem list, medications, allergies, family history and social history were all reviewed and documented as reviewed in the EPIC chart.  ROS:  Performed with pertinent positives and negatives included in the history, assessment and plan.   Additional significant findings :  None   Exam: Caryn Bee assistant Vitals:   02/13/17 1502  BP: 130/80  Weight: 272 lb (123.4 kg)  Height: 5' 2.5" (1.588 m)   Body mass index is 48.96 kg/m.  General appearance:  Normal affect, orientation and appearance. Skin: Grossly normal HEENT: Without gross lesions.  No cervical or supraclavicular adenopathy. Thyroid normal.  Lungs:  Clear without wheezing, rales or rhonchi Cardiac: RR, without RMG Abdominal:  Soft, nontender, without masses, guarding, rebound, organomegaly or hernia Breasts:  Examined lying and sitting without masses, retractions, discharge or axillary adenopathy. Pelvic:  Ext, BUS, Vagina: Normal  Cervix: Normal. Pap smear/HPV  Uterus: Difficult to palpate but no gross masses or tenderness  Adnexa: Without gross masses or tenderness    Anus and perineum: Normal   Rectovaginal: Normal sphincter tone without palpated masses or tenderness.    Assessment/Plan:  51 y.o. G82P1001 female for annual exam, abstinent birth control.   1. Perimenopausal. Irregular occasional menses. Was using Provera withdrawal but did not withdraw with Provera. Had a spontaneous menses in August. [facility] last year elevated. Recommend menstrual calendar for now. As long as less frequent but regular menses when they occur them will  follow. If significant irregularity or goes more than 1 year without bleeding and then bleeds patient knows to call. If significant hot flushes night sweats or other menopausal symptoms she'll represent to discuss treatment options but at this point is not interested in HRT. 2. Pap smear/HPV 07/2012. Pap smear/HPV today. No history of abnormal Pap smears previously. 3. Mammography 01/2017. Continue with annual mammography when due. Breast exam normal today. 4. Colonoscopy 2015. Repeat at their recommended interval. 5. Health maintenance. No routine lab work done as patient does this elsewhere. Follow up 1 year, sooner as needed.   Anastasio Auerbach MD, 3:38 PM 02/13/2017

## 2017-02-13 NOTE — Patient Instructions (Signed)
Follow up in one year, sooner if menopausal symptoms worsen. Follow up if irregular bleeding occurs.

## 2017-02-13 NOTE — Addendum Note (Signed)
Addended by: Nelva Nay on: 02/13/2017 04:33 PM   Modules accepted: Orders

## 2017-02-14 LAB — PAP IG AND HPV HIGH-RISK: HPV DNA High Risk: NOT DETECTED

## 2017-02-18 ENCOUNTER — Other Ambulatory Visit: Payer: Self-pay | Admitting: Gynecology

## 2017-12-18 ENCOUNTER — Telehealth: Payer: Self-pay | Admitting: *Deleted

## 2017-12-18 MED ORDER — VALACYCLOVIR HCL 500 MG PO TABS: ORAL_TABLET | ORAL | 3 refills | Status: DC

## 2017-12-18 NOTE — Telephone Encounter (Signed)
Okay for Valtrex 500 mg #10 one p.o. twice daily x5 days with outbreak.  Refill x3

## 2017-12-18 NOTE — Telephone Encounter (Signed)
Patient called requesting refill on valtrex 500 mg tablet, last filled 2016, needs new Rx, rare outbreaks . Okay to refill?

## 2017-12-18 NOTE — Telephone Encounter (Signed)
Rx sent, pt aware 

## 2018-02-27 ENCOUNTER — Other Ambulatory Visit: Payer: Self-pay | Admitting: Gynecology

## 2018-02-28 ENCOUNTER — Other Ambulatory Visit: Payer: Self-pay | Admitting: Gynecology

## 2018-03-05 ENCOUNTER — Other Ambulatory Visit: Payer: Self-pay | Admitting: *Deleted

## 2018-03-05 MED ORDER — DULOXETINE HCL 30 MG PO CPEP: 30.0000 mg | ORAL_CAPSULE | Freq: Every day | ORAL | 0 refills | Status: DC

## 2018-03-05 NOTE — Telephone Encounter (Signed)
Patient has annual exam scheduled on 04/27/18, needs refill on cymbalta 30 mg cap.

## 2018-04-27 ENCOUNTER — Encounter: Payer: Self-pay | Admitting: Gynecology

## 2018-04-27 ENCOUNTER — Ambulatory Visit (INDEPENDENT_AMBULATORY_CARE_PROVIDER_SITE_OTHER): Payer: Commercial Managed Care - PPO | Admitting: Gynecology

## 2018-04-27 VITALS — BP 124/80 | Ht 62.5 in | Wt 264.0 lb

## 2018-04-27 DIAGNOSIS — Z01419 Encounter for gynecological examination (general) (routine) without abnormal findings: Secondary | ICD-10-CM

## 2018-04-27 NOTE — Patient Instructions (Signed)
Follow-up in 1 year for annual exam, sooner as needed. 

## 2018-04-27 NOTE — Progress Notes (Signed)
    Sheena [name] 13-Dec-1965 [id number]        52 y.o.  G1P1001 for annual gynecologic exam.  Without gynecologic complaints  Past medical history,surgical history, problem list, medications, allergies, family history and social history were all reviewed and documented as reviewed in the EPIC chart.  ROS:  Performed with pertinent positives and negatives included in the history, assessment and plan.   Additional significant findings : None   Exam: Caryn Bee assistant Vitals:   04/27/18 1410  BP: 124/80  Weight: 264 lb (119.7 kg)  Height: 5' 2.5" (1.588 m)   Body mass index is 47.52 kg/m.  General appearance:  Normal affect, orientation and appearance. Skin: Grossly normal HEENT: Without gross lesions.  No cervical or supraclavicular adenopathy. Thyroid normal.  Lungs:  Clear without wheezing, rales or rhonchi Cardiac: RR, without RMG Abdominal:  Soft, nontender, without masses, guarding, rebound, organomegaly or hernia Breasts:  Examined lying and sitting without masses, retractions, discharge or axillary adenopathy. Pelvic:  Ext, BUS, Vagina: Normal  Cervix: Normal  Uterus: Difficult to palpate but no gross masses or tenderness  Adnexa: Without gross masses or tenderness    Anus and perineum: Normal   Rectovaginal: Normal sphincter tone without palpated masses or tenderness.    Assessment/Plan:  52 y.o. G71P1001 female for annual gynecologic exam without menses.   1. Postmenopausal.  Patient's last menstrual period was August 2018.  Has remained without bleeding since.  Without significant menopausal symptoms.  We discussed the perimenopause and what to expect.  She will follow-up if she has significant symptoms or any vaginal bleeding. 2. Mammography 02/2018.  Continue with annual mammography next year.  Breast exam normal today. 3. Pap smear/HPV 11/2016.  No Pap smear done today.  No history of significant abnormal Pap smears.  Plan repeat Pap smear/HPV at 5-year interval  per current screening guidelines. 4. Colonoscopy 2015.  Repeat at their recommended interval. 5. Bone density never.  Will plan further into the menopause. 6. Health maintenance.  No routine lab work done as patient does this elsewhere.  Follow-up 1 year, sooner as needed.   Anastasio Auerbach MD, 2:36 PM 04/27/2018

## 2018-06-04 ENCOUNTER — Other Ambulatory Visit: Payer: Self-pay | Admitting: Gynecology

## 2018-06-05 NOTE — Telephone Encounter (Signed)
Had annual exam on 04/27/18

## 2019-03-13 ENCOUNTER — Encounter: Payer: Self-pay | Admitting: Gynecology

## 2019-06-11 ENCOUNTER — Other Ambulatory Visit: Payer: Self-pay | Admitting: Gynecology

## 2019-07-15 ENCOUNTER — Other Ambulatory Visit: Payer: Self-pay

## 2019-07-15 MED ORDER — DULOXETINE HCL 30 MG PO CPEP: 30.0000 mg | ORAL_CAPSULE | Freq: Every day | ORAL | 0 refills | Status: DC

## 2019-07-15 NOTE — Telephone Encounter (Signed)
CE scheduled 08/12/19 with Dr. Delilah Shan.

## 2019-07-29 ENCOUNTER — Telehealth: Payer: Self-pay | Admitting: *Deleted

## 2019-07-29 MED ORDER — VALACYCLOVIR HCL 500 MG PO TABS: ORAL_TABLET | ORAL | 1 refills | Status: DC

## 2019-07-29 NOTE — Telephone Encounter (Signed)
Patient called requesting refill on valtrex 500 mg tablet, last filled in 2019, rare outbreaks. Annual exam scheduled on 08/12/19, Rx sent.

## 2019-08-09 ENCOUNTER — Other Ambulatory Visit: Payer: Self-pay

## 2019-08-12 ENCOUNTER — Other Ambulatory Visit: Payer: Self-pay

## 2019-08-12 ENCOUNTER — Encounter: Payer: Self-pay | Admitting: Obstetrics and Gynecology

## 2019-08-12 ENCOUNTER — Ambulatory Visit (INDEPENDENT_AMBULATORY_CARE_PROVIDER_SITE_OTHER): Payer: Commercial Managed Care - PPO | Admitting: Obstetrics and Gynecology

## 2019-08-12 VITALS — BP 126/84 | Ht 62.0 in | Wt 256.0 lb

## 2019-08-12 DIAGNOSIS — N898 Other specified noninflammatory disorders of vagina: Secondary | ICD-10-CM | POA: Diagnosis not present

## 2019-08-12 DIAGNOSIS — Z01419 Encounter for gynecological examination (general) (routine) without abnormal findings: Secondary | ICD-10-CM | POA: Diagnosis not present

## 2019-08-12 NOTE — Progress Notes (Signed)
Sheena [name] Nov 09, 1965 [id number]  SUBJECTIVE:  54 y.o. G47P1001 female for annual routine gynecologic exam. She has no gynecologic concerns. Slight itching in vaginal area. No period for 1+ year. Sadly lost her husband due to severe sleep apnea just 2 weeks ago.  Current Outpatient Medications  Medication Sig Dispense Refill  . ALPRAZolam (XANAX) 0.5 MG tablet     . DULoxetine (CYMBALTA) 30 MG capsule Take 1 capsule (30 mg total) by mouth daily. 30 capsule 0  . FENOFIBRATE PO Take by mouth.    . Levothyroxine Sodium (SYNTHROID PO) Take 175 mcg by mouth.     . lisdexamfetamine (VYVANSE) 30 MG capsule Take 30 mg by mouth daily.    . Omeprazole (PRILOSEC PO) Take by mouth.    . TOPIRAMATE PO Take by mouth.    . traZODone (DESYREL) 50 MG tablet     . valACYclovir (VALTREX) 500 MG tablet Take one tablet twice daily for 5 days 10 tablet 1  . Vitamin D, Ergocalciferol, (DRISDOL) 1.25 MG (50000 UNIT) CAPS capsule Take 50,000 Units by mouth every 7 (seven) days.     No current facility-administered medications for this visit.   Allergies: Sulfa antibiotics  Patient's last menstrual period was 01/29/2017.  Past medical history,surgical history, problem list, medications, allergies, family history and social history were all reviewed and documented as reviewed in the EPIC chart.  ROS:  Feeling well. No dyspnea or chest pain on exertion.  No abdominal pain, change in bowel habits, black or bloody stools.  No urinary tract symptoms. GYN ROS: no abnormal bleeding, pelvic pain or discharge, no breast pain or new or enlarging lumps on self exam. No neurological complaints.   OBJECTIVE:  BP 126/84   Ht 5\' 2"  (1.575 m)   Wt 256 lb (116.1 kg)   LMP 01/29/2017   BMI 46.82 kg/m  The patient appears well, alert, oriented x 3, in no distress. ENT normal.  Neck supple. No cervical or supraclavicular adenopathy or thyromegaly.  Lungs are clear, good air entry, no wheezes, rhonchi or rales. S1 and S2  normal, no murmurs, regular rate and rhythm.  Abdomen soft without tenderness, guarding, mass or organomegaly.  Neurological is normal, no focal findings.  BREAST EXAM: breasts appear normal, no suspicious masses, no skin or nipple changes or axillary nodes  PELVIC EXAM: VULVA: normal appearing vulva with no masses, tenderness or lesions, VAGINA: normal appearing vagina with normal color and no discharge, no lesions, CERVIX: normal appearing cervix without discharge or lesions, UTERUS: uterus is normal size, shape, consistency and nontender, ADNEXA: Exam limited due to body habitus, nontender  Chaperone: Caryn Bee present during the examination  ASSESSMENT:  54 y.o. G1P1001 here for annual gynecologic exam  PLAN:   1. Postmenopausal.  No significant menopausal symptoms.  No findings on exam today to explain the etiology of the vaginal itching.  If she is experiencing this beyond the next few days, I would recommend avoiding soaps on the bottom or vigorous scrubbing.  She may also want to consider using empiric over-the-counter Monistat cream therapy.  If still no improvement she should let us know and we can reevaluate. 2. Pap smear/HPV 11/2016.  No significant history of abnormal Pap smears.  Next Pap smear due 2023 following the current guidelines recommending the 5 year interval. 3. Mammogram 02/2018.  Normal breast exam today.  She is reminded to schedule an annual mammogram this year. 4. Colonoscopy 2015.  Recommended that she follow up at the recommended  interval.   5. DEXA when further into menopause. 6. Health maintenance.  No labs today as she normally has these completed with her primary care provider.   Return annually or sooner, prn.  Joseph Pierini MD  08/12/19

## 2019-08-12 NOTE — Addendum Note (Signed)
Addended by: Nelva Nay on: 08/12/2019 04:28 PM   Modules accepted: Orders

## 2019-08-12 NOTE — Patient Instructions (Signed)
Please remember to schedule your annual mammogram this year. If the vaginal itching is not improving, consider avoiding soap on the bottom area or vigorous scrubbing, avoid use washcloths.  Try to cleanse with water only.  If still having symptoms consider using Monistat vaginal cream for 7 nights.  If still no improvement, let us know and we will take another look at the area and do some additional testing.

## 2019-08-19 ENCOUNTER — Telehealth: Payer: Self-pay | Admitting: *Deleted

## 2019-08-19 ENCOUNTER — Other Ambulatory Visit: Payer: Self-pay

## 2019-08-19 NOTE — Telephone Encounter (Signed)
Dr.Kendall patient called back and scheduled OV tomorrow regarding bleeding. She asked if Cymbalta 30 mg caps could be refilled.

## 2019-08-19 NOTE — Telephone Encounter (Signed)
Patient called reports vaginal bleeding started yesterday, reports no cycle in over 2 years. Patient said bleeding is not heavy,but a normal flow. Patient has been under a lot of stress due to husband passing on Feb 12th suddenly, takes Cymbalta 30 mg capsule,but has been off medication x 1 week. Patient needs refill, overdue for annual exam, I will have her schedule this for the future. Should patient schedule just office visit only to address bleeding now? Or schedule ultrasound with OV afterwards with you? Please advise

## 2019-08-20 ENCOUNTER — Other Ambulatory Visit: Payer: Self-pay

## 2019-08-20 ENCOUNTER — Ambulatory Visit: Payer: Commercial Managed Care - PPO | Admitting: Obstetrics and Gynecology

## 2019-08-20 MED ORDER — DULOXETINE HCL 30 MG PO CPEP: 30.0000 mg | ORAL_CAPSULE | Freq: Every day | ORAL | 0 refills | Status: AC

## 2019-08-20 NOTE — Telephone Encounter (Signed)
Yes, that is fine. Do we have ultrasound tomorrow as that would be best to get her in for that too?

## 2019-08-20 NOTE — Telephone Encounter (Signed)
Had annual exam with you 08/12/19.

## 2019-08-20 NOTE — Telephone Encounter (Signed)
It appears patient had annual exam on 08/12/19. Rx has been sent to pharmacy in the refill request. Patient was scheduled for today for OV, however she canceled this appointment .

## 2019-08-29 ENCOUNTER — Ambulatory Visit (INDEPENDENT_AMBULATORY_CARE_PROVIDER_SITE_OTHER): Payer: Commercial Managed Care - PPO | Admitting: Family Medicine

## 2019-08-29 ENCOUNTER — Encounter (INDEPENDENT_AMBULATORY_CARE_PROVIDER_SITE_OTHER): Payer: Self-pay | Admitting: Family Medicine

## 2019-08-29 ENCOUNTER — Other Ambulatory Visit: Payer: Self-pay

## 2019-08-29 VITALS — BP 132/88 | HR 99 | Temp 97.9°F | Ht 62.0 in | Wt 250.0 lb

## 2019-08-29 DIAGNOSIS — R0602 Shortness of breath: Secondary | ICD-10-CM

## 2019-08-29 DIAGNOSIS — F418 Other specified anxiety disorders: Secondary | ICD-10-CM | POA: Diagnosis not present

## 2019-08-29 DIAGNOSIS — R5383 Other fatigue: Secondary | ICD-10-CM | POA: Diagnosis not present

## 2019-08-29 DIAGNOSIS — M1712 Unilateral primary osteoarthritis, left knee: Secondary | ICD-10-CM

## 2019-08-29 DIAGNOSIS — E7849 Other hyperlipidemia: Secondary | ICD-10-CM

## 2019-08-29 DIAGNOSIS — E559 Vitamin D deficiency, unspecified: Secondary | ICD-10-CM

## 2019-08-29 DIAGNOSIS — Z9189 Other specified personal risk factors, not elsewhere classified: Secondary | ICD-10-CM | POA: Diagnosis not present

## 2019-08-29 DIAGNOSIS — Z0289 Encounter for other administrative examinations: Secondary | ICD-10-CM

## 2019-08-29 DIAGNOSIS — Z6841 Body Mass Index (BMI) 40.0 and over, adult: Secondary | ICD-10-CM

## 2019-08-29 NOTE — Progress Notes (Signed)
Chief Complaint:   OBESITY Sheena [name] (MR# [id number]) is a 54 y.o. female who presents for evaluation and treatment of obesity and related comorbidities. Current BMI is Body mass index is 45.73 kg/m. Sheena [name] has been struggling with her weight for many years and has been unsuccessful in either losing weight, maintaining weight loss, or reaching her healthy weight goal.  Sheena [name] is currently in the action stage of change and ready to dedicate time achieving and maintaining a healthier weight. Sheena [name] is interested in becoming our patient and working on intensive lifestyle modifications including (but not limited to) diet and exercise for weight loss.  Sheena [name]'s habits were reviewed today and are as follows: Her family eats meals together, her desired weight loss is 75 lbs, she has been heavy most of her life, she started gaining weight when starting dating her husband and during pregnancy, her heaviest weight ever was 280 pounds, she has significant food cravings issues, she snacks frequently in the evenings, she skips meals frequently, she is frequently drinking liquids with calories, she frequently makes poor food choices and she struggles with emotional eating.  Depression Screen Sheena [name]'s Food and Mood (modified PHQ-9) score was 10.  Depression screen Sheena [name] 2/9 08/29/2019  Decreased Interest 3  Down, Depressed, Hopeless 1  PHQ - 2 Score 4  Altered sleeping 0  Tired, decreased energy 3  Change in appetite 1  Feeling bad or failure about yourself  0  Trouble concentrating 2  Moving slowly or fidgety/restless 0  Suicidal thoughts 0  PHQ-9 Score 10  Difficult doing work/chores Not difficult at all   Subjective:   1. Other fatigue Sheena [name] admits to daytime somnolence and admits to waking up still tired. Patent has a history of symptoms of daytime fatigue and morning headache. Sheena [name] generally gets 6 hours of sleep per night, and states that she has generally restful sleep. Snoring is present. Apneic  episodes are not present. Epworth Sleepiness Score is 11.  2. SOB (shortness of breath) on exertion Sheena [name] notes increasing shortness of breath with exercising and seems to be worsening over time with weight gain. She notes getting out of breath sooner with activity than she used to. This has not gotten worse recently. Sheena [name] denies shortness of breath at rest or orthopnea.  3. Other hyperlipidemia Sheena [name] is on fenofibrate, and she has no recent labs in Epic.  4. Vitamin D deficiency Sheena [name] is on Vit D prescription, and she has no recent labs in Epic. She notes fatigue.  5. Osteoarthritis of left knee, unspecified osteoarthritis type Sheena [name] is followed by Ortho, and she wants to have a knee replacement and she needs to lose enough weight to get BMI down.   6. Depression with anxiety Sheena [name] is on multiple medications. She is also going through acute grieving with the loss of her husband last month, and the death of her father previously. Her affect is normal today, and she shows no signs of suicidal ideas or homicidal ideas.  7. At risk for deficient intake of food The patient is at a higher than average risk of deficient intake of food due to decreased appetite and depression.  Assessment/Plan:   1. Other fatigue Sheena [name] does feel that her weight is causing her energy to be lower than it should be. Fatigue may be related to obesity, depression or many other causes. Labs will be ordered, and in the meanwhile, Sheena [name] will focus on self care including making healthy food choices, increasing physical activity  and focusing on stress reduction.  - EKG 12-Lead - Hemoglobin A1c - Insulin, random - Vitamin B12 - Folate - T3 - T4, free - TSH  2. SOB (shortness of breath) on exertion Sheena [name] does feel that she gets out of breath more easily that she used to when she exercises. Sheena [name]'s shortness of breath appears to be obesity related and exercise induced. She has agreed to work on weight loss and gradually  increase exercise to treat her exercise induced shortness of breath. Will continue to monitor closely.  - Hemoglobin A1c - Insulin, random - Vitamin B12 - Folate - T3 - T4, free - TSH  3. Other hyperlipidemia Cardiovascular risk and specific lipid/LDL goals reviewed. We discussed several lifestyle modifications today. Sheena [name] will start her diet, and will continue to work on exercise and weight loss efforts. We will check labs today. Orders and follow up as documented in patient record.   - Comprehensive metabolic panel - CBC with Differential/Platelet - Hemoglobin A1c - Insulin, random - Lipid Panel With LDL/HDL Ratio  4. Vitamin D deficiency Low Vitamin D level contributes to fatigue and are associated with obesity, breast, and colon cancer. We will check labs today. Sheena [name] will follow-up for routine testing of Vitamin D, at least 2-3 times per year to avoid over-replacement.  - VITAMIN D 25 Hydroxy (Vit-D Deficiency, Fractures)  5. Osteoarthritis of left knee, unspecified osteoarthritis type Sheena [name] will start with diet and weight loss, and will follow closely.  6. Depression with anxiety Behavior modification techniques were discussed today to help Sheena [name] deal with her emotional/non-hunger eating behaviors. We will refer to Dr. Mallie [name], our Bariatric Psychologist. Orders and follow up as documented in patient record.   7. At risk for deficient intake of food Sheena [name] was given approximately 30 minutes of deficit intake of food prevention counseling today. Sheena [name] is at risk for eating too few calories based on current food recall. She was encouraged to focus on meeting caloric and protein goals according to her recommended meal plan.   8. Class 3 severe obesity with serious comorbidity and body mass index (BMI) of 45.0 to 49.9 in adult, unspecified obesity type (HCC) Sheena [name] is currently in the action stage of change and her goal is to continue with weight loss efforts. I recommend Sheena [name] begin the  structured treatment plan as follows:  She has agreed to the Category 2 Plan.  Exercise goals: No exercise has been prescribed for now, while we concentrate on nutritional changes.   Behavioral modification strategies: increasing lean protein intake and no skipping meals.  She was informed of the importance of frequent follow-up visits to maximize her success with intensive lifestyle modifications for her multiple health conditions. She was informed we would discuss her lab results at her next visit unless there is a critical issue that needs to be addressed sooner. Ivory agreed to keep her next visit at the agreed upon time to discuss these results.  Objective:   Blood pressure 132/88, pulse 99, temperature 97.9 F (36.6 C), temperature source Oral, height 5\' 2"  (1.575 m), weight 250 lb (113.4 kg), last menstrual period 01/29/2017, SpO2 99 %. Body mass index is 45.73 kg/m.  EKG: Normal sinus rhythm, rate 97 BPM.  Indirect Calorimeter completed today shows a VO2 of 326 and a REE of 2267.  Her calculated basal metabolic rate is [id number] thus her basal metabolic rate is better than expected.  General: Cooperative, alert, well developed, in no acute distress. HEENT: Conjunctivae and lids unremarkable. Cardiovascular:  Regular rhythm.  Lungs: Normal work of breathing. Neurologic: No focal deficits.   No results found for: CREATININE, BUN, NA, K, CL, CO2 No results found for: ALT, AST, GGT, ALKPHOS, BILITOT No results found for: HGBA1C No results found for: INSULIN Lab Results  Component Value Date   TSH 1.022 07/31/2012   No results found for: CHOL, HDL, LDLCALC, LDLDIRECT, TRIG, CHOLHDL Lab Results  Component Value Date   WBC 10.3 12/18/2015   HGB 12.2 12/18/2015   HCT 38.4 12/18/2015   MCV 77.3 (L) 12/18/2015   PLT 393 12/18/2015   No results found for: IRON, TIBC, FERRITIN  Attestation Statements:   Reviewed by clinician on day of visit: allergies, medications, problem list,  medical history, surgical history, family history, social history, and previous encounter notes.   I, Trixie Dredge, am acting as transcriptionist for Dennard Nip, MD.  I have reviewed the above documentation for accuracy and completeness, and I agree with the above. - Dennard Nip, MD

## 2019-08-29 NOTE — Progress Notes (Signed)
Office: [phone]  /  Fax: [phone]    Date: September 04, 2019   Appointment Start Time: 9:04am Duration: 47 minutes Provider: Glennie Isle, Psy.D. Type of Session: Intake for Individual Therapy  Location of Patient: Work Location of Provider: Provider's Home Type of Contact: Telepsychological Visit via [employer]  Informed Consent: Prior to proceeding with today's appointment, two pieces of identifying information were obtained. In addition, Abigal's physical location at the time of this appointment was obtained as well a phone number she could be reached at in the event of technical difficulties. Riyan and this provider participated in today's telepsychological service.   The provider's role was explained to [facility]. The provider reviewed and discussed issues of confidentiality, privacy, and limits therein (e.g., reporting obligations). In addition to verbal informed consent, written informed consent for psychological services was obtained prior to the initial appointment. Since the clinic is not a 24/7 crisis center, mental health emergency resources were shared and this  provider explained MyChart, e-mail, voicemail, and/or other messaging systems should be utilized only for non-emergency reasons. This provider also explained that information obtained during appointments will be placed in Talishia's medical record and relevant information will be shared with other providers at [facility] for coordination of care. Moreover, Latash agreed information may be shared with other [facility] providers as needed for coordination of care. By signing the service agreement document, Jacquelin provided written consent for coordination of care. Prior to initiating telepsychological services, Delanee completed an informed consent document, which included the development of a safety plan (i.e., an emergency contact, nearest emergency room, and emergency resources) in the event of an  emergency/crisis. Ziyanna expressed understanding of the rationale of the safety plan. Jordon verbally acknowledged understanding she is ultimately responsible for understanding her insurance benefits for telepsychological and in-person services. This provider also reviewed confidentiality, as it relates to telepsychological services, as well as the rationale for telepsychological services (i.e., to reduce exposure risk to COVID-19). Mckinley  acknowledged understanding that appointments cannot be recorded without both party consent and she is aware she is responsible for securing confidentiality on her end of the session. Sylvania verbally consented to proceed.  Chief Complaint/HPI: Jalie was referred by Dr. Dennard Nip due to depression with anxiety. Per the note for the initial visit with Dr. Dennard Nip on August 29, 2019, "Emalie is on multiple medications. She is also going through acute grieving with the loss of her husband last month, and the death of her father previously. Her affect is normal today, and she shows no signs of suicidal ideas or homicidal ideas." The note for the initial appointment with Dr. Dennard Nip indicated the following: "Cecilee's habits were reviewed today and are as follows: Her family eats meals together, her desired weight loss is 75 lbs, she has been heavy most of her life, she started gaining weight when starting dating her husband and during pregnancy, her heaviest weight ever was 280 pounds, she has significant food cravings issues, she snacks frequently in the evenings, she skips meals frequently, she is frequently drinking liquids with calories, she frequently makes poor food choices and she struggles with emotional eating." Kashlyn's Food and Mood (modified PHQ-9) score on August 29, 2019 was 10.  During today's appointment, Pearlean was verbally administered a questionnaire assessing various behaviors related to emotional eating. Sariya endorsed the following: overeat when you are  celebrating, eat certain foods when you are anxious, stressed, depressed, or your feelings are hurt,  find food is comforting to you, not worry about what you eat when you are in a good mood, eat to help you stay awake and eat as a reward. She shared she craves savory foods (e.g., cream, subs, pizza, mayonnaise). Caterra believes the onset of emotional eating was likely five years ago, and described the current frequency of emotional eating as "daily." In addition, Calista denied a history of binge eating. Hodaya denied a history of restricting food intake, purging and engagement in other compensatory strategies, and has never been diagnosed with an eating disorder. She also denied a history of treatment for emotional eating. Moreover, Muriah indicated eating is "a way of life," noting she did not realize she was engaging in emotional eating until she met with Dr. Leafy Ro. She shared celebrations and feeling happy triggers emotional eating. She noted she often experiences decreased appetite when experiencing unpleasant emotions. Furthermore, Orrie denied other problems of concern. However, she shared her father passed away in Sep 13, 2013 after getting "really sick," adding, "that was very devastating." She noted the aforementioned contributed to "not caring" about what she ate. In 14-Sep-2014, Skylarr stated her mother suffered an infection resulting in multiple surgeries and her moving to a nursing home in 09-13-17. On August 02, 2019, Dynasty indicated she found her husband passed away in his sleep.   Mental Status Examination:  Appearance: well groomed and appropriate hygiene  Behavior: appropriate to circumstances Mood: sad Affect: mood congruent; tearful at times Speech: normal in rate, volume, and tone Eye Contact: appropriate Psychomotor Activity: appropriate Gait: unable to assess Thought Process: linear, logical, and goal directed  Thought Content/Perception: denies suicidal and homicidal ideation, plan, and intent and no  hallucinations, delusions, bizarre thinking or behavior reported or observed Orientation: time, person, place and purpose of appointment Memory/Concentration: memory, attention, language, and fund of knowledge intact  Insight/Judgment: good  Family & Psychosocial History: Yajaira shared she is an only child, noting she had a "small family." She reported she is widowed and she has one son (age 4). 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 Additionally, Daenerys shared her highest level of education obtained is an associate's degree. Currently, Emil's social support system consists of her son, mother and friends at work. Moreover, Roselin stated she resides alone, noting her son lives nearby.   Medical History:  Past Medical History:  Diagnosis Date  . Anemia   . Anxiety   . Back pain   . Depression   . GERD (gastroesophageal reflux disease)   . High cholesterol   . IC (interstitial cystitis)   . Interstitial cystitis   . Knee pain, bilateral   . Obesity   . Osteoarthritis   . STD (sexually transmitted disease)    HSV ll  . Thyroid disease    Hypothyroid  . Vitamin D deficiency    Past Surgical History:  Procedure Laterality Date  . CESAREAN SECTION    . FOOT SURGERY    . WISDOM TOOTH EXTRACTION  September 14, 1998   Current Outpatient Medications on File Prior to Visit  Medication Sig Dispense Refill  . ALPRAZolam (XANAX) 0.5 MG tablet     . DULoxetine (CYMBALTA) 30 MG capsule Take 1 capsule (30 mg total) by mouth daily. 30 capsule 0  . FENOFIBRATE PO Take 160 mg by mouth daily.     Marland Kitchen levothyroxine (EUTHYROX) 175 MCG tablet Euthyrox 175 mcg tablet    . lisdexamfetamine (VYVANSE) 30 MG capsule Take 30 mg by mouth  daily.    . Omeprazole (PRILOSEC PO) Take 20 mg by mouth daily.     . TOPIRAMATE PO Take 50 mg by mouth daily.     . traZODone (DESYREL) 50 MG tablet     . valACYclovir (VALTREX) 500 MG tablet Take one tablet twice daily for 5 days 10 tablet  1  . Vitamin D, Ergocalciferol, (DRISDOL) 1.25 MG (50000 UNIT) CAPS capsule Take 50,000 Units by mouth every 7 (seven) days.     No current facility-administered medications on file prior to visit.  Mayrani denied a history of head injuries and loss of consciousness.   Mental Health History: Clydie reported she attended grief counseling for approximately one year after her father passed away. Kamile reported there is no history of hospitalizations for psychiatric concerns, and she has never met with a psychiatrist. Raiven stated her PCP currently prescribes Cymbalta and Desyrel PRN, noting they are helpful. Mari denied a family history of mental health related concerns. Koralyn reported there is no history of trauma including psychological, physical  and sexual abuse, as well as neglect.   Scharlene described her typical mood lately as "sad, anxious, scared." She explained she was with her husband for 32 years, adding, "I've never been on my own." Aside from concerns noted above and endorsed on the PHQ-9 and GAD-7, Tais reported experiencing crying spells; separation anxiety from her son after her husband passed away; and what if scenarios. Keylen denied current alcohol use. She endorsed tobacco use, noting she smokes "a pack a day or less" of cigarettes. She denied illicit/recreational substance use. She denied current caffeine intake. Furthermore, Laekyn indicated she is not experiencing the following: hallucinations and delusions, paranoia, symptoms of mania , social withdrawal and panic attacks. She also denied history of and current suicidal ideation, plan, and intent; history of and current homicidal ideation, plan, and intent; and history of and current engagement in self-harm.  The following strengths were reported by [name]: strong faith in God, resilient, and caring. The following strengths were observed by this provider: ability to express thoughts and feelings during the therapeutic session, ability to establish and  benefit from a therapeutic relationship, willingness to work toward established goal(s) with the clinic and ability to engage in reciprocal conversation.  Legal History: Lorilei reported there is no history of legal involvement.   Structured Assessments Results: The Patient Health Questionnaire-9 (PHQ-9) is a self-report measure that assesses symptoms and severity of depression over the course of the last two weeks. Naveh obtained a score of 5 suggesting mild depression. Mikeyla finds the endorsed symptoms to be somewhat difficult. [0= Not at all; 1= Several days; 2= More than half the days; 3= Nearly every day] Little interest or pleasure in doing things 0  Feeling down, depressed, or hopeless 0  Trouble falling or staying asleep, or sleeping too much 0  Feeling tired or having little energy 0  Poor appetite or overeating 3  Feeling bad about yourself --- or that you are a failure or have let yourself or your family down 0  Trouble concentrating on things, such as reading the newspaper or watching television 2  Moving or speaking so slowly that other people could have noticed? Or the opposite --- being so fidgety or restless that you have been moving around a lot more than usual 0  Thoughts that you would be better off dead or hurting yourself in some way 0  PHQ-9 Score 5    The Generalized Anxiety Disorder-7 (GAD-7) is  a brief self-report measure that assesses symptoms of anxiety over the course of the last two weeks. Bridgit obtained a score of 8 suggesting mild anxiety. Aviendha finds the endorsed symptoms to be not difficult at all. [0= Not at all; 1= Several days; 2= Over half the days; 3= Nearly every day] Feeling nervous, anxious, on edge 3  Not being able to stop or control worrying 3  Worrying too much about different things 0  Trouble relaxing 0  Being so restless that it's hard to sit still 0  Becoming easily annoyed or irritable 1  Feeling afraid as if something awful might happen 1  GAD-7  Score 8   Interventions:  Conducted a chart review Focused on rapport building Verbally administered PHQ-9 and GAD-7 for symptom monitoring Verbally administered Food & Mood questionnaire to assess various behaviors related to emotional eating. Provided emphatic reflections and validation Collaborated with patient on a treatment goal  Psychoeducation provided regarding physical versus emotional hunger  Briefly discussed the option of grief counseling  Provisional DSM-5 Diagnosis(es): 311 (F32.8) Other Specified Depressive Disorder, Emotional Eating Behaviors and 300.00 (F41.9) Unspecified Anxiety Disorder  Plan: Chalee appears able and willing to participate as evidenced by collaboration on a treatment goal, engagement in reciprocal conversation, and asking questions as needed for clarification. The next appointment will be scheduled in two weeks, which will be via [employer]. The following treatment goal was established: increase coping skills. This provider will regularly review the treatment plan and medical chart to keep informed of status changes. Myrella expressed understanding and agreement with the initial treatment plan of care.   This provider discussed options to establish care with a new provider for grief counseling, including Carnelia contacting her insurance company for a list of [employer], exploring [email or web address], this provider sending a list of referral options, or this provider placing a referral. Irena provided verbal consent for this provider to send her a list of referral options. Additionally, Marti will be sent a handout via e-mail to utilize between now and the next appointment to increase awareness of hunger patterns and subsequent eating. Anetha provided verbal consent during today's appointment for this provider to send the handout via e-mail.

## 2019-08-30 LAB — COMPREHENSIVE METABOLIC PANEL
ALT: 25 IU/L (ref 0–32)
AST: 25 IU/L (ref 0–40)
Albumin/Globulin Ratio: 1.3 (ref 1.2–2.2)
Albumin: 3.9 g/dL (ref 3.8–4.9)
Alkaline Phosphatase: 107 IU/L (ref 39–117)
BUN/Creatinine Ratio: 14 (ref 9–23)
BUN: 14 mg/dL (ref 6–24)
Bilirubin Total: 0.4 mg/dL (ref 0.0–1.2)
CO2: 22 mmol/L (ref 20–29)
Calcium: 9.4 mg/dL (ref 8.7–10.2)
Chloride: 104 mmol/L (ref 96–106)
Creatinine, Ser: 1 mg/dL (ref 0.57–1.00)
GFR calc Af Amer: 75 mL/min/{1.73_m2} (ref >59)
GFR calc non Af Amer: 65 mL/min/{1.73_m2} (ref >59)
Globulin, Total: 2.9 g/dL (ref 1.5–4.5)
Glucose: 90 mg/dL (ref 65–99)
Potassium: 4.5 mmol/L (ref 3.5–5.2)
Sodium: 139 mmol/L (ref 134–144)
Total Protein: 6.8 g/dL (ref 6.0–8.5)

## 2019-08-30 LAB — CBC WITH DIFFERENTIAL/PLATELET
Basophils Absolute: 0.1 10*3/uL (ref 0.0–0.2)
Basos: 1 %
EOS (ABSOLUTE): 0.2 10*3/uL (ref 0.0–0.4)
Eos: 2 %
Hematocrit: 43.6 % (ref 34.0–46.6)
Hemoglobin: 13.7 g/dL (ref 11.1–15.9)
Immature Grans (Abs): 0 10*3/uL (ref 0.0–0.1)
Immature Granulocytes: 0 %
Lymphocytes Absolute: 3.5 10*3/uL — ABNORMAL HIGH (ref 0.7–3.1)
Lymphs: 31 %
MCH: 25 pg — ABNORMAL LOW (ref 26.6–33.0)
MCHC: 31.4 g/dL — ABNORMAL LOW (ref 31.5–35.7)
MCV: 80 fL (ref 79–97)
Monocytes Absolute: 0.6 10*3/uL (ref 0.1–0.9)
Monocytes: 6 %
Neutrophils Absolute: 7 10*3/uL (ref 1.4–7.0)
Neutrophils: 60 %
Platelets: 342 10*3/uL (ref 150–450)
RBC: 5.48 x10E6/uL — ABNORMAL HIGH (ref 3.77–5.28)
RDW: 14.5 % (ref 11.7–15.4)
WBC: 11.4 10*3/uL — ABNORMAL HIGH (ref 3.4–10.8)

## 2019-08-30 LAB — LIPID PANEL WITH LDL/HDL RATIO
Cholesterol, Total: 186 mg/dL (ref 100–199)
HDL: 42 mg/dL (ref >39)
LDL Chol Calc (NIH): 114 mg/dL — ABNORMAL HIGH (ref 0–99)
LDL/HDL Ratio: 2.7 ratio (ref 0.0–3.2)
Triglycerides: 170 mg/dL — ABNORMAL HIGH (ref 0–149)
VLDL Cholesterol Cal: 30 mg/dL (ref 5–40)

## 2019-08-30 LAB — VITAMIN D 25 HYDROXY (VIT D DEFICIENCY, FRACTURES): Vit D, 25-Hydroxy: 42.2 ng/mL (ref 30.0–100.0)

## 2019-08-30 LAB — HEMOGLOBIN A1C
Est. average glucose Bld gHb Est-mCnc: 123 mg/dL
Hgb A1c MFr Bld: 5.9 % — ABNORMAL HIGH (ref 4.8–5.6)

## 2019-08-30 LAB — TSH: TSH: 0.427 u[IU]/mL — ABNORMAL LOW (ref 0.450–4.500)

## 2019-08-30 LAB — INSULIN, RANDOM: INSULIN: 15 u[IU]/mL (ref 2.6–24.9)

## 2019-08-30 LAB — VITAMIN B12: Vitamin B-12: 258 pg/mL (ref 232–1245)

## 2019-08-30 LAB — T3: T3, Total: 114 ng/dL (ref 71–180)

## 2019-08-30 LAB — T4, FREE: Free T4: 1.41 ng/dL (ref 0.82–1.77)

## 2019-08-30 LAB — FOLATE: Folate: 2.7 ng/mL — ABNORMAL LOW (ref >3.0)

## 2019-09-04 ENCOUNTER — Other Ambulatory Visit: Payer: Self-pay

## 2019-09-04 ENCOUNTER — Ambulatory Visit (INDEPENDENT_AMBULATORY_CARE_PROVIDER_SITE_OTHER): Payer: Commercial Managed Care - PPO | Admitting: Psychology

## 2019-09-04 DIAGNOSIS — F419 Anxiety disorder, unspecified: Secondary | ICD-10-CM | POA: Diagnosis not present

## 2019-09-04 DIAGNOSIS — F3289 Other specified depressive episodes: Secondary | ICD-10-CM | POA: Diagnosis not present

## 2019-09-04 NOTE — Progress Notes (Signed)
  Office: [phone]  /  Fax: [phone]    Date: September 17, 2019   Appointment Start Time: 10:01am Duration: 31 minutes Provider: Glennie Isle, Psy.D. Type of Session: Individual Therapy  Location of Patient: Work Biomedical scientist of Provider: Provider's Home Type of Contact: Telepsychological Visit via [employer]  Session Content: Sheena [name] is a 54 y.o. female presenting via [place] for a follow-up appointment to address the previously established treatment goal of increasing coping skills. Today's appointment was a telepsychological visit due to COVID-19. Sheena [name] provided verbal consent for today's telepsychological appointment and she is aware she is responsible for securing confidentiality on her end of the session. Prior to proceeding with today's appointment, Sheena [name]'s physical location at the time of this appointment was obtained as well a phone number she could be reached at in the event of technical difficulties. Teleah and this provider participated in today's telepsychological service.   This provider conducted a brief check-in. Sheena [name] shared about recent events, including a day where she had a "pity party" and deviated from her meal plan for dinner. This was explored further and processed. Emotional and physical hunger was reviewed. Moreover, psychoeducation regarding triggers for emotional eating was provided. Sheena [name] was provided a handout, and encouraged to utilize the handout between now and the next appointment to increase awareness of triggers and frequency. Sheena [name] agreed. This provider also discussed behavioral strategies for specific triggers, such as placing the utensil down when conversing to avoid mindless eating. Sheena [name] provided verbal consent during today's appointment for this provider to send a handout about triggers via e-mail. Furthermore, this provider further discussed longer-term therapeutic services. She acknowledged she has not explored options due to lack of time, noting she will try to  explore options. Overall, Sheena [name] was receptive to today's appointment as evidenced by openness to sharing, responsiveness to feedback, and willingness to explore triggers for emotional eating.  Mental Status Examination:  Appearance: well groomed and appropriate hygiene  Behavior: appropriate to circumstances Mood: euthymic Affect: mood congruent Speech: normal in rate, volume, and tone Eye Contact: appropriate Psychomotor Activity: appropriate Gait: unable to assess Thought Process: linear, logical, and goal directed  Thought Content/Perception: no hallucinations, delusions, bizarre thinking or behavior reported or observed and no evidence of suicidal and homicidal ideation, plan, and intent Orientation: time, person, place and purpose of appointment Memory/Concentration: memory, attention, language, and fund of knowledge intact  Insight/Judgment: good  Interventions:  Conducted a brief chart review Provided empathic reflections and validation Reviewed content from the previous session Employed supportive psychotherapy interventions to facilitate reduced distress and to improve coping skills with identified stressors Employed motivational interviewing skills to assess patient's willingness/desire to adhere to recommended medical treatments and assignments Psychoeducation provided regarding triggers for emotional eating  DSM-5 Diagnosis(es): 311 (F32.8) Other Specified Depressive Disorder, Emotional Eating Behaviors and 300.00 (F41.9) Unspecified Anxiety Disorder  Treatment Goal & Progress: During the initial appointment with this provider, the following treatment goal was established: increase coping skills. Sheena [name] has demonstrated progress in her goal as evidenced by increased awareness of hunger patterns.   Plan: The next appointment will be scheduled in approximately two weeks, which will be via MyChart Video Visit. The next session will focus on working towards the established treatment  goal.

## 2019-09-12 ENCOUNTER — Ambulatory Visit (INDEPENDENT_AMBULATORY_CARE_PROVIDER_SITE_OTHER): Payer: Commercial Managed Care - PPO | Admitting: Family Medicine

## 2019-09-12 ENCOUNTER — Other Ambulatory Visit: Payer: Self-pay

## 2019-09-12 ENCOUNTER — Encounter (INDEPENDENT_AMBULATORY_CARE_PROVIDER_SITE_OTHER): Payer: Self-pay | Admitting: Family Medicine

## 2019-09-12 VITALS — BP 129/82 | HR 96 | Temp 98.3°F | Ht 62.0 in | Wt 244.0 lb

## 2019-09-12 DIAGNOSIS — E039 Hypothyroidism, unspecified: Secondary | ICD-10-CM

## 2019-09-12 DIAGNOSIS — E559 Vitamin D deficiency, unspecified: Secondary | ICD-10-CM | POA: Diagnosis not present

## 2019-09-12 DIAGNOSIS — E7849 Other hyperlipidemia: Secondary | ICD-10-CM | POA: Diagnosis not present

## 2019-09-12 DIAGNOSIS — E538 Deficiency of other specified B group vitamins: Secondary | ICD-10-CM | POA: Diagnosis not present

## 2019-09-12 DIAGNOSIS — E66813 Obesity, class 3: Secondary | ICD-10-CM

## 2019-09-12 DIAGNOSIS — Z9189 Other specified personal risk factors, not elsewhere classified: Secondary | ICD-10-CM

## 2019-09-12 DIAGNOSIS — Z6841 Body Mass Index (BMI) 40.0 and over, adult: Secondary | ICD-10-CM

## 2019-09-12 DIAGNOSIS — R7303 Prediabetes: Secondary | ICD-10-CM

## 2019-09-12 MED ORDER — METFORMIN HCL 500 MG PO TABS: 500.0000 mg | ORAL_TABLET | Freq: Every morning | ORAL | 0 refills | Status: DC

## 2019-09-16 NOTE — Progress Notes (Signed)
Chief Complaint:   OBESITY Sheena [name] is here to discuss her progress with her obesity treatment plan along with follow-up of her obesity related diagnoses. Sheena [name] is on the Category 2 Plan and states she is following her eating plan approximately 75% of the time. Sheena [name] states she is doing 0 minutes 0 times per week.  Today's visit was #: 2 Starting weight: 250 lbs Starting date: 08/29/2019 Today's weight: 244 lbs Today's date: 09/12/2019 Total lbs lost to date: 6 Total lbs lost since last in-office visit: 6  Interim History: Sheena [name] reports that her nerves and emotions have been all over the place. Her mother is in a nursing home, and her son isn't available for lunch and her husband recently passed away. She recently felt overwhelmed and deprived while at the grocery store shopping.  Subjective:   1. Other hyperlipidemia Sheena [name]'s lipid panel done on 08/29/2019 showed a total cholesterol of 186, HDL 42, triglycerides 170, and LDL 114. She is currently on fenofibrate 160 mg q daily. I discussed labs with the patient today.  2. Hypothyroidism, unspecified type Sheena [name]'s TSH done on 08/29/2019 was 0.427, T3 114, and free T4 1.41. She is currently on levothyroxine 175 mcg q daily. I discussed labs with the patient today.  3. Vitamin D deficiency Sheena [name]'s Vit D level was 42.2 on 08/29/2019. Her Vit D goal is >50. She is currently on prescription strength Vit D supplementation for >6 months. I discussed labs with the patient today.  4. Folate deficiency Sheena [name]'s folate was 27 on 08/29/2019. I discussed labs with the patient today.  5. Pre-diabetes Sheena [name]'s A1c done on 08/29/2019 was 5.9 and fasting insulin was 15.0. She is not currently on anti-diabetic medications. I recommended starting metformin. I discussed labs with the patient today.  6. At risk for diarrhea Sheena [name] is at higher risk of diarrhea due to starting metformin to treat pre-diabetes  Assessment/Plan:   1. Other hyperlipidemia Cardiovascular  risk and specific lipid/LDL goals reviewed. We discussed several lifestyle modifications today. Tattiana will continue her Category 2 meal plan, and will continue to work on exercise and weight loss efforts. We will recheck labs in 3 months. Orders and follow up as documented in patient record.   2. Hypothyroidism, unspecified type Patient with long-standing hypothyroidism, on levothyroxine therapy. She appears euthyroid. We will continue to monitor. Orders and follow up as documented in patient record.  3. Vitamin D deficiency Low Vitamin D level contributes to fatigue and are associated with obesity, breast, and colon cancer. Sheena [name] agreed to start OTC multivitamins to increase Vit D level. She will follow-up for routine testing of Vitamin D, at least 2-3 times per year to avoid over-replacement.  4. Folate deficiency Sheena [name] agreed to start OTC multivitamins to increase folate level.  5. Pre-diabetes Sheena [name] will continue her Category 2 meal plan, and will continue to work on weight loss, exercise, and decreasing simple carbohydrates to help decrease the risk of diabetes. Sheena [name] agreed to start metformin 500 mg daily with no refills. We will recheck labs in 3 months.  - metFORMIN (GLUCOPHAGE) 500 MG tablet; Take 1 tablet (500 mg total) by mouth every morning.  Dispense: 30 tablet; Refill: 0  6. At risk for diarrhea Sheena [name] was given approximately 30 minutes of diarrhea prevention counseling today. She is 54 y.o. female and has risk factors for diarrhea including medications and changes in diet. We discussed intensive lifestyle modifications today with an emphasis on specific weight loss instructions including dietary strategies.  Repetitive spaced learning was employed today to elicit superior memory formation and behavioral change.  7. Class 3 severe obesity with serious comorbidity and body mass index (BMI) of 40.0 to 44.9 in adult, unspecified obesity type (HCC) Sheena [name] is currently in the action stage of  change. As such, her goal is to continue with weight loss efforts. She has agreed to the Category 2 Plan.   Exercise goals: No exercise has been prescribed at this time.  Behavioral modification strategies: increasing lean protein intake and decreasing simple carbohydrates.  Sheena [name] has agreed to follow-up with our clinic in 2 to 3 weeks. She was informed of the importance of frequent follow-up visits to maximize her success with intensive lifestyle modifications for her multiple health conditions.   Objective:   Blood pressure 129/82, pulse 96, temperature 98.3 F (36.8 C), temperature source Oral, height 5\' 2"  (1.575 m), weight 244 lb (110.7 kg), last menstrual period 01/29/2017, SpO2 99 %. Body mass index is 44.63 kg/m.  General: Cooperative, alert, well developed, in no acute distress. HEENT: Conjunctivae and lids unremarkable. Cardiovascular: Regular rhythm.  Lungs: Normal work of breathing. Neurologic: No focal deficits.   Lab Results  Component Value Date   CREATININE 1.00 08/29/2019   BUN 14 08/29/2019   NA 139 08/29/2019   K 4.5 08/29/2019   CL 104 08/29/2019   CO2 22 08/29/2019   Lab Results  Component Value Date   ALT 25 08/29/2019   AST 25 08/29/2019   ALKPHOS 107 08/29/2019   BILITOT 0.4 08/29/2019   Lab Results  Component Value Date   HGBA1C 5.9 (H) 08/29/2019   Lab Results  Component Value Date   INSULIN 15.0 08/29/2019   Lab Results  Component Value Date   TSH 0.427 (L) 08/29/2019   Lab Results  Component Value Date   CHOL 186 08/29/2019   HDL 42 08/29/2019   LDLCALC 114 (H) 08/29/2019   TRIG 170 (H) 08/29/2019   Lab Results  Component Value Date   WBC 11.4 (H) 08/29/2019   HGB 13.7 08/29/2019   HCT 43.6 08/29/2019   MCV 80 08/29/2019   PLT 342 08/29/2019   No results found for: IRON, TIBC, FERRITIN  Attestation Statements:   Reviewed by clinician on day of visit: allergies, medications, problem list, medical history, surgical history,  family history, social history, and previous encounter notes.   I, Trixie Dredge, am acting as transcriptionist for Dennard Nip, MD.  I have reviewed the above documentation for accuracy and completeness, and I agree with the above. -  Dennard Nip, MD

## 2019-09-17 ENCOUNTER — Other Ambulatory Visit: Payer: Self-pay

## 2019-09-17 ENCOUNTER — Ambulatory Visit (INDEPENDENT_AMBULATORY_CARE_PROVIDER_SITE_OTHER): Payer: Commercial Managed Care - PPO | Admitting: Psychology

## 2019-09-17 DIAGNOSIS — F3289 Other specified depressive episodes: Secondary | ICD-10-CM

## 2019-09-17 DIAGNOSIS — F419 Anxiety disorder, unspecified: Secondary | ICD-10-CM | POA: Diagnosis not present

## 2019-09-18 NOTE — Progress Notes (Signed)
Office: [phone]  /  Fax: [phone]    Date: October 02, 2019   Appointment Start Time: 9:59am Duration: 28 minutes Provider: Glennie Isle, Psy.D.  Type of Session: Individual Therapy  Location of Patient: Work Location of Provider: Provider's Home Type of Contact: Telepsychological Visit via MyChart Video Visit  Session Content: Sheena [name] is a 54 y.o. female presenting via [place] Visit for a follow-up appointment to address the previously established treatment goal of increasing coping skills. Today's appointment was a telepsychological visit due to COVID-19. Sheena [name] provided verbal consent for today's telepsychological appointment and she is aware she is responsible for securing confidentiality on her end of the session. Prior to proceeding with today's appointment, Sheena [name]'s physical location at the time of this appointment was obtained as well a phone number she could be reached at in the event of technical difficulties. Sheena [name] and this provider participated in today's telepsychological service.   Sheena [name] acknowledged understanding that for today's appointment and future telepsychological appointments, MyChart will be utilized. She also verbally acknowledged understanding that the information outlined in the telepsychological informed consent form signed at the onset of treatment would still be applicable despite the change in the videoconferencing platform.   This provider conducted a brief check-in. Sheena [name] reported she reviewed shared handouts, noting she engages in emotional eating "more than" she realized. She further discussed "adjusting" to her loss, adding she has noticed her appetite has improved. Sheena [name] shared a belief that an improvement in appetite has made the meal plan challenging. This was further explored. Sheena [name] acknowledged eating breakfast congruent to her meal plan and described lunch and dinner have been challenging. It was reflected she is likely not consuming enough protein. Sheena [name]  was receptive to discussing the aforementioned further with Dr. Leafy Ro during their upcoming appointment (October 09, 2019) to explore other food options that fit her goals.   Psychoeducation regarding mindfulness was provided. A handout was provided to [facility] with further information regarding mindfulness, including exercises. This provider also explained the benefit of mindfulness as it relates to emotional eating. Sheena [name] was encouraged to engage in the provided exercises between now and the next appointment with this provider. Sheena [name] agreed. During today's appointment, Sheena [name] was led through a mindfulness exercise involving her senses. Sheena [name] provided verbal consent during today's appointment for this provider to send a handout about mindfulness via e-mail. Overall, Sheena [name] was receptive to today's appointment as evidenced by openness to sharing, responsiveness to feedback, and willingness to engage in mindfulness exercises to assist with coping.  Mental Status Examination:  Appearance: well groomed and appropriate hygiene  Behavior: appropriate to circumstances Mood: euthymic Affect: mood congruent Speech: normal in rate, volume, and tone Eye Contact: appropriate Psychomotor Activity: appropriate Gait: unable to assess Thought Process: linear, logical, and goal directed  Thought Content/Perception: no hallucinations, delusions, bizarre thinking or behavior reported or observed and no evidence of suicidal and homicidal ideation, plan, and intent Orientation: time, person, place and purpose of appointment Memory/Concentration: memory, attention, language, and fund of knowledge intact  Insight/Judgment: good  Interventions:  Conducted a brief chart review Provided empathic reflections and validation Employed supportive psychotherapy interventions to facilitate reduced distress and to improve coping skills with identified stressors Employed motivational interviewing skills to assess patient's  willingness/desire to adhere to recommended medical treatments and assignments Psychoeducation provided regarding mindfulness Engaged patient in mindfulness exercise(s) Employed acceptance and commitment interventions to emphasize mindfulness, acceptance without struggle, as well as value guided actions  DSM-5 Diagnosis(es): 311 (F32.8) Other Specified Depressive Disorder,  Emotional Eating Behaviors and 300.00 (F41.9) Unspecified Anxiety Disorder  Treatment Goal & Progress: During the initial appointment with this provider, the following treatment goal was established: increase coping skills. Sheena [name] has demonstrated progress in her goal as evidenced by increased awareness of hunger patterns and increased awareness of triggers for emotional eating. Sheena [name] also demonstrates willingness to engage in mindfulness exercises.  Plan: Due to this provider being out of the office in the coming weeks, the next appointment will be scheduled in approximately three weeks, which will be via [place] Visit. The next session will focus on working towards the established treatment goal.

## 2019-10-02 ENCOUNTER — Telehealth (INDEPENDENT_AMBULATORY_CARE_PROVIDER_SITE_OTHER): Payer: Commercial Managed Care - PPO | Admitting: Psychology

## 2019-10-02 DIAGNOSIS — F419 Anxiety disorder, unspecified: Secondary | ICD-10-CM | POA: Diagnosis not present

## 2019-10-02 DIAGNOSIS — F3289 Other specified depressive episodes: Secondary | ICD-10-CM | POA: Diagnosis not present

## 2019-10-09 ENCOUNTER — Ambulatory Visit (INDEPENDENT_AMBULATORY_CARE_PROVIDER_SITE_OTHER): Payer: Commercial Managed Care - PPO | Admitting: Family Medicine

## 2019-10-09 ENCOUNTER — Encounter (INDEPENDENT_AMBULATORY_CARE_PROVIDER_SITE_OTHER): Payer: Self-pay | Admitting: Family Medicine

## 2019-10-09 ENCOUNTER — Other Ambulatory Visit: Payer: Self-pay

## 2019-10-09 VITALS — BP 138/80 | HR 99 | Temp 97.6°F | Ht 62.0 in | Wt 238.0 lb

## 2019-10-09 DIAGNOSIS — R7303 Prediabetes: Secondary | ICD-10-CM

## 2019-10-09 DIAGNOSIS — Z6841 Body Mass Index (BMI) 40.0 and over, adult: Secondary | ICD-10-CM | POA: Diagnosis not present

## 2019-10-09 MED ORDER — METFORMIN HCL 500 MG PO TABS: 500.0000 mg | ORAL_TABLET | Freq: Every morning | ORAL | 0 refills | Status: DC

## 2019-10-13 NOTE — Progress Notes (Signed)
  Office: [phone]  /  Fax: [phone]    Date: Oct 24, 2019   Appointment Start Time: 10:06am Duration: 25 minutes Provider: Glennie Isle, Psy.D. Type of Session: Individual Therapy  Location of Patient: Work Location of Provider: Provider's Home Type of Contact: Telepsychological Visit via MyChart Video Visit  Session Content: Sheena [name] is a 54 y.o. female presenting via [place] Visit for a follow-up appointment to address the previously established treatment goal of increasing coping skills. Today's appointment was a telepsychological visit due to COVID-19. Dazja provided verbal consent for today's telepsychological appointment and she is aware she is responsible for securing confidentiality on her end of the session. Prior to proceeding with today's appointment, Sheena [name]'s physical location at the time of this appointment was obtained as well a phone number she could be reached at in the event of technical difficulties. Sheena [name] and this provider participated in today's telepsychological service.   This provider conducted a brief check-in. Sheena [name] shared about recent events and stressors. She shared about recent eating habits, noting she is trying to make better choices and engage in portion control. She also discussed feeling bored with the meal plan resulting in deviations. This was explored and processed. She acknowledged she often picks food to feel better due to current stressors. Psychoeducation regarding the importance of self-care utilizing the oxygen mask metaphor was provided. Additionally, psychoeducation regarding pleasurable activities, including its impact on emotional eating and overall well-being was provided. Sheena [name] was provided with a handout with various options of pleasurable activities, and was encouraged to engage in one activity a day and additional activities as needed when triggered to emotionally eat. Sheena [name] agreed. Sheena [name] provided verbal consent during today's appointment for this  provider to send a handout with pleasurable activities via e-mail. Sheena [name] was receptive to today's appointment as evidenced by openness to sharing, responsiveness to feedback, and willingness to engage in pleasurable activities to assist with coping.  Mental Status Examination:  Appearance: well groomed and appropriate hygiene  Behavior: appropriate to circumstances Mood: euthymic Affect: mood congruent Speech: normal in rate, volume, and tone Eye Contact: appropriate Psychomotor Activity: appropriate Gait: unable to assess Thought Process: linear, logical, and goal directed  Thought Content/Perception: no hallucinations, delusions, bizarre thinking or behavior reported or observed and no evidence of suicidal and homicidal ideation, plan, and intent Orientation: time, person, place and purpose of appointment Memory/Concentration: memory, attention, language, and fund of knowledge intact  Insight/Judgment: good  Interventions:  Conducted a brief chart review Provided empathic reflections and validation Employed supportive psychotherapy interventions to facilitate reduced distress and to improve coping skills with identified stressors Employed motivational interviewing skills to assess patient's willingness/desire to adhere to recommended medical treatments and assignments Psychoeducation provided regarding pleasurable activities Psychoeducation provided regarding self-care  DSM-5 Diagnosis(es): 311 (F32.8) Other Specified Depressive Disorder, Emotional Eating Behaviors and 300.00 (F41.9) Unspecified Anxiety Disorder  Treatment Goal & Progress: During the initial appointment with this provider, the following treatment goal was established: increase coping skills. Sheena [name] has demonstrated progress in her goal as evidenced by increased awareness of hunger patterns and increased awareness of triggers for emotional eating. Sheena [name] also continues to demonstrate willingness to engage in learned skill(s)  and demonstrates willingness to engage in pleasurable activities.  Plan: Per Sheena [name]'s request, the next appointment will be scheduled in approximately three weeks, which will be via [place] Visit. The next session will focus on working towards the established treatment goal.

## 2019-10-15 NOTE — Progress Notes (Signed)
Chief Complaint:   OBESITY Sheena [name] is here to discuss her progress with her obesity treatment plan along with follow-up of her obesity related diagnoses. Sheena [name] is on the Category 2 Plan and states she is following her eating plan approximately 70% of the time. Sheena [name] states she is more mindful of her steps.  Today's visit was #: 3 Starting weight: 250 lbs Starting date: 08/29/2019 Today's weight: 238 lbs Today's date: 10/09/2019 Total lbs lost to date: 12 Total lbs lost since last in-office visit: 6  Interim History: Sheena [name] continues to do well with weight loss on her Category 2 plan. She notes increase in stress at work and she is struggling with meal planning.  Subjective:   1. Pre-diabetes Sheena [name]'s last A1c was 5.9. She is tolerating metformin well.  Assessment/Plan:   1. Pre-diabetes Sheena [name] will continue to work on weight loss, exercise, and decreasing simple carbohydrates to help decrease the risk of diabetes. We will refill metformin for 1 month.  - metFORMIN (GLUCOPHAGE) 500 MG tablet; Take 1 tablet (500 mg total) by mouth every morning.  Dispense: 30 tablet; Refill: 0  2. Class 3 severe obesity with serious comorbidity and body mass index (BMI) of 40.0 to 44.9 in adult, unspecified obesity type (HCC) Sheena [name] is currently in the action stage of change. As such, her goal is to continue with weight loss efforts. She has agreed to the Category 2 Plan with lunch options.   Exercise goals: As is.  Behavioral modification strategies: increasing lean protein intake, meal planning and cooking strategies and emotional eating strategies.  Sheena [name] has agreed to follow-up with our clinic in 2 weeks. She was informed of the importance of frequent follow-up visits to maximize her success with intensive lifestyle modifications for her multiple health conditions.   Objective:   Blood pressure 138/80, pulse 99, temperature 97.6 F (36.4 C), temperature source Oral, height 5\' 2"  (1.575 m), weight 238  lb (108 kg), last menstrual period 01/29/2017, SpO2 99 %. Body mass index is 43.53 kg/m.  General: Cooperative, alert, well developed, in no acute distress. HEENT: Conjunctivae and lids unremarkable. Cardiovascular: Regular rhythm.  Lungs: Normal work of breathing. Neurologic: No focal deficits.   Lab Results  Component Value Date   CREATININE 1.00 08/29/2019   BUN 14 08/29/2019   NA 139 08/29/2019   K 4.5 08/29/2019   CL 104 08/29/2019   CO2 22 08/29/2019   Lab Results  Component Value Date   ALT 25 08/29/2019   AST 25 08/29/2019   ALKPHOS 107 08/29/2019   BILITOT 0.4 08/29/2019   Lab Results  Component Value Date   HGBA1C 5.9 (H) 08/29/2019   Lab Results  Component Value Date   INSULIN 15.0 08/29/2019   Lab Results  Component Value Date   TSH 0.427 (L) 08/29/2019   Lab Results  Component Value Date   CHOL 186 08/29/2019   HDL 42 08/29/2019   LDLCALC 114 (H) 08/29/2019   TRIG 170 (H) 08/29/2019   Lab Results  Component Value Date   WBC 11.4 (H) 08/29/2019   HGB 13.7 08/29/2019   HCT 43.6 08/29/2019   MCV 80 08/29/2019   PLT 342 08/29/2019   No results found for: IRON, TIBC, FERRITIN  Attestation Statements:   Reviewed by clinician on day of visit: allergies, medications, problem list, medical history, surgical history, family history, social history, and previous encounter notes.  Time spent on visit including pre-visit chart review and post-visit care and charting was 35  minutes.    I, Trixie Dredge, am acting as transcriptionist for Dennard Nip, MD.  I have reviewed the above documentation for accuracy and completeness, and I agree with the above. -  Dennard Nip, MD

## 2019-10-22 ENCOUNTER — Other Ambulatory Visit: Payer: Self-pay | Admitting: Obstetrics and Gynecology

## 2019-10-23 ENCOUNTER — Ambulatory Visit (INDEPENDENT_AMBULATORY_CARE_PROVIDER_SITE_OTHER): Payer: Commercial Managed Care - PPO | Admitting: Family Medicine

## 2019-10-23 ENCOUNTER — Encounter (INDEPENDENT_AMBULATORY_CARE_PROVIDER_SITE_OTHER): Payer: Self-pay | Admitting: Family Medicine

## 2019-10-23 ENCOUNTER — Other Ambulatory Visit: Payer: Self-pay

## 2019-10-23 VITALS — BP 121/80 | HR 90 | Temp 97.6°F | Ht 62.0 in | Wt 237.0 lb

## 2019-10-23 DIAGNOSIS — Z6841 Body Mass Index (BMI) 40.0 and over, adult: Secondary | ICD-10-CM

## 2019-10-23 DIAGNOSIS — R7303 Prediabetes: Secondary | ICD-10-CM

## 2019-10-23 DIAGNOSIS — E66813 Obesity, class 3: Secondary | ICD-10-CM

## 2019-10-23 DIAGNOSIS — E559 Vitamin D deficiency, unspecified: Secondary | ICD-10-CM | POA: Diagnosis not present

## 2019-10-23 HISTORY — PX: INJECTION KNEE: SHX2446

## 2019-10-23 NOTE — Progress Notes (Signed)
Chief Complaint:   OBESITY [place] is here to discuss her progress with her obesity treatment plan along with follow-up of her obesity related diagnoses. Lanyia is on the Category 2 Plan and states she is following her eating plan approximately 60% of the time. Jamilette states she is doing 0 minutes 0 times per week.  Today's visit was #: 4 Starting weight: 250 lbs Starting date: 08/29/2019 Today's weight: 237 lbs Today's date: 10/23/2019 Total lbs lost to date: 13 Total lbs lost since last in-office visit: 1  Interim History: Rukia struggled on the meal plan the last few weeks due to stress, with work and her mother has a new roommate at the nursing home. Her son has brought fast food 5-6 times the last 2 weeks, however she got back on the plan the following meal each time.  Subjective:   1. Vitamin D deficiency Brandy's Vit D level on 08/29/2019 was 42.2. She is on prescription strength Vit D supplementation.  2. Pre-diabetes Sherlyne's A1c on 08/29/2019 was 5.9. She is on metformin 500 mg every morning with breakfast, and she is tolerating it well.  Assessment/Plan:   1. Vitamin D deficiency Low Vitamin D level contributes to fatigue and are associated with obesity, breast, and colon cancer. Twylla agreed to continue taking prescription Vitamin D 50,000 IU every week and will follow-up for routine testing of Vitamin D, at least 2-3 times per year to avoid over-replacement. We will recheck labs in June 2021.  2. Pre-diabetes Lateria will continue metformin and will continue her Category 2 meal plan. She will continue to work on weight loss, exercise, and decreasing simple carbohydrates to help decrease the risk of diabetes. We will recheck labs in June 2021.  3. Class 3 severe obesity with serious comorbidity and body mass index (BMI) of 40.0 to 44.9 in adult, unspecified obesity type (HCC) Charlye is currently in the action stage of change. As such, her goal is to continue with weight loss efforts. She  has agreed to the Category 2 Plan with additional breakfast options.   Exercise goals: No exercise has been prescribed at this time.  Behavioral modification strategies: increasing lean protein intake, increasing water intake and no skipping meals.  Oceane has agreed to follow-up with our clinic in 2 weeks. She was informed of the importance of frequent follow-up visits to maximize her success with intensive lifestyle modifications for her multiple health conditions.   Objective:   Blood pressure 121/80, pulse 90, temperature 97.6 F (36.4 C), temperature source Oral, height 5\' 2"  (1.575 m), weight 237 lb (107.5 kg), last menstrual period 01/29/2017, SpO2 99 %. Body mass index is 43.35 kg/m.  General: Cooperative, alert, well developed, in no acute distress. HEENT: Conjunctivae and lids unremarkable. Cardiovascular: Regular rhythm.  Lungs: Normal work of breathing. Neurologic: No focal deficits.   Lab Results  Component Value Date   CREATININE 1.00 08/29/2019   BUN 14 08/29/2019   NA 139 08/29/2019   K 4.5 08/29/2019   CL 104 08/29/2019   CO2 22 08/29/2019   Lab Results  Component Value Date   ALT 25 08/29/2019   AST 25 08/29/2019   ALKPHOS 107 08/29/2019   BILITOT 0.4 08/29/2019   Lab Results  Component Value Date   HGBA1C 5.9 (H) 08/29/2019   Lab Results  Component Value Date   INSULIN 15.0 08/29/2019   Lab Results  Component Value Date   TSH 0.427 (L) 08/29/2019   Lab Results  Component Value  Date   CHOL 186 08/29/2019   HDL 42 08/29/2019   LDLCALC 114 (H) 08/29/2019   TRIG 170 (H) 08/29/2019   Lab Results  Component Value Date   WBC 11.4 (H) 08/29/2019   HGB 13.7 08/29/2019   HCT 43.6 08/29/2019   MCV 80 08/29/2019   PLT 342 08/29/2019   No results found for: IRON, TIBC, FERRITIN  Attestation Statements:   Reviewed by clinician on day of visit: allergies, medications, problem list, medical history, surgical history, family history, social history,  and previous encounter notes.  Time spent on visit including pre-visit chart review and post-visit care and charting was 24 minutes.    I, Trixie Dredge, am acting as transcriptionist for Dennard Nip, MD.  I have reviewed the above documentation for accuracy and completeness, and I agree with the above. -  Dennard Nip, MD

## 2019-10-24 ENCOUNTER — Encounter (HOSPITAL_BASED_OUTPATIENT_CLINIC_OR_DEPARTMENT_OTHER): Payer: Self-pay | Admitting: Obstetrics and Gynecology

## 2019-10-24 ENCOUNTER — Other Ambulatory Visit: Payer: Self-pay

## 2019-10-24 ENCOUNTER — Telehealth (INDEPENDENT_AMBULATORY_CARE_PROVIDER_SITE_OTHER): Payer: Commercial Managed Care - PPO | Admitting: Psychology

## 2019-10-24 DIAGNOSIS — F3289 Other specified depressive episodes: Secondary | ICD-10-CM | POA: Diagnosis not present

## 2019-10-24 DIAGNOSIS — F419 Anxiety disorder, unspecified: Secondary | ICD-10-CM

## 2019-10-24 NOTE — Progress Notes (Addendum)
Addendum : spoke with [place] zanetto pa ok to proceed  Spoke w/ via phone for pre-op interview---patient Lab needs dos--- type and screen             Lab results------ekg 08-29-2019 epic, sent to [place] [facility] pa for review COVID test ------10-28-2019 at 1220 ap Arrive at -------1045 am 10-31-2019 NPO after ------midnight Medications to take morning of surgery -----alprazolam prn, topirimate, levothyroxine, omeprazole Diabetic medication -----none day of surgery Patient Special Instructions -----none Pre-Op special Istructions -----none Patient verbalized understanding of instructions that were given at this phone interview. Patient denies shortness of breath, chest pain, fever, cough a this phone interview.

## 2019-10-28 ENCOUNTER — Other Ambulatory Visit: Payer: Self-pay

## 2019-10-28 ENCOUNTER — Other Ambulatory Visit (HOSPITAL_COMMUNITY)
Admission: RE | Admit: 2019-10-28 | Discharge: 2019-10-28 | Disposition: A | Discharge status: Home / Self Care | Payer: Commercial Managed Care - PPO | Source: Ambulatory Visit | Attending: Obstetrics and Gynecology | Admitting: Obstetrics and Gynecology

## 2019-10-28 DIAGNOSIS — Z20822 Contact with and (suspected) exposure to covid-19: Secondary | ICD-10-CM | POA: Diagnosis not present

## 2019-10-28 DIAGNOSIS — Z01812 Encounter for preprocedural laboratory examination: Secondary | ICD-10-CM | POA: Diagnosis present

## 2019-10-28 LAB — SARS CORONAVIRUS 2 (TAT 6-24 HRS): SARS Coronavirus 2: NEGATIVE

## 2019-10-29 ENCOUNTER — Other Ambulatory Visit: Payer: Self-pay | Admitting: Obstetrics and Gynecology

## 2019-10-29 NOTE — Progress Notes (Signed)
  Office: [phone]  /  Fax: [phone]    Date: Nov 12, 2019   Appointment Start Time: 9:59am Duration: 19 minutes Provider: Glennie Isle, Psy.D. Type of Session: Individual Therapy  Location of Patient: Work Location of Provider: Provider's Home Type of Contact: Telepsychological Visit via MyChart Video Visit  Session Content: Sheena [name] is a 54 y.o. female presenting via [place] Visit for a follow-up appointment to address the previously established treatment goal of increasing coping skills. Today's appointment was a telepsychological visit due to COVID-19. Sheena [name] provided verbal consent for today's telepsychological appointment and she is aware she is responsible for securing confidentiality on her end of the session. Prior to proceeding with today's appointment, Sheena [name]'s physical location at the time of this appointment was obtained as well a phone number she could be reached at in the event of technical difficulties. Sheena [name] and this provider participated in today's telepsychological service.   This provider conducted a brief check-in. Sheena [name] shared about recent events, including her husband's birthday and a medical procedure. She noted an improvement in eating habits and emotional eating. Sheena [name] also discussed engaging in pleasurable activities. Positive reinforcement was provided. Session focused further on mindfulness to assist with coping. She acknowledged she has not engaged in mindfulness exercises. Sheena [name] was led through a mindfulness exercise (A Taste of Mindfulness) and her experience was processed. Sheena [name] provided verbal consent during today's appointment for this provider to send the handout for today's exercise via e-mail. This provider also discussed the utilization of YouTube for mindfulness exercises (e.g., exercises by Merri Ray). Furthermore, termination planning was discussed. Sheena [name] was receptive to a follow-up appointment in 3-4 weeks and an additional follow-up/termination  appointment in 3-4 weeks after that. Sheena [name] was receptive to today's appointment as evidenced by openness to sharing, responsiveness to feedback, and willingness to continue engaging in mindfulness exercises.  Mental Status Examination:  Appearance: well groomed and appropriate hygiene  Behavior: appropriate to circumstances Mood: euthymic Affect: mood congruent Speech: normal in rate, volume, and tone Eye Contact: appropriate Psychomotor Activity: appropriate Gait: unable to assess Thought Process: linear, logical, and goal directed  Thought Content/Perception: no hallucinations, delusions, bizarre thinking or behavior reported or observed and no evidence of suicidal and homicidal ideation, plan, and intent Orientation: time, person, place and purpose of appointment Memory/Concentration: memory, attention, language, and fund of knowledge intact  Insight/Judgment: good  Interventions:  Conducted a brief chart review Provided empathic reflections and validation Reviewed content from the previous session Provided positive reinforcement Employed supportive psychotherapy interventions to facilitate reduced distress and to improve coping skills with identified stressors Employed motivational interviewing skills to assess patient's willingness/desire to adhere to recommended medical treatments and assignments Engaged patient in mindfulness exercise(s) Employed acceptance and commitment interventions to emphasize mindfulness and acceptance without struggle Discussed termination planning  DSM-5 Diagnosis(es): 311 (F32.8) Other Specified Depressive Disorder, Emotional Eating Behaviors and 300.00 (F41.9) Unspecified Anxiety Disorder  Treatment Goal & Progress: During the initial appointment with this provider, the following treatment goal was established: increase coping skills. Sheena [name] has demonstrated progress in her goal as evidenced by increased awareness of hunger patterns, increased awareness of  triggers for emotional eating and reduction in emotional eating. Sheena [name] also continues to demonstrate willingness to engage in learned skill(s).  Plan: The next appointment will be scheduled in one month, which will be via MyChart Video Visit. The next session will focus on working towards the established treatment goal.

## 2019-10-30 NOTE — Anesthesia Preprocedure Evaluation (Addendum)
Anesthesia Evaluation  Patient identified by MRN, date of birth, ID band Patient awake    Reviewed: Allergy & Precautions, NPO status , Patient's Chart, lab work & pertinent test results  Airway Mallampati: II  TM Distance: >3 FB Neck ROM: Full    Dental no notable dental hx. (+) Chipped, Teeth Intact, Dental Advisory Given,    Pulmonary neg pulmonary ROS, Current Smoker and Patient abstained from smoking.,    Pulmonary exam normal breath sounds clear to auscultation       Cardiovascular Exercise Tolerance: Good Normal cardiovascular exam Rhythm:Regular Rate:Normal     Neuro/Psych Anxiety negative neurological ROS     GI/Hepatic Neg liver ROS, GERD  ,  Endo/Other  diabetes, Type 2Hypothyroidism Morbid obesity  Renal/GU      Musculoskeletal  (+) Arthritis ,   Abdominal (+) + obese,   Peds  Hematology  (+) Blood dyscrasia, anemia ,   Anesthesia Other Findings   Reproductive/Obstetrics negative OB ROS                            Anesthesia Physical Anesthesia Plan  ASA: III  Anesthesia Plan: General   Post-op Pain Management:    Induction: Intravenous  PONV Risk Score and Plan: 3 and Treatment may vary due to age or medical condition, Ondansetron, Dexamethasone and Midazolam  Airway Management Planned: LMA  Additional Equipment: None  Intra-op Plan:   Post-operative Plan:   Informed Consent: I have reviewed the patients History and Physical, chart, labs and discussed the procedure including the risks, benefits and alternatives for the proposed anesthesia with the patient or authorized representative who has indicated his/her understanding and acceptance.     Dental advisory given  Plan Discussed with:   Anesthesia Plan Comments: (53 F w DUB for [facility] hysteroscopy)       Anesthesia Quick Evaluation

## 2019-10-31 ENCOUNTER — Encounter (HOSPITAL_BASED_OUTPATIENT_CLINIC_OR_DEPARTMENT_OTHER)
Admission: RE | Disposition: A | Discharge status: Home / Self Care | Payer: Self-pay | Source: Home / Self Care | Attending: Obstetrics and Gynecology

## 2019-10-31 ENCOUNTER — Ambulatory Visit (HOSPITAL_BASED_OUTPATIENT_CLINIC_OR_DEPARTMENT_OTHER): Payer: Commercial Managed Care - PPO | Admitting: Physician Assistant

## 2019-10-31 ENCOUNTER — Encounter (HOSPITAL_BASED_OUTPATIENT_CLINIC_OR_DEPARTMENT_OTHER): Payer: Self-pay | Admitting: Obstetrics and Gynecology

## 2019-10-31 ENCOUNTER — Ambulatory Visit (HOSPITAL_BASED_OUTPATIENT_CLINIC_OR_DEPARTMENT_OTHER)
Admission: RE | Admit: 2019-10-31 | Discharge: 2019-10-31 | Disposition: A | Discharge status: Home / Self Care | Payer: Commercial Managed Care - PPO | Attending: Obstetrics and Gynecology | Admitting: Obstetrics and Gynecology

## 2019-10-31 DIAGNOSIS — E039 Hypothyroidism, unspecified: Secondary | ICD-10-CM | POA: Insufficient documentation

## 2019-10-31 DIAGNOSIS — E119 Type 2 diabetes mellitus without complications: Secondary | ICD-10-CM | POA: Insufficient documentation

## 2019-10-31 DIAGNOSIS — N858 Other specified noninflammatory disorders of uterus: Secondary | ICD-10-CM | POA: Diagnosis not present

## 2019-10-31 DIAGNOSIS — Z6841 Body Mass Index (BMI) 40.0 and over, adult: Secondary | ICD-10-CM | POA: Diagnosis not present

## 2019-10-31 DIAGNOSIS — F419 Anxiety disorder, unspecified: Secondary | ICD-10-CM | POA: Insufficient documentation

## 2019-10-31 DIAGNOSIS — Z79899 Other long term (current) drug therapy: Secondary | ICD-10-CM | POA: Insufficient documentation

## 2019-10-31 DIAGNOSIS — Z79890 Hormone replacement therapy (postmenopausal): Secondary | ICD-10-CM | POA: Insufficient documentation

## 2019-10-31 DIAGNOSIS — F329 Major depressive disorder, single episode, unspecified: Secondary | ICD-10-CM | POA: Insufficient documentation

## 2019-10-31 DIAGNOSIS — E559 Vitamin D deficiency, unspecified: Secondary | ICD-10-CM | POA: Insufficient documentation

## 2019-10-31 DIAGNOSIS — K219 Gastro-esophageal reflux disease without esophagitis: Secondary | ICD-10-CM | POA: Insufficient documentation

## 2019-10-31 DIAGNOSIS — F909 Attention-deficit hyperactivity disorder, unspecified type: Secondary | ICD-10-CM | POA: Insufficient documentation

## 2019-10-31 DIAGNOSIS — Z7984 Long term (current) use of oral hypoglycemic drugs: Secondary | ICD-10-CM | POA: Insufficient documentation

## 2019-10-31 DIAGNOSIS — N950 Postmenopausal bleeding: Secondary | ICD-10-CM | POA: Diagnosis present

## 2019-10-31 DIAGNOSIS — F17210 Nicotine dependence, cigarettes, uncomplicated: Secondary | ICD-10-CM | POA: Insufficient documentation

## 2019-10-31 HISTORY — PX: HYSTEROSCOPY WITH D & C: SHX1775

## 2019-10-31 HISTORY — DX: Attention-deficit hyperactivity disorder, unspecified type: F90.9

## 2019-10-31 HISTORY — DX: Postmenopausal bleeding: N95.0

## 2019-10-31 HISTORY — DX: Prediabetes: R73.03

## 2019-10-31 LAB — ABO/RH: ABO/RH(D): A POS

## 2019-10-31 LAB — TYPE AND SCREEN
ABO/RH(D): A POS
Antibody Screen: NEGATIVE

## 2019-10-31 LAB — POCT PREGNANCY, URINE: Preg Test, Ur: NEGATIVE

## 2019-10-31 SURGERY — DILATATION AND CURETTAGE /HYSTEROSCOPY
Anesthesia: General | Site: Vagina

## 2019-10-31 MED ORDER — PROPOFOL 10 MG/ML IV BOLUS
INTRAVENOUS | Status: AC
  Filled 2019-10-31: qty 20

## 2019-10-31 MED ORDER — HYDROCODONE-ACETAMINOPHEN 5-325 MG PO TABS: 1.0000 | ORAL_TABLET | Freq: Four times a day (QID) | ORAL | 0 refills | Status: DC | PRN

## 2019-10-31 MED ORDER — SCOPOLAMINE 1 MG/3DAYS TD PT72
MEDICATED_PATCH | TRANSDERMAL | Status: AC
  Filled 2019-10-31: qty 1

## 2019-10-31 MED ORDER — FENTANYL CITRATE (PF) 100 MCG/2ML IJ SOLN
INTRAMUSCULAR | Status: DC | PRN
  Administered 2019-10-31: 25 ug via INTRAVENOUS
  Administered 2019-10-31: 50 ug via INTRAVENOUS
  Administered 2019-10-31: 25 ug via INTRAVENOUS

## 2019-10-31 MED ORDER — SODIUM CHLORIDE 0.9 % IR SOLN
Status: DC | PRN
  Administered 2019-10-31: 3000 mL

## 2019-10-31 MED ORDER — ONDANSETRON HCL 4 MG/2ML IJ SOLN
INTRAMUSCULAR | Status: AC
  Filled 2019-10-31: qty 2

## 2019-10-31 MED ORDER — DEXAMETHASONE SODIUM PHOSPHATE 10 MG/ML IJ SOLN
INTRAMUSCULAR | Status: DC | PRN
  Administered 2019-10-31: 10 mg via INTRAVENOUS

## 2019-10-31 MED ORDER — LIDOCAINE 2% (20 MG/ML) 5 ML SYRINGE
INTRAMUSCULAR | Status: DC | PRN
  Administered 2019-10-31: 80 mg via INTRAVENOUS

## 2019-10-31 MED ORDER — LIDOCAINE HCL 1 % IJ SOLN
INTRAMUSCULAR | Status: DC | PRN
  Administered 2019-10-31: 10 mL

## 2019-10-31 MED ORDER — MIDAZOLAM HCL 2 MG/2ML IJ SOLN
INTRAMUSCULAR | Status: AC
  Filled 2019-10-31: qty 2

## 2019-10-31 MED ORDER — IBUPROFEN 600 MG PO TABS
600.0000 mg | ORAL_TABLET | Freq: Four times a day (QID) | ORAL | 0 refills | Status: DC | PRN
Start: 2019-10-31 — End: 2020-09-15

## 2019-10-31 MED ORDER — PROPOFOL 10 MG/ML IV BOLUS
INTRAVENOUS | Status: DC | PRN
  Administered 2019-10-31: 180 mg via INTRAVENOUS

## 2019-10-31 MED ORDER — SCOPOLAMINE 1 MG/3DAYS TD PT72
1.0000 | MEDICATED_PATCH | Freq: Once | TRANSDERMAL | Status: DC
  Administered 2019-10-31: 1.5 mg via TRANSDERMAL

## 2019-10-31 MED ORDER — CHLORHEXIDINE GLUCONATE 0.12 % MT SOLN: 15.0000 mL | Freq: Once | OROMUCOSAL | Status: DC

## 2019-10-31 MED ORDER — DEXAMETHASONE SODIUM PHOSPHATE 10 MG/ML IJ SOLN
INTRAMUSCULAR | Status: AC
  Filled 2019-10-31: qty 1

## 2019-10-31 MED ORDER — LACTATED RINGERS IV SOLN: INTRAVENOUS | Status: DC

## 2019-10-31 MED ORDER — ORAL CARE MOUTH RINSE: 15.0000 mL | Freq: Once | OROMUCOSAL | Status: DC

## 2019-10-31 MED ORDER — ONDANSETRON HCL 4 MG/2ML IJ SOLN
INTRAMUSCULAR | Status: DC | PRN
  Administered 2019-10-31: 4 mg via INTRAVENOUS

## 2019-10-31 MED ORDER — FENTANYL CITRATE (PF) 100 MCG/2ML IJ SOLN
INTRAMUSCULAR | Status: AC
  Filled 2019-10-31: qty 2

## 2019-10-31 MED ORDER — LIDOCAINE 2% (20 MG/ML) 5 ML SYRINGE
INTRAMUSCULAR | Status: AC
  Filled 2019-10-31: qty 5

## 2019-10-31 MED ORDER — MIDAZOLAM HCL 5 MG/5ML IJ SOLN
INTRAMUSCULAR | Status: DC | PRN
  Administered 2019-10-31: 2 mg via INTRAVENOUS

## 2019-10-31 MED ORDER — ACETAMINOPHEN 500 MG PO TABS: 1000.0000 mg | ORAL_TABLET | Freq: Once | ORAL | Status: DC

## 2019-10-31 SURGICAL SUPPLY — 17 items
BIPOLAR CUTTING LOOP 21FR (ELECTRODE)
CANISTER SUCT 3000ML PPV (MISCELLANEOUS) ×3 IMPLANT
CATH ROBINSON RED A/P 16FR (CATHETERS) ×3 IMPLANT
COVER WAND RF STERILE (DRAPES) ×3 IMPLANT
DILATOR CANAL MILEX (MISCELLANEOUS) IMPLANT
GLOVE BIO SURGEON STRL SZ7 (GLOVE) ×3 IMPLANT
GOWN STRL REUS W/TWL LRG LVL3 (GOWN DISPOSABLE) ×3 IMPLANT
IV NS IRRIG 3000ML ARTHROMATIC (IV SOLUTION) ×3 IMPLANT
KIT PROCEDURE FLUENT (KITS) ×3 IMPLANT
KIT TURNOVER CYSTO (KITS) ×3 IMPLANT
LOOP CUTTING BIPOLAR 21FR (ELECTRODE) IMPLANT
PACK VAGINAL MINOR WOMEN LF (CUSTOM PROCEDURE TRAY) ×3 IMPLANT
PAD OB MATERNITY 4.3X12.25 (PERSONAL CARE ITEMS) ×3 IMPLANT
PAD PREP 24X48 CUFFED NSTRL (MISCELLANEOUS) ×3 IMPLANT
SEAL ROD LENS SCOPE MYOSURE (ABLATOR) ×2 IMPLANT
TOWEL OR 17X26 10 PK STRL BLUE (TOWEL DISPOSABLE) ×4 IMPLANT
WATER STERILE IRR 500ML POUR (IV SOLUTION) ×3 IMPLANT

## 2019-10-31 NOTE — Anesthesia Procedure Notes (Signed)
Procedure Name: Intubation Date/Time: 10/31/2019 12:41 PM Performed by: Modean Mccullum D, CRNA Pre-anesthesia Checklist: Patient identified, Emergency Drugs available, Suction available and Patient being monitored Patient Re-evaluated:Patient Re-evaluated prior to induction Oxygen Delivery Method: Circle system utilized Preoxygenation: Pre-oxygenation with 100% oxygen Induction Type: IV induction Ventilation: Mask ventilation without difficulty LMA Size: 4.0 Tube type: Oral Number of attempts: 1 Placement Confirmation: positive ETCO2 and breath sounds checked- equal and bilateral Tube secured with: Tape Dental Injury: Teeth and Oropharynx as per pre-operative assessment

## 2019-10-31 NOTE — H&P (Signed)
Sheena [name] is an 54 y.o. female.  54 yo presents for surgical management of PMB  Pt had episode of PMB, US showed endo thickness of 5.72mm. Endometrial bx was benign but showed  proliferative endometrium which would be unexpected for a post menopausal woman therefore the recommendation was made to proceed with complete uterine sampling. R/B/A reviewed and the patient wishes to proceed.    Patient's last menstrual period was 01/29/2017.    Past Medical History:  Diagnosis Date  . ADHD   . Anemia   . Anxiety   . Back pain   . Depression   . GERD (gastroesophageal reflux disease)   . High cholesterol   . IC (interstitial cystitis)   . Interstitial cystitis   . Knee pain, bilateral   . Obesity   . Osteoarthritis   . PMB (postmenopausal bleeding)   . Pre-diabetes   . STD (sexually transmitted disease)    HSV ll  . Thyroid disease    Hypothyroid  . Vitamin D deficiency     Past Surgical History:  Procedure Laterality Date  . CESAREAN SECTION     x 1  . colonscopy    . FOOT SURGERY Right   . INJECTION KNEE Left 10/23/2019   versi knee gel injection and 10-30-19 to be done  . WISDOM TOOTH EXTRACTION  2000    Family History  Problem Relation Age of Onset  . Diabetes Father   . Hypertension Father   . Melanoma Father        melanoma  . Colon polyps Father   . Crohn's disease Father   . Cancer Father        ? type  . Hyperlipidemia Father   . Thyroid disease Father   . Obesity Father   . Rheum arthritis Mother   . Hyperlipidemia Mother   . Thyroid disease Mother   . Obesity Mother   . Breast cancer Cousin 63       Pat 1st cousin    Social History:  reports that she has been smoking cigarettes. She has a 20.00 pack-year smoking history. She has never used smokeless tobacco. She reports that she does not drink alcohol or use drugs.  Allergies:  Allergies  Allergen Reactions  . Sulfa Antibiotics Swelling    Medications Prior to Admission  Medication Sig  Dispense Refill Last Dose  . ALPRAZolam (XANAX) 0.5 MG tablet 2 (two) times daily as needed.    Past Month at Unknown time  . DULoxetine (CYMBALTA) 30 MG capsule Take 1 capsule (30 mg total) by mouth daily. (Patient taking differently: Take 30 mg by mouth at bedtime. ) 30 capsule 0 10/30/2019 at Unknown time  . FENOFIBRATE PO Take 160 mg by mouth every evening.    10/30/2019 at Unknown time  . levothyroxine (EUTHYROX) 175 MCG tablet Euthyrox 175 mcg tablet   10/31/2019 at 0830  . lisdexamfetamine (VYVANSE) 30 MG capsule Take 30 mg by mouth daily.   10/30/2019 at Unknown time  . metFORMIN (GLUCOPHAGE) 500 MG tablet Take 1 tablet (500 mg total) by mouth every morning. 30 tablet 0 10/30/2019 at Unknown time  . Omeprazole (PRILOSEC PO) Take 20 mg by mouth daily.    10/31/2019 at 0830  . TOPIRAMATE PO Take 50 mg by mouth daily.    10/31/2019 at 0800  . traZODone (DESYREL) 50 MG tablet at bedtime.    10/30/2019 at Unknown time  . Vitamin D, Ergocalciferol, (DRISDOL) 1.25 MG (50000 UNIT) CAPS capsule  Take 50,000 Units by mouth every 7 (seven) days. sunday   Past Week at Unknown time    Review of Systems  Blood pressure (!) 160/93, pulse (!) 102, temperature 97.7 F (36.5 C), temperature source Oral, resp. rate 18, height 5\' 2"  (1.575 m), weight 108.2 kg, last menstrual period 01/29/2017, SpO2 99 %. Physical Exam  AOx3 NAD Normal work of breathing Abdomen soft  Results for orders placed or performed during the hospital encounter of 10/31/19 (from the past 24 hour(s))  Pregnancy, urine POC     Status: None   Collection Time: 10/31/19 10:53 AM  Result Value Ref Range   Preg Test, Ur NEGATIVE NEGATIVE    No results found.  Assessment/Plan: 1) Proceed with hysteroscopy, D&C 2) SCDs 3) no Abx required  Vanessa Kick 10/31/2019, 11:53 AM

## 2019-10-31 NOTE — Op Note (Signed)
Pre-Operative Diagnosis: Postmenopausal bleeding Postoperative Diagnosis: Postmenopausal bleeding Procedure: Hysteroscopy, dilation and curettage Surgeon: Dr. Vanessa Kick Assistant: None Operative Findings: Normal appearing endometrial cavity with atrophic appearing endometrium.  Bilateral tubal ostia visualized.  Fluid deficit 65 cc Specimen: Scant endometrial curettings EBL: Minimal  Sheena [name] Is a 54 year old female who presents for definitive surgical management for postmenopausal bleeding. Please see the patient's history and physical for complete details of the history. Management options were discussed with the patient. R/B/A reviewed. Following appropriate informed consent was taken to the operating room. The patient was appropriately identified during a time out procedure.  General with LMA anesthesia was administered and the patient was placed in the dorsal lithotomy position. The patient was prepped and draped in the normal sterile fashion. A speculum was placed into the vagina, a single-tooth tenaculum was placed on the anterior lip of the cervix, and 10 cc of 1% lidocaine was administered in a paracervical fashion. The cervix was serially dilated with Kennon Rounds dilators.  The hysteroscope was introduced through the cervix for the above findings.  A sharp curettage was performed x2 with resultant scant endometrial curettings.  This completed the surgical procedure.  The patient was transferred to the PACU in stable condition following the procedure.

## 2019-10-31 NOTE — Discharge Instructions (Signed)
  Post Anesthesia Home Care Instructions  Activity: Get plenty of rest for the remainder of the day. A responsible adult should stay with you for 24 hours following the procedure.  For the next 24 hours, DO NOT: -Drive a car -Operate machinery -Drink alcoholic beverages -Take any medication unless instructed by your physician -Make any legal decisions or sign important papers.  Meals: Start with liquid foods such as gelatin or soup. Progress to regular foods as tolerated. Avoid greasy, spicy, heavy foods. If nausea and/or vomiting occur, drink only clear liquids until the nausea and/or vomiting subsides. Call your physician if vomiting continues.  Special Instructions/Symptoms: Your throat may feel dry or sore from the anesthesia or the breathing tube placed in your throat during surgery. If this causes discomfort, gargle with warm salt water. The discomfort should disappear within 24 hours.  If you had a scopolamine patch placed behind your ear for the management of post- operative nausea and/or vomiting:  1. The medication in the patch is effective for 72 hours, after which it should be removed.  Wrap patch in a tissue and discard in the trash. Wash hands thoroughly with soap and water. 2. You may remove the patch earlier than 72 hours if you experience unpleasant side effects which may include dry mouth, dizziness or visual disturbances. 3. Avoid touching the patch. Wash your hands with soap and water after contact with the patch.   DISCHARGE INSTRUCTIONS: D&C / D&E The following instructions have been prepared to help you care for yourself upon your return home.   Personal hygiene: . Use sanitary pads for vaginal drainage, not tampons. . Shower the day after your procedure. . NO tub baths, pools or Jacuzzis for 2-3 weeks. . Wipe front to back after using the bathroom.  Activity and limitations: . Do NOT drive or operate any equipment for 24 hours. The effects of anesthesia are  still present and drowsiness may result. . Do NOT rest in bed all day. . Walking is encouraged. . Walk up and down stairs slowly. . You may resume your normal activity in one to two days or as indicated by your physician.  Sexual activity: NO intercourse for at least 2 weeks after the procedure, or as indicated by your physician.  Diet: Eat a light meal as desired this evening. You may resume your usual diet tomorrow.  Return to work: You may resume your work activities in one to two days or as indicated by your doctor.  What to expect after your surgery: Expect to have vaginal bleeding/discharge for 2-3 days and spotting for up to 10 days. It is not unusual to have soreness for up to 1-2 weeks. You may have a slight burning sensation when you urinate for the first day. Mild cramps may continue for a couple of days. You may have a regular period in 2-6 weeks.  Call your doctor for any of the following: . Excessive vaginal bleeding, saturating and changing one pad every hour. . Inability to urinate 6 hours after discharge from hospital. . Pain not relieved by pain medication. . Fever of 100.4 F or greater. . Unusual vaginal discharge or odor.   Call for an appointment:      

## 2019-10-31 NOTE — Transfer of Care (Signed)
Immediate Anesthesia Transfer of Care Note  Patient: Sheena [name]  Procedure(s) Performed: DILATATION AND CURETTAGE /HYSTEROSCOPY (N/A Vagina )  Patient Location: PACU  Anesthesia Type:General  Level of Consciousness: awake, alert  and oriented  Airway & Oxygen Therapy: Patient Spontanous Breathing and Patient connected to nasal cannula oxygen  Post-op Assessment: Report given to RN and Post -op Vital signs reviewed and stable  Post vital signs: Reviewed and stable  Last Vitals:  Vitals Value Taken Time  BP    Temp 36.6 C 10/31/19 1320  Pulse    Resp    SpO2      Last Pain:  Vitals:   10/31/19 1320  TempSrc: Oral  PainSc:       Patients Stated Pain Goal: 4 ([id number])  Complications: No apparent anesthesia complications

## 2019-10-31 NOTE — Anesthesia Postprocedure Evaluation (Signed)
Anesthesia Post Note  Patient: Sheena [name]  Procedure(s) Performed: DILATATION AND CURETTAGE /HYSTEROSCOPY (N/A Vagina )     Patient location during evaluation: PACU Anesthesia Type: General Level of consciousness: awake and alert Pain management: pain level controlled Vital Signs Assessment: post-procedure vital signs reviewed and stable Respiratory status: spontaneous breathing, nonlabored ventilation, respiratory function stable and patient connected to nasal cannula oxygen Cardiovascular status: blood pressure returned to baseline and stable Postop Assessment: no apparent nausea or vomiting Anesthetic complications: no    Last Vitals:  Vitals:   10/31/19 1350 10/31/19 1402  BP: 122/84 131/87  Pulse: 79   Resp: 18   Temp:    SpO2: 100%     Last Pain:  Vitals:   10/31/19 1320  TempSrc: Oral  PainSc:                  Barnet Glasgow

## 2019-11-01 LAB — SURGICAL PATHOLOGY

## 2019-11-11 ENCOUNTER — Ambulatory Visit (INDEPENDENT_AMBULATORY_CARE_PROVIDER_SITE_OTHER): Payer: Commercial Managed Care - PPO | Admitting: Family Medicine

## 2019-11-11 ENCOUNTER — Encounter (INDEPENDENT_AMBULATORY_CARE_PROVIDER_SITE_OTHER): Payer: Self-pay | Admitting: Family Medicine

## 2019-11-11 ENCOUNTER — Other Ambulatory Visit: Payer: Self-pay

## 2019-11-11 VITALS — BP 118/79 | HR 92 | Temp 97.8°F | Ht 62.0 in | Wt 233.0 lb

## 2019-11-11 DIAGNOSIS — Z9189 Other specified personal risk factors, not elsewhere classified: Secondary | ICD-10-CM

## 2019-11-11 DIAGNOSIS — F3289 Other specified depressive episodes: Secondary | ICD-10-CM | POA: Diagnosis not present

## 2019-11-11 DIAGNOSIS — R7303 Prediabetes: Secondary | ICD-10-CM

## 2019-11-11 DIAGNOSIS — E559 Vitamin D deficiency, unspecified: Secondary | ICD-10-CM

## 2019-11-11 DIAGNOSIS — Z6841 Body Mass Index (BMI) 40.0 and over, adult: Secondary | ICD-10-CM

## 2019-11-12 ENCOUNTER — Telehealth (INDEPENDENT_AMBULATORY_CARE_PROVIDER_SITE_OTHER): Payer: Commercial Managed Care - PPO | Admitting: Psychology

## 2019-11-12 DIAGNOSIS — F3289 Other specified depressive episodes: Secondary | ICD-10-CM | POA: Diagnosis not present

## 2019-11-12 DIAGNOSIS — F419 Anxiety disorder, unspecified: Secondary | ICD-10-CM

## 2019-11-12 MED ORDER — METFORMIN HCL 500 MG PO TABS: 500.0000 mg | ORAL_TABLET | Freq: Every morning | ORAL | 0 refills | Status: DC

## 2019-11-12 NOTE — Progress Notes (Signed)
Chief Complaint:   OBESITY Sheena [name] is here to discuss her progress with her obesity treatment plan along with follow-up of her obesity related diagnoses. Sheena [name] is on the Category 2 Plan with breakfast options and states she is following her eating plan approximately 70% of the time. Sheena [name] states she is doing physical therapy exercises for 15 minutes 3 times per week.  Today's visit was #: 5 Starting weight: 250 lbs Starting date: 08/29/2019 Today's weight: 250 lbs Today's date: 11/11/2019 Total lbs lost to date: 17 Total lbs lost since last in-office visit: 4  Interim History: Sheena [name] really enjoyed the additional breakfast options that were provided at her previous office visit. She under went D?C with hysteroscopy on 10/31/2019, pathology was normal. She continues to struggle with the death of her husband, however she reports having an excellent support system of friends and family.  Subjective:   1. Pre-diabetes Sheena [name]'s A1c level on 08/29/2019 was 5.9. She is on metformin 500 mg q daily and is tolerating it well. She reports her hunger levels are stable.  2. Vitamin D deficiency Sheena [name]'s Vit D level on 08/29/2019 was 42.2. She is on prescription strength Vit D supplementation.  3. Other depression with emotional eating Sheena [name] continues to struggle with grieving the death of her husband. She has been on Duloxetine 30 mg q daily for years, and she reports stable mood and denies suicidal ideas or homicidal ideas. She reports an excellent support system of friends and family.  4. At risk for activity intolerance Sheena [name] is at risk for exercise intolerance due to chronic pain from recent surgery.  Assessment/Plan:   1. Pre-diabetes Sheena [name] will continue to work on weight loss, exercise, and decreasing simple carbohydrates to help decrease the risk of diabetes. We will refill metformin for 1 month, and we will recheck labs at her next office visit.  - metFORMIN (GLUCOPHAGE) 500 MG tablet; Take 1  tablet (500 mg total) by mouth every morning.  Dispense: 30 tablet; Refill: 0  2. Vitamin D deficiency Low Vitamin D level contributes to fatigue and are associated with obesity, breast, and colon cancer. Sheena [name] agreed to continue taking prescription Vitamin D 50,000 IU every week and will follow-up for routine testing of Vitamin D, at least 2-3 times per year to avoid over-replacement. We will recheck labs every 3 months.  3. Other depression with emotional eating Behavior modification techniques were discussed today to help Sheena [name] deal with her emotional/non-hunger eating behaviors. Brittiany will continue her current SNRI regimen. Orders and follow up as documented in patient record.   4. At risk for activity intolerance Sheena [name] was given approximately 15 minutes of exercise intolerance counseling today. She is 54 y.o. female and has risk factors exercise intolerance including obesity and chronic pain from recent surgery. We discussed intensive lifestyle modifications today with an emphasis on specific weight loss instructions and strategies. Sheena [name] will slowly increase activity as tolerated.  Repetitive spaced learning was employed today to elicit superior memory formation and behavioral change.  5. Class 3 severe obesity with serious comorbidity and body mass index (BMI) of 40.0 to 44.9 in adult, unspecified obesity type (HCC) Sheena [name] is currently in the action stage of change. As such, her goal is to continue with weight loss efforts. She has agreed to the Category 2 Plan with additional breakfast options.   Exercise goals: As is.  Behavioral modification strategies: increasing lean protein intake, decreasing simple carbohydrates, no skipping meals, meal planning and cooking strategies and celebration eating  strategies.  Sheena [name] has agreed to follow-up with our clinic in 3 weeks. She was informed of the importance of frequent follow-up visits to maximize her success with intensive lifestyle modifications for  her multiple health conditions.   Objective:   Blood pressure 118/79, pulse 92, temperature 97.8 F (36.6 C), temperature source Oral, height 5\' 2"  (1.575 m), weight 233 lb (105.7 kg), last menstrual period 01/29/2017, SpO2 97 %. Body mass index is 42.62 kg/m.  General: Cooperative, alert, well developed, in no acute distress. HEENT: Conjunctivae and lids unremarkable. Cardiovascular: Regular rhythm.  Lungs: Normal work of breathing. Neurologic: No focal deficits.   Lab Results  Component Value Date   CREATININE 1.00 08/29/2019   BUN 14 08/29/2019   NA 139 08/29/2019   K 4.5 08/29/2019   CL 104 08/29/2019   CO2 22 08/29/2019   Lab Results  Component Value Date   ALT 25 08/29/2019   AST 25 08/29/2019   ALKPHOS 107 08/29/2019   BILITOT 0.4 08/29/2019   Lab Results  Component Value Date   HGBA1C 5.9 (H) 08/29/2019   Lab Results  Component Value Date   INSULIN 15.0 08/29/2019   Lab Results  Component Value Date   TSH 0.427 (L) 08/29/2019   Lab Results  Component Value Date   CHOL 186 08/29/2019   HDL 42 08/29/2019   LDLCALC 114 (H) 08/29/2019   TRIG 170 (H) 08/29/2019   Lab Results  Component Value Date   WBC 11.4 (H) 08/29/2019   HGB 13.7 08/29/2019   HCT 43.6 08/29/2019   MCV 80 08/29/2019   PLT 342 08/29/2019   No results found for: IRON, TIBC, FERRITIN  Attestation Statements:   Reviewed by clinician on day of visit: allergies, medications, problem list, medical history, surgical history, family history, social history, and previous encounter notes.   I, Trixie Dredge, am acting as transcriptionist for Dennard Nip, MD.  I have reviewed the above documentation for accuracy and completeness, and I agree with the above. -  Dennard Nip, MD

## 2019-11-25 ENCOUNTER — Other Ambulatory Visit: Payer: Self-pay | Admitting: Obstetrics and Gynecology

## 2019-11-25 DIAGNOSIS — R928 Other abnormal and inconclusive findings on diagnostic imaging of breast: Secondary | ICD-10-CM

## 2019-11-26 ENCOUNTER — Other Ambulatory Visit: Payer: Self-pay

## 2019-11-26 ENCOUNTER — Ambulatory Visit
Admission: RE | Admit: 2019-11-26 | Discharge: 2019-11-26 | Disposition: A | Discharge status: Home / Self Care | Payer: Commercial Managed Care - PPO | Source: Ambulatory Visit | Attending: Obstetrics and Gynecology | Admitting: Obstetrics and Gynecology

## 2019-11-26 DIAGNOSIS — R928 Other abnormal and inconclusive findings on diagnostic imaging of breast: Secondary | ICD-10-CM

## 2019-11-27 NOTE — Progress Notes (Signed)
Office: [phone]  /  Fax: [phone]    Date: December 10, 2019   Appointment Start Time: 10:06am Duration: 27 minutes Provider: Glennie Isle, Psy.D. Type of Session: Individual Therapy  Location of Patient: Work Location of Provider: Provider's Home Type of Contact: Telepsychological Visit via Telephone call  Session Content: This provider called Sheena [name] at 10:02am as she did not present for the telepsychological appointment. She indicated she was busy at work, but could join the appointment. This provider called Jim again at 10:06am as she did not present for the appointment. She provided verbal consent to proceed via a regular telephone call due to challenges signing on to [place]. As such, today's appointment was initiated 6 minutes late. Sheena [name] is a 54 y.o. female presenting via a telephone call for a follow-up appointment to address the previously established treatment goal of increasing coping skills. Today's appointment was a telepsychological visit due to COVID-19. Sheena [name] provided verbal consent for today's telepsychological appointment and she is aware she is responsible for securing confidentiality on her end of the session. Prior to proceeding with today's appointment, Sheena [name]'s physical location at the time of this appointment was obtained as well a phone number she could be reached at in the event of technical difficulties. [name] and this provider participated in today's telepsychological service.   This provider conducted a brief check-in. Sheena [name] reported ongoing stressors at work, adding a co-worker's husband passed away. Coping with the aforementioned was explored. Sheena [name] discussed feeling "stretched," adding she is still trying to focus on her responsibilities and eating goals. As such, session focused on self-compassion. She acknowledged it is important to focus on self-care. Session focused on prioritizing tasks and projects she wishes to complete by breaking up the tasks into smaller  steps. She noted, "That's do able." [name] noted desire to initiate longer-term therapeutic services due to ongoing stressors. Sheena [name] provided verbal consent for this provider to place a referral to address ongoing stressors. Sheena [name] was receptive to today's appointment as evidenced by openness to sharing and responsiveness to feedback.  Mental Status Examination:  Appearance: unable to assess  Behavior: unable to assess Mood: stressed Affect: unable to fully assess Speech: normal in rate, volume, and tone Eye Contact: unable to assess Psychomotor Activity: unable to assess  Gait: unable to assess Thought Process: linear, logical, and goal directed  Thought Content/Perception: no hallucinations, delusions, bizarre thinking or behavior reported or observed and no evidence of suicidal and homicidal ideation, plan, and intent Orientation: time, person, place, and purpose of appointment Memory/Concentration: memory, attention, language, and fund of knowledge intact  Insight/Judgment: good  Interventions:  Conducted a brief chart review Provided empathic reflections and validation Employed supportive psychotherapy interventions to facilitate reduced distress and to improve coping skills with identified stressors Employed motivational interviewing skills to assess patient's willingness/desire to adhere to recommended medical treatments and assignments Engaged patient in problem solving  Discussed longer-term therapeutic services   DSM-5 Diagnosis(es): 311 (F32.8) Other Specified Depressive Disorder, Emotional Eating Behaviors and 300.00 (F41.9) Unspecified Anxiety Disorder  Treatment Goal & Progress: During the initial appointment with this provider, the following treatment goal was established: increase coping skills. Sheena [name] has demonstrated progress in her goal as evidenced by increased awareness of hunger patterns and increased awareness of triggers for emotional eating. Sheena [name] also continues to  demonstrate willingness to engage in learned skill(s).  Plan: Based on recent stressors, the next appointment will be scheduled in two weeks, which will be via [place] Visit. The next session will focus  on working towards the established treatment goal.

## 2019-12-04 ENCOUNTER — Ambulatory Visit (INDEPENDENT_AMBULATORY_CARE_PROVIDER_SITE_OTHER): Payer: Commercial Managed Care - PPO | Admitting: Family Medicine

## 2019-12-10 ENCOUNTER — Other Ambulatory Visit: Payer: Self-pay

## 2019-12-10 ENCOUNTER — Telehealth (INDEPENDENT_AMBULATORY_CARE_PROVIDER_SITE_OTHER): Payer: Commercial Managed Care - PPO | Admitting: Psychology

## 2019-12-10 DIAGNOSIS — F3289 Other specified depressive episodes: Secondary | ICD-10-CM

## 2019-12-10 DIAGNOSIS — F419 Anxiety disorder, unspecified: Secondary | ICD-10-CM | POA: Diagnosis not present

## 2019-12-10 NOTE — Progress Notes (Signed)
  Office: [phone]  /  Fax: [phone]    Date: December 24, 2019   Appointment Start Time: 10:33am Duration: 23 minutes Provider: Glennie Isle, Psy.D. Type of Session: Individual Therapy  Location of Patient: Work Biomedical scientist of Provider: [facility] Type of Contact: Telepsychological Visit via telephone call  Session Content: This provider called [name] at 10:32am as she did not present for the telepsychological appointment. She noted she forgot about today's appointment and requested to proceed via a regular telephone call. As such, today's appointment was initiated 3 minutes late. Sheena [name] is a 54 y.o. female presenting via telephone call for a follow-up appointment to address the previously established treatment goal of increasing coping skills. Today's appointment was a telepsychological visit due to COVID-19. Serrita provided verbal consent for today's telepsychological appointment and she is aware she is responsible for securing confidentiality on her end of the session. Prior to proceeding with today's appointment, [name]'s physical location at the time of this appointment was obtained as well a phone number she could be reached at in the event of technical difficulties. [name] and this provider participated in today's telepsychological service.   This provider conducted a brief check-in. [name] stated she is in "better shape." She stated her appointment with a new provider is on July 20th. [name] further discussed making progress on projects and eating congruent to her meal plan. Positive reinforcement was provided. A plan was developed to help [name] cope with emotional eating in the future using learned skills. She wrote down the following plan: focus on hydration; be prepared with snacks congruent to the meal plan; pause to ask questions when triggered to eat (e.g., Am I really hungry?, Is there something bothering me?, and Will I feel better if I eat?); and engage in discussed coping  strategies after going through the aforementioned questions. [name] was receptive to today's appointment as evidenced by openness to sharing, responsiveness to feedback, and willingness to continue engaging in learned skills.  Mental Status Examination:  Appearance: unable to assess  Behavior: unable to assess Mood: euthymic Affect: unable to fully assess Speech: normal in rate, volume, and tone Eye Contact: unable to assess Psychomotor Activity: unable to assess  Gait: unable to assess Thought Process: linear, logical, and goal directed  Thought Content/Perception: no hallucinations, delusions, bizarre thinking or behavior reported or observed and no evidence of suicidal and homicidal ideation, plan, and intent Orientation: time, person, place, and purpose of appointment Memory/Concentration: memory, attention, language, and fund of knowledge intact  Insight/Judgment: good  Interventions:  Conducted a brief chart review Provided empathic reflections and validation Provided positive reinforcement Employed supportive psychotherapy interventions to facilitate reduced distress and to improve coping skills with identified stressors Reviewed learned skills  DSM-5 Diagnosis(es): 311 (F32.8) Other Specified Depressive Disorder, Emotional Eating Behaviors and 300.00 (F41.9) Unspecified Anxiety Disorder  Treatment Goal & Progress: During the initial appointment with this provider, the following treatment goal was established: increase coping skills. Tanaya demonstrated progress in her goal as evidenced by increased awareness of hunger patterns and increased awareness of triggers for emotional eating. Brailee also continues to demonstrate willingness to engage in learned skill(s).  Plan: As previously planned, today was [name]'s last appointment with this provider. She acknowledged understanding that she may request a follow-up appointment with this provider in the future as long as she is still established  with the clinic. No further follow-up planned by this provider.

## 2019-12-17 ENCOUNTER — Other Ambulatory Visit: Payer: Self-pay

## 2019-12-17 ENCOUNTER — Ambulatory Visit (INDEPENDENT_AMBULATORY_CARE_PROVIDER_SITE_OTHER): Payer: Commercial Managed Care - PPO | Admitting: Adult Health

## 2019-12-17 ENCOUNTER — Encounter (INDEPENDENT_AMBULATORY_CARE_PROVIDER_SITE_OTHER): Payer: Self-pay | Admitting: Adult Health

## 2019-12-17 VITALS — BP 123/84 | HR 90 | Temp 98.1°F | Ht 62.0 in | Wt 229.0 lb

## 2019-12-17 DIAGNOSIS — E559 Vitamin D deficiency, unspecified: Secondary | ICD-10-CM | POA: Diagnosis not present

## 2019-12-17 DIAGNOSIS — Z6841 Body Mass Index (BMI) 40.0 and over, adult: Secondary | ICD-10-CM

## 2019-12-17 DIAGNOSIS — R7303 Prediabetes: Secondary | ICD-10-CM | POA: Diagnosis not present

## 2019-12-17 DIAGNOSIS — Z9189 Other specified personal risk factors, not elsewhere classified: Secondary | ICD-10-CM

## 2019-12-17 DIAGNOSIS — E66813 Obesity, class 3: Secondary | ICD-10-CM

## 2019-12-17 DIAGNOSIS — Z6839 Body mass index (BMI) 39.0-39.9, adult: Secondary | ICD-10-CM | POA: Insufficient documentation

## 2019-12-17 MED ORDER — METFORMIN HCL 500 MG PO TABS: 500.0000 mg | ORAL_TABLET | Freq: Every morning | ORAL | 0 refills | Status: DC

## 2019-12-17 NOTE — Progress Notes (Signed)
Chief Complaint:   OBESITY Sheena [name] is here to discuss her progress with her obesity treatment plan along with follow-up of her obesity related diagnoses. [name] is on the Category 2 Plan and states she is following her eating plan approximately 50% of the time. [name] states she is walking more 7 times per week.  Today's visit was #: 6 Starting weight: 250 lbs Starting date: 08/29/2019 Today's weight: 229 lbs Today's date: 12/17/2019 Total lbs lost to date: 21 Total lbs lost since last in-office visit: 4  Interim History: Sheena [name] feels that the Category 2 meal plan is fairly easy to follow and denies polyphagia. She is still coming to terms with her husband's death and was recently referred to [facility]. She has been slowly cleaning/clearing out their home, to creat a new space free of clutter.  She was able to pay off her mortgage on their 31st anniversary and she knows that her husband would have been very proud of that accomplishment.  Subjective:   Prediabetes. Sheena [name] has a diagnosis of prediabetes based on her elevated HgA1c and was informed this puts her at greater risk of developing diabetes. She continues to work on diet and exercise to decrease her risk of diabetes. She denies nausea or hypoglycemia. Sheena [name] is on metformin 500 mg daily. She denies polyphagia.  Lab Results  Component Value Date   HGBA1C 5.9 (H) 08/29/2019   Lab Results  Component Value Date   INSULIN 15.0 08/29/2019   Vitamin D deficiency. Sheena [name] is on prescription strength Vitamin D supplementation. She is tolerating it well with no side effects.   Ref. Range 08/29/2019 10:36  Vitamin D, 25-Hydroxy Latest Ref Range: 30.0 - 100.0 ng/mL 42.2   At risk for diabetes mellitus. Sheena [name] is at higher than average risk for developing diabetes due to obesity and prediabetes.   Assessment/Plan:   Prediabetes. [name] will continue to work on weight loss, exercise, and decreasing simple carbohydrates to help decrease  the risk of diabetes. Refill was given for metFORMIN (GLUCOPHAGE) 500 MG tablet daily #30 with 0 refills. Comprehensive metabolic panel, CBC with Differential/Platelet, Hemoglobin A1c, Insulin, random, Lipid Panel With LDL/HDL Ratio, Folate, T3, T4, TSH labs ordered today.  Vitamin D deficiency. Low Vitamin D level contributes to fatigue and are associated with obesity, breast, and colon cancer. She agrees to continue to take prescription Vitamin D as directed (no refill today) and VITAMIN D 25 Hydroxy (Vit-D Deficiency, Fractures) level was ordered today.  At risk for diabetes mellitus. Sheena [name] was given approximately 15 minutes of diabetes education and counseling today. We discussed intensive lifestyle modifications today with an emphasis on weight loss as well as increasing exercise and decreasing simple carbohydrates in her diet. We also reviewed medication options with an emphasis on risk versus benefit of those discussed.   Repetitive spaced learning was employed today to elicit superior memory formation and behavioral change.  Class 3 severe obesity with serious comorbidity and body mass index (BMI) of 40.0 to 44.9 in adult, unspecified obesity type ([place]).  Sheena [name] is currently in the action stage of change. As such, her goal is to continue with weight loss efforts. She has agreed to the Category 2 Plan.   Handout was provided on [employer].  Exercise goals: Sheena [name] will continue her current exercise regimen.   Behavioral modification strategies: increasing lean protein intake, decreasing simple carbohydrates, meal planning and cooking strategies and planning for success.  Sheena [name] has agreed to follow-up with our clinic  in 2 weeks. She was informed of the importance of frequent follow-up visits to maximize her success with intensive lifestyle modifications for her multiple health conditions.   Sheena [name] was informed we would discuss her lab results at her next visit unless there is a critical issue  that needs to be addressed sooner. Sheena [name] agreed to keep her next visit at the agreed upon time to discuss these results.  Objective:   Blood pressure 123/84, pulse 90, temperature 98.1 F (36.7 C), temperature source Oral, height 5\' 2"  (1.575 m), weight 229 lb (103.9 kg), last menstrual period 01/29/2017, SpO2 99 %. Body mass index is 41.88 kg/m.  General: Cooperative, alert, well developed, in no acute distress. HEENT: Conjunctivae and lids unremarkable. Cardiovascular: Regular rhythm.  Lungs: Normal work of breathing. Neurologic: No focal deficits.   Lab Results  Component Value Date   CREATININE 1.00 08/29/2019   BUN 14 08/29/2019   NA 139 08/29/2019   K 4.5 08/29/2019   CL 104 08/29/2019   CO2 22 08/29/2019   Lab Results  Component Value Date   ALT 25 08/29/2019   AST 25 08/29/2019   ALKPHOS 107 08/29/2019   BILITOT 0.4 08/29/2019   Lab Results  Component Value Date   HGBA1C 5.9 (H) 08/29/2019   Lab Results  Component Value Date   INSULIN 15.0 08/29/2019   Lab Results  Component Value Date   TSH 0.427 (L) 08/29/2019   Lab Results  Component Value Date   CHOL 186 08/29/2019   HDL 42 08/29/2019   LDLCALC 114 (H) 08/29/2019   TRIG 170 (H) 08/29/2019   Lab Results  Component Value Date   WBC 11.4 (H) 08/29/2019   HGB 13.7 08/29/2019   HCT 43.6 08/29/2019   MCV 80 08/29/2019   PLT 342 08/29/2019   No results found for: IRON, TIBC, FERRITIN  Attestation Statements:   Reviewed by clinician on day of visit: allergies, medications, problem list, medical history, surgical history, family history, social history, and previous encounter notes.  I, Michaelene Song, am acting as Location manager for [employer], NP-C   I have reviewed the above documentation for accuracy and completeness, and I agree with the above. -  Esaw Grandchild, NP

## 2019-12-18 LAB — LIPID PANEL WITH LDL/HDL RATIO
Cholesterol, Total: 159 mg/dL (ref 100–199)
HDL: 45 mg/dL (ref >39)
LDL Chol Calc (NIH): 95 mg/dL (ref 0–99)
LDL/HDL Ratio: 2.1 ratio (ref 0.0–3.2)
Triglycerides: 103 mg/dL (ref 0–149)
VLDL Cholesterol Cal: 19 mg/dL (ref 5–40)

## 2019-12-18 LAB — COMPREHENSIVE METABOLIC PANEL
ALT: 45 IU/L — ABNORMAL HIGH (ref 0–32)
AST: 40 IU/L (ref 0–40)
Albumin/Globulin Ratio: 1.3 (ref 1.2–2.2)
Albumin: 3.9 g/dL (ref 3.8–4.9)
Alkaline Phosphatase: 79 IU/L (ref 48–121)
BUN/Creatinine Ratio: 19 (ref 9–23)
BUN: 23 mg/dL (ref 6–24)
Bilirubin Total: 0.3 mg/dL (ref 0.0–1.2)
CO2: 25 mmol/L (ref 20–29)
Calcium: 9.5 mg/dL (ref 8.7–10.2)
Chloride: 103 mmol/L (ref 96–106)
Creatinine, Ser: 1.18 mg/dL — ABNORMAL HIGH (ref 0.57–1.00)
GFR calc Af Amer: 62 mL/min/{1.73_m2} (ref >59)
GFR calc non Af Amer: 54 mL/min/{1.73_m2} — ABNORMAL LOW (ref >59)
Globulin, Total: 2.9 g/dL (ref 1.5–4.5)
Glucose: 84 mg/dL (ref 65–99)
Potassium: 4.5 mmol/L (ref 3.5–5.2)
Sodium: 139 mmol/L (ref 134–144)
Total Protein: 6.8 g/dL (ref 6.0–8.5)

## 2019-12-18 LAB — CBC WITH DIFFERENTIAL/PLATELET
Basophils Absolute: 0.1 10*3/uL (ref 0.0–0.2)
Basos: 1 %
EOS (ABSOLUTE): 0.2 10*3/uL (ref 0.0–0.4)
Eos: 2 %
Hematocrit: 42.7 % (ref 34.0–46.6)
Hemoglobin: 13.1 g/dL (ref 11.1–15.9)
Immature Grans (Abs): 0 10*3/uL (ref 0.0–0.1)
Immature Granulocytes: 0 %
Lymphocytes Absolute: 2.5 10*3/uL (ref 0.7–3.1)
Lymphs: 37 %
MCH: 24.6 pg — ABNORMAL LOW (ref 26.6–33.0)
MCHC: 30.7 g/dL — ABNORMAL LOW (ref 31.5–35.7)
MCV: 80 fL (ref 79–97)
Monocytes Absolute: 0.5 10*3/uL (ref 0.1–0.9)
Monocytes: 8 %
Neutrophils Absolute: 3.5 10*3/uL (ref 1.4–7.0)
Neutrophils: 52 %
Platelets: 298 10*3/uL (ref 150–450)
RBC: 5.32 x10E6/uL — ABNORMAL HIGH (ref 3.77–5.28)
RDW: 15.4 % (ref 11.7–15.4)
WBC: 6.9 10*3/uL (ref 3.4–10.8)

## 2019-12-18 LAB — INSULIN, RANDOM: INSULIN: 11.7 u[IU]/mL (ref 2.6–24.9)

## 2019-12-18 LAB — HEMOGLOBIN A1C
Est. average glucose Bld gHb Est-mCnc: 108 mg/dL
Hgb A1c MFr Bld: 5.4 % (ref 4.8–5.6)

## 2019-12-18 LAB — FOLATE: Folate: 2.7 ng/mL — ABNORMAL LOW (ref >3.0)

## 2019-12-18 LAB — T4: T4, Total: 15.4 ug/dL — ABNORMAL HIGH (ref 4.5–12.0)

## 2019-12-18 LAB — VITAMIN D 25 HYDROXY (VIT D DEFICIENCY, FRACTURES): Vit D, 25-Hydroxy: 38.2 ng/mL (ref 30.0–100.0)

## 2019-12-18 LAB — T3: T3, Total: 110 ng/dL (ref 71–180)

## 2019-12-18 LAB — TSH: TSH: 0.054 u[IU]/mL — ABNORMAL LOW (ref 0.450–4.500)

## 2019-12-19 ENCOUNTER — Encounter (INDEPENDENT_AMBULATORY_CARE_PROVIDER_SITE_OTHER): Payer: Self-pay

## 2019-12-24 ENCOUNTER — Other Ambulatory Visit: Payer: Self-pay

## 2019-12-24 ENCOUNTER — Telehealth (INDEPENDENT_AMBULATORY_CARE_PROVIDER_SITE_OTHER): Payer: Commercial Managed Care - PPO | Admitting: Psychology

## 2019-12-24 DIAGNOSIS — F3289 Other specified depressive episodes: Secondary | ICD-10-CM

## 2019-12-24 DIAGNOSIS — F419 Anxiety disorder, unspecified: Secondary | ICD-10-CM

## 2019-12-31 ENCOUNTER — Ambulatory Visit (INDEPENDENT_AMBULATORY_CARE_PROVIDER_SITE_OTHER): Payer: Commercial Managed Care - PPO | Admitting: Adult Health

## 2020-01-06 DIAGNOSIS — K635 Polyp of colon: Secondary | ICD-10-CM | POA: Insufficient documentation

## 2020-01-07 ENCOUNTER — Ambulatory Visit (INDEPENDENT_AMBULATORY_CARE_PROVIDER_SITE_OTHER): Payer: Commercial Managed Care - PPO | Admitting: Psychologist

## 2020-01-07 ENCOUNTER — Ambulatory Visit (INDEPENDENT_AMBULATORY_CARE_PROVIDER_SITE_OTHER): Payer: Commercial Managed Care - PPO | Admitting: Adult Health

## 2020-01-07 ENCOUNTER — Other Ambulatory Visit: Payer: Self-pay

## 2020-01-07 ENCOUNTER — Encounter (INDEPENDENT_AMBULATORY_CARE_PROVIDER_SITE_OTHER): Payer: Self-pay | Admitting: Adult Health

## 2020-01-07 VITALS — BP 136/83 | HR 98 | Temp 97.8°F | Ht 62.0 in | Wt 227.0 lb

## 2020-01-07 DIAGNOSIS — N1831 Chronic kidney disease, stage 3a: Secondary | ICD-10-CM | POA: Diagnosis not present

## 2020-01-07 DIAGNOSIS — F411 Generalized anxiety disorder: Secondary | ICD-10-CM

## 2020-01-07 DIAGNOSIS — E559 Vitamin D deficiency, unspecified: Secondary | ICD-10-CM | POA: Diagnosis not present

## 2020-01-07 DIAGNOSIS — Z862 Personal history of diseases of the blood and blood-forming organs and certain disorders involving the immune mechanism: Secondary | ICD-10-CM

## 2020-01-07 DIAGNOSIS — Z634 Disappearance and death of family member: Secondary | ICD-10-CM

## 2020-01-07 DIAGNOSIS — E039 Hypothyroidism, unspecified: Secondary | ICD-10-CM | POA: Diagnosis not present

## 2020-01-07 DIAGNOSIS — Z6841 Body Mass Index (BMI) 40.0 and over, adult: Secondary | ICD-10-CM

## 2020-01-07 DIAGNOSIS — R7303 Prediabetes: Secondary | ICD-10-CM

## 2020-01-07 DIAGNOSIS — E538 Deficiency of other specified B group vitamins: Secondary | ICD-10-CM

## 2020-01-08 NOTE — Progress Notes (Signed)
Chief Complaint:   OBESITY Sheena VESEY is here to discuss her progress with her obesity treatment plan along with follow-up of her obesity related diagnoses. Sheena [name] is on the Category 2 Plan and states she is following her eating plan approximately 70% of the time. Sheena [name] states she is working around [employer].   Today's visit was #: 7 Starting weight: 250 lbs Starting date: 08/29/2019 Today's weight: 227 lbs Today's date: 01/07/2020 Total lbs lost to date: 23 Total lbs lost since last in-office visit: 2  Interim History: Sheena [name] continues to enjoy the Category 2 meal plan and denies excessive cravings or polyphagia. She reports increased energy since starting the program. She had a visit with her PCP yesterday, Dr. Forde Dandy of [facility], and he was pleased with her weight loss and recent labs. She recently established with a therapist and is looking forward to working with him on a weekly basis.  Subjective:   Prediabetes. Sheena [name] has a diagnosis of prediabetes based on her elevated HgA1c and was informed this puts her at greater risk of developing diabetes. She continues to work on diet and exercise to decrease her risk of diabetes. She denies nausea or hypoglycemia. A1c, blood glucose, and insulin levels are all improved from last check. Sheena [name] is on metformin 500 mg QAM and denies GI upset.  Lab Results  Component Value Date   HGBA1C 5.4 12/17/2019   Lab Results  Component Value Date   INSULIN 11.7 12/17/2019   INSULIN 15.0 08/29/2019   Hypothyroidism, unspecified type. TSH is low. Sheena [name] is currently on levothyroxine 175 mcg and has been on this dose for years. She saw her PCP yesterday, Dr. Reynold Bowen of [facility], and he did not adjust her levothyroxine dose. She denies palpitations or sweating.   Lab Results  Component Value Date   TSH 0.054 (L) 12/17/2019   Vitamin D deficiency. Sheena [name] is on Ergocalciferol and denies nausea, vomiting, or muscle weakness.    Ref.  Range 12/17/2019 14:37  Vitamin D, 25-Hydroxy Latest Ref Range: 30.0 - 100.0 ng/mL 38.2   Chronic kidney disease, stage 3a. Creatinine was elevated at 1.18 on 12/17/2019 with a decreased GFR of 54.  History of iron deficiency anemia. CBC was reviewed with the patient. Sheena [name] has known history of iron deficiency anemia and reports being on Ferrous Sulfate years ago, which was poorly tolerated due to GI upset.  CBC Latest Ref Rng & Units 12/17/2019 08/29/2019 12/18/2015  WBC 3.4 - 10.8 x10E3/uL 6.9 11.4(H) 10.3  Hemoglobin 11.1 - 15.9 g/dL 13.1 13.7 12.2  Hematocrit 34.0 - 46.6 % 42.7 43.6 38.4  Platelets 150 - 450 x10E3/uL 298 342 393   No results found for: IRON, TIBC, FERRITIN Lab Results  Component Value Date   VITAMINB12 258 08/29/2019   Folate deficiency. Sheena [name] has a known history of folate deficiency and is not on any supplements presently.  Assessment/Plan:   Prediabetes. Sheena [name] will continue to work on weight loss, exercise, and decreasing simple carbohydrates to help decrease the risk of diabetes. She will continue to follow the Category 2 meal plan. Labs will be checked every 3 months.  Hypothyroidism, unspecified type. Patient with long-standing hypothyroidism, on levothyroxine therapy. She appears euthyroid. Orders and follow up as documented in patient record. If Sheena [name] notices any hyperthyroid symptoms, we recommend follow-up with her PCP.  Counseling  Good thyroid control is important for overall health. Sheena [name] thyroid levels are dangerous and will not improve weight loss results.  The correct way to take levothyroxine is fasting, with water, separated by at least 30 minutes from breakfast, and separated by more than 4 hours from calcium, iron, multivitamins, acid reflux medications (PPIs).   Vitamin D deficiency. Low Vitamin D level contributes to fatigue and are associated with obesity, breast, and colon cancer. She agrees to continue to take Ergocalciferol as directed  (PCP is providing prescription) and will follow-up for routine testing of Vitamin D, at least 2-3 times per year to avoid over-replacement.  Chronic kidney disease, stage 3a. Will monitor labs closely.  History of iron deficiency anemia. Orders and follow up as documented in patient record. We recommend OTC Ferrous Sulfate to be taken with food.  Counseling  Iron is essential for our bodies to make red blood cells.  Reasons that someone may be deficient include: an iron-deficient diet (more likely in those following vegan or vegetarian diets), women with heavy menses, patients with GI disorders or poor absorption, patients that have had bariatric surgery, frequent blood donors, patients with cancer, and patients with heart disease.    An iron supplement has been recommended. This is found over-the-counter.   Iron-rich foods include dark leafy greens, red and white meats, eggs, seafood, and beans.    Certain foods and drinks prevent your body from absorbing iron properly. Avoid eating these foods in the same meal as iron-rich foods or with iron supplements. These foods include: coffee, black tea, and red wine; milk, dairy products, and foods that are high in calcium; beans and soybeans; whole grains.   Constipation can be a side effect of iron supplementation. Increased water and fiber intake are helpful. Water goal: > 2 liters/day. Fiber goal: > 25 grams/day.  Folate deficiency. Sheena [name] will start an OTC multivitamin.  Class 3 severe obesity with serious comorbidity and body mass index (BMI) of 40.0 to 44.9 in adult, unspecified obesity type ([place]).  Sheena [name] is currently in the action stage of change. As such, her goal is to continue with weight loss efforts. She has agreed to the Category 2 Plan.   Exercise goals: Sheena [name] will continue her current exercise regimen.   Behavioral modification strategies: increasing lean protein intake, increasing water intake, meal planning and cooking strategies and  planning for success.  Sheena [name] has agreed to follow-up with our clinic in 3 weeks. She was informed of the importance of frequent follow-up visits to maximize her success with intensive lifestyle modifications for her multiple health conditions.   Objective:   Blood pressure 136/83, pulse 98, temperature 97.8 F (36.6 C), temperature source Oral, height 5\' 2"  (1.575 m), weight 227 lb (103 kg), last menstrual period 01/29/2017, SpO2 98 %. Body mass index is 41.52 kg/m.  General: Cooperative, alert, well developed, in no acute distress. HEENT: Conjunctivae and lids unremarkable. Cardiovascular: Regular rhythm.  Lungs: Normal work of breathing. Neurologic: No focal deficits.   Lab Results  Component Value Date   CREATININE 1.18 (H) 12/17/2019   BUN 23 12/17/2019   NA 139 12/17/2019   K 4.5 12/17/2019   CL 103 12/17/2019   CO2 25 12/17/2019   Lab Results  Component Value Date   ALT 45 (H) 12/17/2019   AST 40 12/17/2019   ALKPHOS 79 12/17/2019   BILITOT 0.3 12/17/2019   Lab Results  Component Value Date   HGBA1C 5.4 12/17/2019   HGBA1C 5.9 (H) 08/29/2019   Lab Results  Component Value Date   INSULIN 11.7 12/17/2019   INSULIN 15.0 08/29/2019   Lab Results  Component Value Date   TSH 0.054 (L) 12/17/2019   Lab Results  Component Value Date   CHOL 159 12/17/2019   HDL 45 12/17/2019   LDLCALC 95 12/17/2019   TRIG 103 12/17/2019   Lab Results  Component Value Date   WBC 6.9 12/17/2019   HGB 13.1 12/17/2019   HCT 42.7 12/17/2019   MCV 80 12/17/2019   PLT 298 12/17/2019   No results found for: IRON, TIBC, FERRITIN  Attestation Statements:   Reviewed by clinician on day of visit: allergies, medications, problem list, medical history, surgical history, family history, social history, and previous encounter notes.  Time spent on visit including pre-visit chart review and post-visit charting and care was 40 minutes.   I, Michaelene Song, am acting as Location manager  for [employer], NP-C   I have reviewed the above documentation for accuracy and completeness, and I agree with the above. -  Esaw Grandchild, NP

## 2020-01-16 ENCOUNTER — Ambulatory Visit (INDEPENDENT_AMBULATORY_CARE_PROVIDER_SITE_OTHER): Payer: Commercial Managed Care - PPO | Admitting: Psychologist

## 2020-01-16 DIAGNOSIS — Z634 Disappearance and death of family member: Secondary | ICD-10-CM | POA: Diagnosis not present

## 2020-01-16 DIAGNOSIS — F411 Generalized anxiety disorder: Secondary | ICD-10-CM

## 2020-01-27 ENCOUNTER — Encounter (INDEPENDENT_AMBULATORY_CARE_PROVIDER_SITE_OTHER): Payer: Self-pay | Admitting: Adult Health

## 2020-01-27 ENCOUNTER — Other Ambulatory Visit: Payer: Self-pay

## 2020-01-27 ENCOUNTER — Ambulatory Visit (INDEPENDENT_AMBULATORY_CARE_PROVIDER_SITE_OTHER): Payer: Commercial Managed Care - PPO | Admitting: Adult Health

## 2020-01-27 VITALS — BP 128/81 | HR 94 | Temp 97.3°F | Ht 62.0 in | Wt 224.0 lb

## 2020-01-27 DIAGNOSIS — Z6841 Body Mass Index (BMI) 40.0 and over, adult: Secondary | ICD-10-CM

## 2020-01-27 DIAGNOSIS — R7303 Prediabetes: Secondary | ICD-10-CM

## 2020-01-27 DIAGNOSIS — E559 Vitamin D deficiency, unspecified: Secondary | ICD-10-CM

## 2020-01-27 NOTE — Progress Notes (Signed)
Chief Complaint:   OBESITY MASHAWN [name] is here to discuss her progress with her obesity treatment plan along with follow-up of her obesity related diagnoses. Sheena [name] is on the Category 2 Plan and states she is following her eating plan approximately 50% of the time. Sheena [name] states she is exercising 0 minutes 0 times per week.  Today's visit was #: 8 Starting weight: 250 lbs Starting date: 08/29/2019 Today's weight: 224 lbs Today's date: 01/28/2020 Total lbs lost to date: 26 Total lbs lost since last in-office visit: 3  Interim History: Sheena [name] states that since her husband passed there hasn't been any junk food in the house- which has aided her in staying on track. She has been working greater than 10 hours a day, will then visit her mother her nursing home, and will then work on her back deck until sunset. She has struggled to consume all of the protein at dinner often due to exhaustion.  Subjective:   Prediabetes. Sheena [name] has a diagnosis of prediabetes based on her elevated HgA1c and was informed this puts her at greater risk of developing diabetes. She continues to work on diet and exercise to decrease her risk of diabetes. She denies nausea or hypoglycemia. Elna is on metformin 500 mg QAM and denies polyphagia or GI upset.  Lab Results  Component Value Date   HGBA1C 5.4 12/17/2019   Lab Results  Component Value Date   INSULIN 11.7 12/17/2019   INSULIN 15.0 08/29/2019   Vitamin D deficiency. Oluwadamilola is on prescription strength Vitamin D supplementation and denies nausea, vomiting, or muscle weakness.    Ref. Range 12/17/2019 14:37  Vitamin D, 25-Hydroxy Latest Ref Range: 30.0 - 100.0 ng/mL 38.2   Assessment/Plan:   Prediabetes. Sheena [name] will continue to work on weight loss, exercise, and decreasing simple carbohydrates to help decrease the risk of diabetes. She will continue metformin as directed (no medication refill today) and will continue to follow the Category 2 meal plan.  Vitamin D  deficiency. Low Vitamin D level contributes to fatigue and are associated with obesity, breast, and colon cancer. She agrees to continue to take prescription Ergocalciferol as directed (no medication refill today) and will follow-up for routine testing of Vitamin D, at least 2-3 times per year to avoid over-replacement.  Class 3 severe obesity with serious comorbidity and body mass index (BMI) of 40.0 to 44.9 in adult, unspecified obesity type (Sheena [name]).  [name] is currently in the action stage of change. As such, her goal is to continue with weight loss efforts. She has agreed to the Category 2 Plan. We recommend changing lunch and dinner timing to help with consuming all of her daily protein goals.  Handout was provided on Protein Equivalent snacks.  Exercise goals: No exercise has been prescribed at this time.  Behavioral modification strategies: increasing lean protein intake, no skipping meals, meal planning and cooking strategies and planning for success.  Sheena [name] has agreed to follow-up with our clinic in 3 weeks. She was informed of the importance of frequent follow-up visits to maximize her success with intensive lifestyle modifications for her multiple health conditions.   Objective:   Pulse 94, temperature (!) 97.3 F (36.3 C), temperature source Oral, height 5\' 2"  (1.575 m), weight 224 lb (101.6 kg), last menstrual period 01/29/2017, SpO2 99 %. Body mass index is 40.97 kg/m.  General: Cooperative, alert, well developed, in no acute distress. HEENT: Conjunctivae and lids unremarkable. Cardiovascular: Regular rhythm.  Lungs: Normal work of breathing. Neurologic:  No focal deficits.   Lab Results  Component Value Date   CREATININE 1.18 (H) 12/17/2019   BUN 23 12/17/2019   NA 139 12/17/2019   K 4.5 12/17/2019   CL 103 12/17/2019   CO2 25 12/17/2019   Lab Results  Component Value Date   ALT 45 (H) 12/17/2019   AST 40 12/17/2019   ALKPHOS 79 12/17/2019   BILITOT 0.3 12/17/2019    Lab Results  Component Value Date   HGBA1C 5.4 12/17/2019   HGBA1C 5.9 (H) 08/29/2019   Lab Results  Component Value Date   INSULIN 11.7 12/17/2019   INSULIN 15.0 08/29/2019   Lab Results  Component Value Date   TSH 0.054 (L) 12/17/2019   Lab Results  Component Value Date   CHOL 159 12/17/2019   HDL 45 12/17/2019   LDLCALC 95 12/17/2019   TRIG 103 12/17/2019   Lab Results  Component Value Date   WBC 6.9 12/17/2019   HGB 13.1 12/17/2019   HCT 42.7 12/17/2019   MCV 80 12/17/2019   PLT 298 12/17/2019   No results found for: IRON, TIBC, FERRITIN  Attestation Statements:   Reviewed by clinician on day of visit: allergies, medications, problem list, medical history, surgical history, family history, social history, and previous encounter notes.  Time spent on visit including pre-visit chart review and post-visit charting and care was 29 minutes.   I, Michaelene Song, am acting as Location manager for [employer], NP-C   I have reviewed the above documentation for accuracy and completeness, and I agree with the above. -  Esaw Grandchild, NP

## 2020-01-28 ENCOUNTER — Ambulatory Visit (INDEPENDENT_AMBULATORY_CARE_PROVIDER_SITE_OTHER): Payer: Commercial Managed Care - PPO | Admitting: Psychologist

## 2020-01-28 DIAGNOSIS — F411 Generalized anxiety disorder: Secondary | ICD-10-CM | POA: Diagnosis not present

## 2020-01-28 DIAGNOSIS — Z634 Disappearance and death of family member: Secondary | ICD-10-CM

## 2020-02-19 ENCOUNTER — Ambulatory Visit (INDEPENDENT_AMBULATORY_CARE_PROVIDER_SITE_OTHER): Payer: Commercial Managed Care - PPO | Admitting: Adult Health

## 2020-02-19 ENCOUNTER — Encounter (INDEPENDENT_AMBULATORY_CARE_PROVIDER_SITE_OTHER): Payer: Self-pay | Admitting: Adult Health

## 2020-02-19 ENCOUNTER — Other Ambulatory Visit: Payer: Self-pay

## 2020-02-19 VITALS — BP 126/85 | HR 90 | Temp 97.7°F | Ht 62.0 in | Wt 221.0 lb

## 2020-02-19 DIAGNOSIS — Z9189 Other specified personal risk factors, not elsewhere classified: Secondary | ICD-10-CM

## 2020-02-19 DIAGNOSIS — R7303 Prediabetes: Secondary | ICD-10-CM

## 2020-02-19 DIAGNOSIS — Z6841 Body Mass Index (BMI) 40.0 and over, adult: Secondary | ICD-10-CM

## 2020-02-19 DIAGNOSIS — E559 Vitamin D deficiency, unspecified: Secondary | ICD-10-CM | POA: Diagnosis not present

## 2020-02-19 MED ORDER — VITAMIN D (ERGOCALCIFEROL) 1.25 MG (50000 UNIT) PO CAPS: 50000.0000 [IU] | ORAL_CAPSULE | ORAL | 0 refills | Status: DC

## 2020-02-19 MED ORDER — METFORMIN HCL 500 MG PO TABS: 500.0000 mg | ORAL_TABLET | Freq: Every morning | ORAL | 0 refills | Status: DC

## 2020-02-19 NOTE — Progress Notes (Signed)
Chief Complaint:   OBESITY Sheena [name] is here to discuss her progress with her obesity treatment plan along with follow-up of her obesity related diagnoses. Sheena [name] is on the Category 2 Plan and states she is following her eating plan approximately 70% of the time. Sheena [name] states she is working around the house for exercise.   Today's visit was #: 9 Starting weight: 250 lbs Starting date: 08/29/2019 Today's weight: 221 lbs Today's date: 02/19/2020 Total lbs lost to date: 29 Total lbs lost since last in-office visit: 3  Interim History: If Sheena [name] is on her regular schedule she can easily follow the Category 2 meal plan. If she is busy or "distracted" she can "forget" to eat, however, she denies poor appetite and is able to eat all foods on the plan on "normal days." She is hoping to have a left total knee replacement in early 2022 and needs minimum BMI <40. She hopes to lose down to a BMI around 30.  Subjective:   Prediabetes. Sheena [name] has a diagnosis of prediabetes based on her elevated HgA1c and was informed this puts her at greater risk of developing diabetes. She continues to work on diet and exercise to decrease her risk of diabetes. She denies nausea or hypoglycemia. A1c and insulin levels were both improved at last check. CMP showed a GFR of 54. Sheena [name] denies poor appetite.  Lab Results  Component Value Date   HGBA1C 5.4 12/17/2019   Lab Results  Component Value Date   INSULIN 11.7 12/17/2019   INSULIN 15.0 08/29/2019   Vitamin D deficiency. Sheena [name] is on Ergocalciferol. No nausea, vomiting, or muscle weakness.    Ref. Range 12/17/2019 14:37  Vitamin D, 25-Hydroxy Latest Ref Range: 30.0 - 100.0 ng/mL 38.2   At risk for osteoporosis. Sheena [name] is at higher risk of osteopenia and osteoporosis due to Vitamin D deficiency and obesity.   Assessment/Plan:   Prediabetes. Sheena [name] will continue to work on weight loss, exercise, and decreasing simple carbohydrates to help decrease the risk of diabetes.  Refill was given for metFORMIN (GLUCOPHAGE) 500 MG tablet daily #30 with 0 refills. Labs will be checked at the end of September and if levels are normal will discuss reducing or stopping metformin.  Vitamin D deficiency. Low Vitamin D level contributes to fatigue and are associated with obesity, breast, and colon cancer. She was given a refill on her Vitamin D, Ergocalciferol, (DRISDOL) 1.25 MG (50000 UNIT) CAPS capsule every week #4 with 0 refills and will follow-up for routine testing of Vitamin D the end of September.   At risk for osteoporosis. Sheena [name] was given approximately 15 minutes of osteoporosis prevention counseling today. Sheena [name] is at risk for osteopenia and osteoporosis due to her Vitamin D deficiency. She was encouraged to take her Vitamin D and follow her higher calcium diet and increase strengthening exercise to help strengthen her bones and decrease her risk of osteopenia and osteoporosis.  Repetitive spaced learning was employed today to elicit superior memory formation and behavioral change.  Class 3 severe obesity with serious comorbidity and body mass index (BMI) of 40.0 to 44.9 in adult, unspecified obesity type ([place]).  Sheena [name] is currently in the action stage of change. As such, her goal is to continue with weight loss efforts. She has agreed to the Category 2 Plan and will journal 300-400 calories and 30+ grams of protein at lunch.   Exercise goals: Sheena [name] will continue her current exercise regimen of high intensity housework.   Behavioral  modification strategies: increasing lean protein intake, meal planning and cooking strategies, celebration eating strategies and planning for success.  Sheena [name] has agreed to follow-up with our clinic in 2-3 weeks. She was informed of the importance of frequent follow-up visits to maximize her success with intensive lifestyle modifications for her multiple health conditions.   Objective:   Blood pressure 126/85, pulse 90, temperature 97.7 F (36.5  C), temperature source Oral, height 5\' 2"  (1.575 m), weight 221 lb (100.2 kg), last menstrual period 01/29/2017, SpO2 99 %. Body mass index is 40.42 kg/m.  General: Cooperative, alert, well developed, in no acute distress. HEENT: Conjunctivae and lids unremarkable. Cardiovascular: Regular rhythm.  Lungs: Normal work of breathing. Neurologic: No focal deficits.   Lab Results  Component Value Date   CREATININE 1.18 (H) 12/17/2019   BUN 23 12/17/2019   NA 139 12/17/2019   K 4.5 12/17/2019   CL 103 12/17/2019   CO2 25 12/17/2019   Lab Results  Component Value Date   ALT 45 (H) 12/17/2019   AST 40 12/17/2019   ALKPHOS 79 12/17/2019   BILITOT 0.3 12/17/2019   Lab Results  Component Value Date   HGBA1C 5.4 12/17/2019   HGBA1C 5.9 (H) 08/29/2019   Lab Results  Component Value Date   INSULIN 11.7 12/17/2019   INSULIN 15.0 08/29/2019   Lab Results  Component Value Date   TSH 0.054 (L) 12/17/2019   Lab Results  Component Value Date   CHOL 159 12/17/2019   HDL 45 12/17/2019   LDLCALC 95 12/17/2019   TRIG 103 12/17/2019   Lab Results  Component Value Date   WBC 6.9 12/17/2019   HGB 13.1 12/17/2019   HCT 42.7 12/17/2019   MCV 80 12/17/2019   PLT 298 12/17/2019   No results found for: IRON, TIBC, FERRITIN  Attestation Statements:   Reviewed by clinician on day of visit: allergies, medications, problem list, medical history, surgical history, family history, social history, and previous encounter notes.  I, Michaelene Song, am acting as Location manager for [employer], NP-C   I have reviewed the above documentation for accuracy and completeness, and I agree with the above. -  Esaw Grandchild, NP

## 2020-02-20 ENCOUNTER — Ambulatory Visit (INDEPENDENT_AMBULATORY_CARE_PROVIDER_SITE_OTHER): Payer: Commercial Managed Care - PPO | Admitting: Psychologist

## 2020-02-20 DIAGNOSIS — F411 Generalized anxiety disorder: Secondary | ICD-10-CM

## 2020-02-20 DIAGNOSIS — Z634 Disappearance and death of family member: Secondary | ICD-10-CM

## 2020-03-12 ENCOUNTER — Other Ambulatory Visit: Payer: Self-pay

## 2020-03-12 ENCOUNTER — Encounter (INDEPENDENT_AMBULATORY_CARE_PROVIDER_SITE_OTHER): Payer: Self-pay | Admitting: Adult Health

## 2020-03-12 ENCOUNTER — Ambulatory Visit (INDEPENDENT_AMBULATORY_CARE_PROVIDER_SITE_OTHER): Payer: Commercial Managed Care - PPO | Admitting: Adult Health

## 2020-03-12 VITALS — BP 125/76 | HR 93 | Temp 97.5°F | Ht 62.0 in | Wt 219.0 lb

## 2020-03-12 DIAGNOSIS — R7303 Prediabetes: Secondary | ICD-10-CM | POA: Diagnosis not present

## 2020-03-12 DIAGNOSIS — Z6841 Body Mass Index (BMI) 40.0 and over, adult: Secondary | ICD-10-CM | POA: Diagnosis not present

## 2020-03-12 DIAGNOSIS — E559 Vitamin D deficiency, unspecified: Secondary | ICD-10-CM | POA: Diagnosis not present

## 2020-03-16 NOTE — Progress Notes (Signed)
Chief Complaint:   OBESITY Sheena [name] is here to discuss her progress with her obesity treatment plan along with follow-up of her obesity related diagnoses. Sheena [name] is on the Category 2 Plan and states she is following her eating plan approximately 50% of the time. Sheena [name] states she is exercising for 0 minutes 0 times per week.  Today's visit was #: 10 Starting weight: 250 lbs Starting date: 08/29/2019 Today's weight: 219 lbs Today's date: 03/12/2020 Total lbs lost to date: 31 lbs Total lbs lost since last in-office visit: 2 lbs  Interim History:  Sheena [name] recently entertained her aunt, uncle, and a family friend.  They stayed with her for 1 week.  She celebrated her son's 30th birthday.  She attended a friend's bridal shower.  She is happily surprised to have lost 2 pounds over the last 2 weeks, even with these events.  Subjective:   1. Vitamin D deficiency Sheena [name]'s Vitamin D level was 38.2 on 12/17/2019. She is currently taking prescription vitamin D 50,000 IU each week. She denies nausea, vomiting or muscle weakness.  Vitamin D is still below goal.    2. Prediabetes Sheena [name] has a diagnosis of prediabetes based on her elevated HgA1c and was informed this puts her at greater risk of developing diabetes. She continues to work on diet and exercise to decrease her risk of diabetes. She denies nausea or hypoglycemia.  She is on metformin 500 mg with breakfast.  Lab Results  Component Value Date   HGBA1C 5.4 12/17/2019   Lab Results  Component Value Date   INSULIN 11.7 12/17/2019   INSULIN 15.0 08/29/2019   Assessment/Plan:   1. Vitamin D deficiency Low Vitamin D level contributes to fatigue and are associated with obesity, breast, and colon cancer. She agrees to continue to take prescription Vitamin D @50 ,000 IU every week and will follow-up for routine testing of Vitamin D, at least 2-3 times per year to avoid over-replacement.  Check labs at next office visit.  2. Prediabetes Sheena [name] will continue  to work on weight loss, exercise, and decreasing simple carbohydrates to help decrease the risk of diabetes.  Check labs at next office visit.  3. Class 3 severe obesity with serious comorbidity and body mass index (BMI) of 40.0 to 44.9 in adult, unspecified obesity type ([facility]) Sheena [name] is currently in the action stage of change. As such, her goal is to continue with weight loss efforts. She has agreed to the Category 3 Plan.   Exercise goals: No exercise has been prescribed at this time.  Behavioral modification strategies: increasing lean protein intake, meal planning and cooking strategies and planning for success.  Sheena [name] has agreed to follow-up with our clinic in 2 weeks, fasting. She was informed of the importance of frequent follow-up visits to maximize her success with intensive lifestyle modifications for her multiple health conditions.   Objective:   Blood pressure 125/76, pulse 93, temperature (!) 97.5 F (36.4 C), height 5\' 2"  (1.575 m), weight 219 lb (99.3 kg), last menstrual period 01/29/2017, SpO2 100 %. Body mass index is 40.06 kg/m.  General: Cooperative, alert, well developed, in no acute distress. HEENT: Conjunctivae and lids unremarkable. Cardiovascular: Regular rhythm.  Lungs: Normal work of breathing. Neurologic: No focal deficits.   Lab Results  Component Value Date   CREATININE 1.18 (H) 12/17/2019   BUN 23 12/17/2019   NA 139 12/17/2019   K 4.5 12/17/2019   CL 103 12/17/2019   CO2 25 12/17/2019   Lab Results  Component Value Date   ALT 45 (H) 12/17/2019   AST 40 12/17/2019   ALKPHOS 79 12/17/2019   BILITOT 0.3 12/17/2019   Lab Results  Component Value Date   HGBA1C 5.4 12/17/2019   HGBA1C 5.9 (H) 08/29/2019   Lab Results  Component Value Date   INSULIN 11.7 12/17/2019   INSULIN 15.0 08/29/2019   Lab Results  Component Value Date   TSH 0.054 (L) 12/17/2019   Lab Results  Component Value Date   CHOL 159 12/17/2019   HDL 45 12/17/2019   LDLCALC  95 12/17/2019   TRIG 103 12/17/2019   Lab Results  Component Value Date   WBC 6.9 12/17/2019   HGB 13.1 12/17/2019   HCT 42.7 12/17/2019   MCV 80 12/17/2019   PLT 298 12/17/2019   Attestation Statements:   Reviewed by clinician on day of visit: allergies, medications, problem list, medical history, surgical history, family history, social history, and previous encounter notes.  Time spent on visit including pre-visit chart review and post-visit care and charting was 27 minutes.   I, Water quality scientist, CMA, am acting as Location manager for Mina Marble, NP.  I have reviewed the above documentation for accuracy and completeness, and I agree with the above. -  Esaw Grandchild, NP

## 2020-03-26 ENCOUNTER — Ambulatory Visit (INDEPENDENT_AMBULATORY_CARE_PROVIDER_SITE_OTHER): Payer: Commercial Managed Care - PPO | Admitting: Adult Health

## 2020-04-09 ENCOUNTER — Ambulatory Visit (INDEPENDENT_AMBULATORY_CARE_PROVIDER_SITE_OTHER): Payer: Commercial Managed Care - PPO | Admitting: Psychologist

## 2020-04-09 DIAGNOSIS — F411 Generalized anxiety disorder: Secondary | ICD-10-CM

## 2020-04-09 DIAGNOSIS — Z634 Disappearance and death of family member: Secondary | ICD-10-CM | POA: Diagnosis not present

## 2020-04-22 ENCOUNTER — Ambulatory Visit (INDEPENDENT_AMBULATORY_CARE_PROVIDER_SITE_OTHER): Payer: Commercial Managed Care - PPO | Admitting: Adult Health

## 2020-04-22 ENCOUNTER — Other Ambulatory Visit: Payer: Self-pay

## 2020-04-22 ENCOUNTER — Encounter (INDEPENDENT_AMBULATORY_CARE_PROVIDER_SITE_OTHER): Payer: Self-pay | Admitting: Adult Health

## 2020-04-22 VITALS — BP 127/81 | HR 94 | Temp 98.7°F | Ht 62.0 in | Wt 216.0 lb

## 2020-04-22 DIAGNOSIS — Z9189 Other specified personal risk factors, not elsewhere classified: Secondary | ICD-10-CM

## 2020-04-22 DIAGNOSIS — Z6839 Body mass index (BMI) 39.0-39.9, adult: Secondary | ICD-10-CM | POA: Diagnosis not present

## 2020-04-22 DIAGNOSIS — E559 Vitamin D deficiency, unspecified: Secondary | ICD-10-CM

## 2020-04-22 DIAGNOSIS — R7303 Prediabetes: Secondary | ICD-10-CM

## 2020-04-22 MED ORDER — VITAMIN D (ERGOCALCIFEROL) 1.25 MG (50000 UNIT) PO CAPS: 50000.0000 [IU] | ORAL_CAPSULE | ORAL | 0 refills | Status: DC

## 2020-04-22 MED ORDER — METFORMIN HCL 500 MG PO TABS: 500.0000 mg | ORAL_TABLET | Freq: Every morning | ORAL | 0 refills | Status: DC

## 2020-04-23 NOTE — Progress Notes (Signed)
Chief Complaint:   OBESITY Sheena [name] is here to discuss her progress with her obesity treatment plan along with follow-up of her obesity related diagnoses. Sheena [name] is on the Category 2 Plan and states she is following her eating plan approximately 50% of the time. Sheena [name] states she is exercising 0 minutes 0 times per week.  Today's visit was #: 11 Starting weight: 250 lbs Starting date: 08/29/2019 Today's weight: 216 lbs Today's date: 04/22/2020 Total lbs lost to date: 34 Total lbs lost since last in-office visit: 3  Interim History: Peri's mother's CHF is worsening and she has been staying with her mother at the nursing home until late in the evening. When she ate off plan, she focused on choosing high protein foods and avoided simple carbohydrate foods.   She reports increased feelings of isolation and depression over the holiday season since the death of her husband in 08-18-19.  She does not feel that one-on-one therapy was beneficial and is considering participating in a Grief Share program at [facility]- wonderful!  Subjective:   Vitamin D deficiency. Vitamin D level on 12/17/2019 was 38.2, below goal of 50. Sheena [name] is on Ergocalciferol.   Ref. Range 12/17/2019 14:37  Vitamin D, 25-Hydroxy Latest Ref Range: 30.0 - 100.0 ng/mL 38.2   Prediabetes. Sheena [name] has a diagnosis of prediabetes based on her elevated HgA1c and was informed this puts her at greater risk of developing diabetes. She continues to work on diet and exercise to decrease her risk of diabetes. She denies nausea or hypoglycemia. 12/17/2019 blood glucose 84, A1c 5.4 with an insulin level of 11.7. Sheena [name] reports good appetite control. She is on metformin 500 mg QAM and denies GI upset.  Lab Results  Component Value Date   HGBA1C 5.4 12/17/2019   Lab Results  Component Value Date   INSULIN 11.7 12/17/2019   INSULIN 15.0 08/29/2019   At risk for diabetes mellitus. Sheena [name] is at higher than average risk for developing diabetes  due to prediabetes and obesity.   Assessment/Plan:   Vitamin D deficiency. Low Vitamin D level contributes to fatigue and are associated with obesity, breast, and colon cancer. She was given a refill on her Vitamin D, Ergocalciferol, (DRISDOL) 1.25 MG (50000 UNIT) CAPS capsule every week and will follow-up for routine testing of Vitamin D at her next office visit.  Prediabetes. Sheena [name] will continue to work on weight loss, exercise, and decreasing simple carbohydrates to help decrease the risk of diabetes. Refill was given for metFORMIN (GLUCOPHAGE) 500 MG tablet QAM #30 with 0 refills. Labs will be checked at her next office visit.  At risk for diabetes mellitus. Sheena [name] was given approximately 15 minutes of diabetes education and counseling today. We discussed intensive lifestyle modifications today with an emphasis on weight loss as well as increasing exercise and decreasing simple carbohydrates in her diet. We also reviewed medication options with an emphasis on risk versus benefit of those discussed.   Repetitive spaced learning was employed today to elicit superior memory formation and behavioral change.  Class 2 severe obesity with serious comorbidity and body mass index (BMI) of 39.0 to 39.9 in adult, unspecified obesity type ([place]).  Megon is currently in the action stage of change. As such, her goal is to continue with weight loss efforts. She has agreed to the Category 2 Plan.   Labs will be checked at her next office visit.  Handout was provided on High Protein/Low Calorie Foods.  Exercise goals: No  exercise has been prescribed at this time.  Behavioral modification strategies: increasing lean protein intake, meal planning and cooking strategies and planning for success.  Bernis has agreed to follow-up with our clinic fasting in 4 weeks. She was informed of the importance of frequent follow-up visits to maximize her success with intensive lifestyle modifications for her multiple health  conditions.   Objective:   Blood pressure 127/81, pulse 94, temperature 98.7 F (37.1 C), height 5\' 2"  (1.575 m), weight 216 lb (98 kg), last menstrual period 01/29/2017, SpO2 100 %. Body mass index is 39.51 kg/m.  General: Cooperative, alert, well developed, in no acute distress. HEENT: Conjunctivae and lids unremarkable. Cardiovascular: Regular rhythm.  Lungs: Normal work of breathing. Neurologic: No focal deficits.   Lab Results  Component Value Date   CREATININE 1.18 (H) 12/17/2019   BUN 23 12/17/2019   NA 139 12/17/2019   K 4.5 12/17/2019   CL 103 12/17/2019   CO2 25 12/17/2019   Lab Results  Component Value Date   ALT 45 (H) 12/17/2019   AST 40 12/17/2019   ALKPHOS 79 12/17/2019   BILITOT 0.3 12/17/2019   Lab Results  Component Value Date   HGBA1C 5.4 12/17/2019   HGBA1C 5.9 (H) 08/29/2019   Lab Results  Component Value Date   INSULIN 11.7 12/17/2019   INSULIN 15.0 08/29/2019   Lab Results  Component Value Date   TSH 0.054 (L) 12/17/2019   Lab Results  Component Value Date   CHOL 159 12/17/2019   HDL 45 12/17/2019   LDLCALC 95 12/17/2019   TRIG 103 12/17/2019   Lab Results  Component Value Date   WBC 6.9 12/17/2019   HGB 13.1 12/17/2019   HCT 42.7 12/17/2019   MCV 80 12/17/2019   PLT 298 12/17/2019   No results found for: IRON, TIBC, FERRITIN  Attestation Statements:   Reviewed by clinician on day of visit: allergies, medications, problem list, medical history, surgical history, family history, social history, and previous encounter notes.  I, Michaelene Song, am acting as Location manager for [employer], NP-C   I have reviewed the above documentation for accuracy and completeness, and I agree with the above. -  Arne Schlender d. Agusta Hackenberg, NP-C

## 2020-05-20 ENCOUNTER — Other Ambulatory Visit: Payer: Self-pay

## 2020-05-20 ENCOUNTER — Ambulatory Visit (INDEPENDENT_AMBULATORY_CARE_PROVIDER_SITE_OTHER): Payer: Commercial Managed Care - PPO | Admitting: Adult Health

## 2020-05-20 ENCOUNTER — Encounter (INDEPENDENT_AMBULATORY_CARE_PROVIDER_SITE_OTHER): Payer: Self-pay | Admitting: Adult Health

## 2020-05-20 VITALS — BP 131/79 | HR 95 | Temp 98.7°F | Ht 62.0 in | Wt 214.0 lb

## 2020-05-20 DIAGNOSIS — R7303 Prediabetes: Secondary | ICD-10-CM | POA: Diagnosis not present

## 2020-05-20 DIAGNOSIS — N1831 Chronic kidney disease, stage 3a: Secondary | ICD-10-CM

## 2020-05-20 DIAGNOSIS — E559 Vitamin D deficiency, unspecified: Secondary | ICD-10-CM

## 2020-05-20 DIAGNOSIS — Z9189 Other specified personal risk factors, not elsewhere classified: Secondary | ICD-10-CM | POA: Diagnosis not present

## 2020-05-20 DIAGNOSIS — Z6839 Body mass index (BMI) 39.0-39.9, adult: Secondary | ICD-10-CM

## 2020-05-20 MED ORDER — VITAMIN D (ERGOCALCIFEROL) 1.25 MG (50000 UNIT) PO CAPS: 50000.0000 [IU] | ORAL_CAPSULE | ORAL | 0 refills | Status: DC

## 2020-05-20 NOTE — Progress Notes (Signed)
Chief Complaint:   OBESITY Sheena [name] is here to discuss her progress with her obesity treatment plan along with follow-up of her obesity related diagnoses. Sheena [name] is on the Category 2 Plan and states she is following her eating plan approximately 70% of the time. Sheena [name] states she is exercising 0 minutes 0 times per week.  Today's visit was #: 12 Starting weight: 250 lbs Starting date: 08/29/2019 Today's weight: 214 lbs Today's date: 05/20/2020 Total lbs lost to date: 36 Total lbs lost since last in-office visit: 2  Interim History: Sheena [name] visited her son the morning of Thanksgiving and then had lunch with a friend. She only had one plate of food and took leftovers to her mother so she did not have leftovers to deal with later in the week.  She is boring a little with the foods on Cat 2 and is interested in some recipe options.   Subjective:   Vitamin D deficiency. Last Vitamin D level was 38.2 on 12/17/2019, below goal of 50. Sheena [name] is on Ergocalciferol. No nausea, vomiting, or muscle weakness.    Ref. Range 12/17/2019 14:37  Vitamin D, 25-Hydroxy Latest Ref Range: 30.0 - 100.0 ng/mL 38.2   Chronic kidney disease, stage 3a ([place]). GFR on 12/17/2019 was 54.  Prediabetes. Sheena [name] has a diagnosis of prediabetes based on her elevated HgA1c and was informed this puts her at greater risk of developing diabetes. She continues to work on diet and exercise to decrease her risk of diabetes. She denies nausea or hypoglycemia. Insulin and A1c levels are improving with A1c 5.4 on 12/17/2019. Sheena [name] is on metformin 500 mg daily, which she is tolerating well.  Lab Results  Component Value Date   HGBA1C 5.4 12/17/2019   Lab Results  Component Value Date   INSULIN 11.7 12/17/2019   INSULIN 15.0 08/29/2019   At risk for osteoporosis. Sheena [name] is at higher risk of osteopenia and osteoporosis due to Vitamin D deficiency and obesity.   Assessment/Plan:   Vitamin D deficiency. Low Vitamin D level contributes  to fatigue and are associated with obesity, breast, and colon cancer. She was given a refill on her Vitamin D, Ergocalciferol, (DRISDOL) 1.25 MG (50000 UNIT) CAPS capsule every week #4 with 0 refills and VITAMIN D 25 Hydroxy (Vit-D Deficiency, Fractures) level will be checked today.   Chronic kidney disease, stage 3a ([place]).  Labs will be checked today.   Prediabetes. Sheena [name] will continue to work on weight loss, exercise, and decreasing simple carbohydrates to help decrease the risk of diabetes. Labs will be checked today. Metformin refill will be deferred until lab results return.  At risk for osteoporosis. Sheena [name] was given approximately 15 minutes of osteoporosis prevention counseling today. Sheena [name] is at risk for osteopenia and osteoporosis due to her Vitamin D deficiency. She was encouraged to take her Vitamin D and follow her higher calcium diet and increase strengthening exercise to help strengthen her bones and decrease her risk of osteopenia and osteoporosis.  Repetitive spaced learning was employed today to elicit superior memory formation and behavioral change.  Class 2 severe obesity with serious comorbidity and body mass index (BMI) of 39.0 to 39.9 in adult, unspecified obesity type ([place]).  Sheena [name] is currently in the action stage of change. As such, her goal is to continue with weight loss efforts. She has agreed to the Category 2 Plan.   Handout was provided on Multiple Recipes.  Exercise goals: Sheena [name] will increase her daily walking.  Behavioral modification strategies:  increasing lean protein intake, meal planning and cooking strategies, keeping healthy foods in the home and planning for success.  Sheena [name] has agreed to follow-up with our clinic in 3 weeks. She was informed of the importance of frequent follow-up visits to maximize her success with intensive lifestyle modifications for her multiple health conditions.   Objective:   Blood pressure 131/79, pulse 95, temperature 98.7 F (37.1  C), height 5\' 2"  (1.575 m), weight 214 lb (97.1 kg), last menstrual period 01/29/2017, SpO2 100 %. Body mass index is 39.14 kg/m.  General: Cooperative, alert, well developed, in no acute distress. HEENT: Conjunctivae and lids unremarkable. Cardiovascular: Regular rhythm.  Lungs: Normal work of breathing. Neurologic: No focal deficits.   Lab Results  Component Value Date   CREATININE 1.18 (H) 12/17/2019   BUN 23 12/17/2019   NA 139 12/17/2019   K 4.5 12/17/2019   CL 103 12/17/2019   CO2 25 12/17/2019   Lab Results  Component Value Date   ALT 45 (H) 12/17/2019   AST 40 12/17/2019   ALKPHOS 79 12/17/2019   BILITOT 0.3 12/17/2019   Lab Results  Component Value Date   HGBA1C 5.4 12/17/2019   HGBA1C 5.9 (H) 08/29/2019   Lab Results  Component Value Date   INSULIN 11.7 12/17/2019   INSULIN 15.0 08/29/2019   Lab Results  Component Value Date   TSH 0.054 (L) 12/17/2019   Lab Results  Component Value Date   CHOL 159 12/17/2019   HDL 45 12/17/2019   LDLCALC 95 12/17/2019   TRIG 103 12/17/2019   Lab Results  Component Value Date   WBC 6.9 12/17/2019   HGB 13.1 12/17/2019   HCT 42.7 12/17/2019   MCV 80 12/17/2019   PLT 298 12/17/2019   No results found for: IRON, TIBC, FERRITIN  Attestation Statements:   Reviewed by clinician on day of visit: allergies, medications, problem list, medical history, surgical history, family history, social history, and previous encounter notes.  I, Michaelene Song, am acting as Location manager for [employer], NP-C   I have reviewed the above documentation for accuracy and completeness, and I agree with the above. -  Jatavis Malek d. Lanissa Cashen, NP-C

## 2020-05-21 ENCOUNTER — Other Ambulatory Visit (INDEPENDENT_AMBULATORY_CARE_PROVIDER_SITE_OTHER): Payer: Self-pay | Admitting: Adult Health

## 2020-05-21 LAB — COMPREHENSIVE METABOLIC PANEL
ALT: 210 IU/L — ABNORMAL HIGH (ref 0–32)
AST: 169 IU/L — ABNORMAL HIGH (ref 0–40)
Albumin/Globulin Ratio: 1.2 (ref 1.2–2.2)
Albumin: 4.1 g/dL (ref 3.8–4.9)
Alkaline Phosphatase: 68 IU/L (ref 44–121)
BUN/Creatinine Ratio: 24 — ABNORMAL HIGH (ref 9–23)
BUN: 22 mg/dL (ref 6–24)
Bilirubin Total: 0.5 mg/dL (ref 0.0–1.2)
CO2: 22 mmol/L (ref 20–29)
Calcium: 9.3 mg/dL (ref 8.7–10.2)
Chloride: 105 mmol/L (ref 96–106)
Creatinine, Ser: 0.92 mg/dL (ref 0.57–1.00)
GFR calc Af Amer: 83 mL/min/{1.73_m2} (ref >59)
GFR calc non Af Amer: 72 mL/min/{1.73_m2} (ref >59)
Globulin, Total: 3.3 g/dL (ref 1.5–4.5)
Glucose: 91 mg/dL (ref 65–99)
Potassium: 4 mmol/L (ref 3.5–5.2)
Sodium: 140 mmol/L (ref 134–144)
Total Protein: 7.4 g/dL (ref 6.0–8.5)

## 2020-05-21 LAB — HEMOGLOBIN A1C
Est. average glucose Bld gHb Est-mCnc: 111 mg/dL
Hgb A1c MFr Bld: 5.5 % (ref 4.8–5.6)

## 2020-05-21 LAB — LIPID PANEL
Chol/HDL Ratio: 3.2 ratio (ref 0.0–4.4)
Cholesterol, Total: 148 mg/dL (ref 100–199)
HDL: 46 mg/dL (ref >39)
LDL Chol Calc (NIH): 82 mg/dL (ref 0–99)
Triglycerides: 113 mg/dL (ref 0–149)
VLDL Cholesterol Cal: 20 mg/dL (ref 5–40)

## 2020-05-21 LAB — INSULIN, RANDOM: INSULIN: 14 u[IU]/mL (ref 2.6–24.9)

## 2020-05-21 LAB — VITAMIN D 25 HYDROXY (VIT D DEFICIENCY, FRACTURES): Vit D, 25-Hydroxy: 49 ng/mL (ref 30.0–100.0)

## 2020-06-10 ENCOUNTER — Ambulatory Visit (INDEPENDENT_AMBULATORY_CARE_PROVIDER_SITE_OTHER): Payer: Commercial Managed Care - PPO | Admitting: Adult Health

## 2020-06-30 ENCOUNTER — Ambulatory Visit (INDEPENDENT_AMBULATORY_CARE_PROVIDER_SITE_OTHER): Payer: Commercial Managed Care - PPO | Admitting: Adult Health

## 2020-06-30 ENCOUNTER — Other Ambulatory Visit: Payer: Self-pay

## 2020-06-30 ENCOUNTER — Encounter (INDEPENDENT_AMBULATORY_CARE_PROVIDER_SITE_OTHER): Payer: Self-pay | Admitting: Adult Health

## 2020-06-30 VITALS — BP 143/79 | HR 91 | Temp 97.8°F | Ht 62.0 in | Wt 213.0 lb

## 2020-06-30 DIAGNOSIS — E7849 Other hyperlipidemia: Secondary | ICD-10-CM

## 2020-06-30 DIAGNOSIS — Z9189 Other specified personal risk factors, not elsewhere classified: Secondary | ICD-10-CM

## 2020-06-30 DIAGNOSIS — E559 Vitamin D deficiency, unspecified: Secondary | ICD-10-CM | POA: Diagnosis not present

## 2020-06-30 DIAGNOSIS — Z6839 Body mass index (BMI) 39.0-39.9, adult: Secondary | ICD-10-CM

## 2020-06-30 DIAGNOSIS — R7401 Elevation of levels of liver transaminase levels: Secondary | ICD-10-CM

## 2020-06-30 DIAGNOSIS — R7303 Prediabetes: Secondary | ICD-10-CM

## 2020-06-30 DIAGNOSIS — E66812 Obesity, class 2: Secondary | ICD-10-CM

## 2020-06-30 MED ORDER — METFORMIN HCL 500 MG PO TABS: 500.0000 mg | ORAL_TABLET | Freq: Every morning | ORAL | 0 refills | Status: DC

## 2020-07-01 NOTE — Progress Notes (Signed)
Chief Complaint:   OBESITY Sheena [name] is here to discuss her progress with her obesity treatment plan along with follow-up of her obesity related diagnoses. Sheena [name] is on the Category 2 Plan and states she is following her eating plan approximately 30% of the time. Sheena [name] states she is exercising 0 minutes 0 times per week.  Today's visit was #: 13 Starting weight: 250 lbs Starting date: 08/29/2019 Today's weight: 213 lbs Today's date: 06/30/2020 Total lbs lost to date: 37 lbs Total lbs lost since last in-office visit: 1 lb  Interim History: An interval goal for Sheena [name] is to lose 1-2 lbs per week. She is bored with Category 2 meal plan and would like to discuss other meal plan options. The Christmas holiday was a challenge since the passing of her husband.  Subjective:   1. Pre-diabetes Discussed labs with patient today. 05/20/2020 blood glucose 91, A1c 5.5, and insulin level 14.0. She will intermittently carb crave.  Lab Results  Component Value Date   HGBA1C 5.5 05/20/2020   Lab Results  Component Value Date   INSULIN 14.0 05/20/2020   INSULIN 11.7 12/17/2019   INSULIN 15.0 08/29/2019    2. Transaminitis Discussed labs with patient today. PCP re-checked CMP and LFT's had normalized on 06/05/2020 with [facility] [facility]. Sheena [name] denies RUQ pain. She denies alcohol or acetaminophen use.   3. Vitamin D deficiency Sheena [name]'s Vitamin D level was 49.0 on 05/20/2020. She is currently taking OTC Women's Centrum. She denies nausea, vomiting or muscle weakness.  Ref. Range 05/20/2020 10:10  Vitamin D, 25-Hydroxy Latest Ref Range: 30.0 - 100.0 ng/mL 49.0   4. Other hyperlipidemia 05/20/2020 Lipid panel level at goal. Sheena [name] is not on a statin.  Lab Results  Component Value Date   ALT 210 (H) 05/20/2020   AST 169 (H) 05/20/2020   ALKPHOS 68 05/20/2020   BILITOT 0.5 05/20/2020   Lab Results  Component Value Date   CHOL 148 05/20/2020   HDL 46 05/20/2020   LDLCALC 82 05/20/2020   TRIG 113  05/20/2020   CHOLHDL 3.2 05/20/2020    5. At risk for impaired function of liver Sheena [name] is at risk for impaired function of liver due to likely diagnosis of fatty liver as evidenced by recent elevated liver enzymes.   Assessment/Plan:   1. Pre-diabetes Avnoor will continue to work on weight loss, exercise, and decreasing simple carbohydrates to help indecrease the risk of diabetes. Refill Metformn for 1 month, as per below.  - metFORMIN (GLUCOPHAGE) 500 MG tablet; Take 1 tablet (500 mg total) by mouth every morning.  Dispense: 30 tablet; Refill: 0  2. Transaminitis We will monitor labs. Continue with healthy eating.  3. Vitamin D deficiency Low Vitamin D level contributes to fatigue and are associated with obesity, breast, and colon cancer. Continue OTC Women's Centrum.  4. Other hyperlipidemia Cardiovascular risk and specific lipid/LDL goals reviewed.  We discussed several lifestyle modifications today and Leniyah will continue to work on diet, exercise and weight loss efforts. Orders and follow up as documented in patient record. Continue to decrease saturated fat.  Counseling Intensive lifestyle modifications are the first line treatment for this issue. . Dietary changes: Increase soluble fiber. Decrease simple carbohydrates. . Exercise changes: Moderate to vigorous-intensity aerobic activity 150 minutes per week if tolerated. . Lipid-lowering medications: see documented in medical record.   5. At risk for impaired function of liver Sheena [name] was given approximately 15 minutes of counseling today regarding prevention of impaired liver function.  Sheena [name] was educated about her risk of developing NASH or even liver failure and advised that the only proven treatment for NAFLD was weight loss of at least 5-10% of body weight.   6. Class 2 severe obesity with serious comorbidity and body mass index (BMI) of 39.0 to 39.9 in adult, unspecified obesity type (HCC) Sheena [name] is currently in the action stage of  change. As such, her goal is to continue with weight loss efforts. She has agreed to keeping a food journal and adhering to recommended goals of 1400-1500 calories and 90 g protein.   Change to daily journaling. Handouts: Multiple recipes and Eating out Guide  Exercise goals: No exercise has been prescribed at this time.  Behavioral modification strategies: increasing lean protein intake, decreasing simple carbohydrates, no skipping meals, meal planning and cooking strategies, planning for success and keeping a strict food journal.  Sheena [name] has agreed to follow-up with our clinic in 3 weeks. She was informed of the importance of frequent follow-up visits to maximize her success with intensive lifestyle modifications for her multiple health conditions.   Objective:   Blood pressure (!) 143/79, pulse 91, temperature 97.8 F (36.6 C), height 5\' 2"  (1.575 m), weight 213 lb (96.6 kg), last menstrual period 01/29/2017, SpO2 99 %. Body mass index is 38.96 kg/m.  General: Cooperative, alert, well developed, in no acute distress. HEENT: Conjunctivae and lids unremarkable. Cardiovascular: Regular rhythm.  Lungs: Normal work of breathing. Neurologic: No focal deficits.   Lab Results  Component Value Date   CREATININE 0.92 05/20/2020   BUN 22 05/20/2020   NA 140 05/20/2020   K 4.0 05/20/2020   CL 105 05/20/2020   CO2 22 05/20/2020   Lab Results  Component Value Date   ALT 210 (H) 05/20/2020   AST 169 (H) 05/20/2020   ALKPHOS 68 05/20/2020   BILITOT 0.5 05/20/2020   Lab Results  Component Value Date   HGBA1C 5.5 05/20/2020   HGBA1C 5.4 12/17/2019   HGBA1C 5.9 (H) 08/29/2019   Lab Results  Component Value Date   INSULIN 14.0 05/20/2020   INSULIN 11.7 12/17/2019   INSULIN 15.0 08/29/2019   Lab Results  Component Value Date   TSH 0.054 (L) 12/17/2019   Lab Results  Component Value Date   CHOL 148 05/20/2020   HDL 46 05/20/2020   LDLCALC 82 05/20/2020   TRIG 113 05/20/2020    CHOLHDL 3.2 05/20/2020   Lab Results  Component Value Date   WBC 6.9 12/17/2019   HGB 13.1 12/17/2019   HCT 42.7 12/17/2019   MCV 80 12/17/2019   PLT 298 12/17/2019   Attestation Statements:   Reviewed by clinician on day of visit: allergies, medications, problem list, medical history, surgical history, family history, social history, and previous encounter notes.  Coral Ceo, am acting as Location manager for Mina Marble, NP.  I have reviewed the above documentation for accuracy and completeness, and I agree with the above. -  Truitt Cruey d. Alvin Rubano, NP-C

## 2020-07-04 DIAGNOSIS — R7401 Elevation of levels of liver transaminase levels: Secondary | ICD-10-CM | POA: Insufficient documentation

## 2020-07-21 ENCOUNTER — Ambulatory Visit (INDEPENDENT_AMBULATORY_CARE_PROVIDER_SITE_OTHER): Payer: Commercial Managed Care - PPO | Admitting: Adult Health

## 2020-07-27 ENCOUNTER — Ambulatory Visit (INDEPENDENT_AMBULATORY_CARE_PROVIDER_SITE_OTHER): Payer: Commercial Managed Care - PPO | Admitting: Adult Health

## 2020-08-27 ENCOUNTER — Ambulatory Visit (INDEPENDENT_AMBULATORY_CARE_PROVIDER_SITE_OTHER): Payer: Commercial Managed Care - PPO | Admitting: Adult Health

## 2020-08-27 ENCOUNTER — Encounter (INDEPENDENT_AMBULATORY_CARE_PROVIDER_SITE_OTHER): Payer: Self-pay | Admitting: Adult Health

## 2020-08-27 ENCOUNTER — Other Ambulatory Visit: Payer: Self-pay

## 2020-08-27 VITALS — BP 142/84 | HR 87 | Temp 97.9°F | Ht 62.0 in | Wt 212.0 lb

## 2020-08-27 DIAGNOSIS — Z6838 Body mass index (BMI) 38.0-38.9, adult: Secondary | ICD-10-CM

## 2020-08-27 DIAGNOSIS — E6609 Other obesity due to excess calories: Secondary | ICD-10-CM | POA: Diagnosis not present

## 2020-08-27 DIAGNOSIS — F17200 Nicotine dependence, unspecified, uncomplicated: Secondary | ICD-10-CM

## 2020-08-27 DIAGNOSIS — Z9189 Other specified personal risk factors, not elsewhere classified: Secondary | ICD-10-CM

## 2020-08-27 DIAGNOSIS — R7303 Prediabetes: Secondary | ICD-10-CM | POA: Diagnosis not present

## 2020-08-27 MED ORDER — METFORMIN HCL 500 MG PO TABS: 500.0000 mg | ORAL_TABLET | Freq: Every morning | ORAL | 0 refills | Status: DC

## 2020-08-31 NOTE — Progress Notes (Signed)
Chief Complaint:   OBESITY Sheena [name] is here to discuss her progress with her obesity treatment plan along with follow-up of her obesity related diagnoses. Sheena [name] is on keeping a food journal and adhering to recommended goals of 1400-1500 calories and 90 g protein and states she is following her eating plan approximately 20% of the time. Sheena [name] states she is doing 0 minutes 0 times per week.  Today's visit was #: 14 Starting weight: 250 lbs Starting date: 08/29/2019 Today's weight: 212 lbs Today's date: 08/27/2020 Total lbs lost to date: 38 lbs Total lbs lost since last in-office visit: 1 lb  Interim History: Sheena [name]'s mother passed away recently- complications of CHF- at age 57. She started [facility] a local [facility] that meets every Monday for 13 week course. She has access to 3 licensed therapists for free counseling via [employer]. The last few weeks of her mother's life, she ate off plan, then after her passing, she was inundated with fried chicken, mashed potatoes, cakes.  Due to recent GI upset (increased GERD symptoms), she has been following BRAT diet.  Subjective:   1. Pre-diabetes 05/20/2020 CMP GFR 72. Sheena [name]'s A1c 5.5 with normal blood glucose and slightly elevated insulin level 14.0. She is on Metformin 500 mg with breakfast and tolerating well.  Lab Results  Component Value Date   HGBA1C 5.5 05/20/2020   Lab Results  Component Value Date   INSULIN 14.0 05/20/2020   INSULIN 11.7 12/17/2019   INSULIN 15.0 08/29/2019    2. Tobacco use disorder [name] estimated to smoke approximately 1/2 pack of cigarettes daily. She declined tobacco cessation today. She is working on a quit date. She has been slowly decreasing the number of cigarettes per day.  3. At risk for heart disease Sheena [name] is at a higher than average risk for cardiovascular disease due to obesity and tobacco use.  Assessment/Plan:   1. Pre-diabetes [name] will continue to work on weight loss, exercise, and decreasing  simple carbohydrates to help decrease the risk of diabetes.   - metFORMIN (GLUCOPHAGE) 500 MG tablet; Take 1 tablet (500 mg total) by mouth every morning.  Dispense: 30 tablet; Refill: 0  2. Tobacco use disorder Try to reduce to stop tobacco use. 1 less cigarette per day.  3. At risk for heart disease Sheena [name] was given approximately 15 minutes of coronary artery disease prevention counseling today. She is 55 y.o. female and has risk factors for heart disease including obesity. We discussed intensive lifestyle modifications today with an emphasis on specific weight loss instructions and strategies.   Repetitive spaced learning was employed today to elicit superior memory formation and behavioral change.  4. Class 2 obesity due to excess calories with body mass index (BMI) of 38.0 to 38.9 in adult, unspecified whether serious comorbidity present Sheena [name] is currently in the action stage of change. As such, her goal is to continue with weight loss efforts. She has agreed to the Category 2 Plan + 100 calories.   Exercise goals: No exercise has been prescribed at this time.  Behavioral modification strategies: increasing lean protein intake, decreasing simple carbohydrates, no skipping meals, meal planning and cooking strategies, better snacking choices and planning for success.  Sheena [name] has agreed to follow-up with our clinic in 3 weeks. She was informed of the importance of frequent follow-up visits to maximize her success with intensive lifestyle modifications for her multiple health conditions.   Objective:   Blood pressure (!) 142/84, pulse 87, temperature 97.9 F (  36.6 C), height 5\' 2"  (1.575 m), weight 212 lb (96.2 kg), last menstrual period 01/29/2017, SpO2 99 %. Body mass index is 38.78 kg/m.  General: Cooperative, alert, well developed, in no acute distress. HEENT: Conjunctivae and lids unremarkable. Cardiovascular: Regular rhythm.  Lungs: Normal work of breathing. Neurologic: No focal  deficits.   Lab Results  Component Value Date   CREATININE 0.92 05/20/2020   BUN 22 05/20/2020   NA 140 05/20/2020   K 4.0 05/20/2020   CL 105 05/20/2020   CO2 22 05/20/2020   Lab Results  Component Value Date   ALT 210 (H) 05/20/2020   AST 169 (H) 05/20/2020   ALKPHOS 68 05/20/2020   BILITOT 0.5 05/20/2020   Lab Results  Component Value Date   HGBA1C 5.5 05/20/2020   HGBA1C 5.4 12/17/2019   HGBA1C 5.9 (H) 08/29/2019   Lab Results  Component Value Date   INSULIN 14.0 05/20/2020   INSULIN 11.7 12/17/2019   INSULIN 15.0 08/29/2019   Lab Results  Component Value Date   TSH 0.054 (L) 12/17/2019   Lab Results  Component Value Date   CHOL 148 05/20/2020   HDL 46 05/20/2020   LDLCALC 82 05/20/2020   TRIG 113 05/20/2020   CHOLHDL 3.2 05/20/2020   Lab Results  Component Value Date   WBC 6.9 12/17/2019   HGB 13.1 12/17/2019   HCT 42.7 12/17/2019   MCV 80 12/17/2019   PLT 298 12/17/2019     Attestation Statements:   Reviewed by clinician on day of visit: allergies, medications, problem list, medical history, surgical history, family history, social history, and previous encounter notes.  Coral Ceo, am acting as Location manager for Mina Marble, NP.  I have reviewed the above documentation for accuracy and completeness, and I agree with the above. -  Dustine Bertini d. Irania Durell, NP-C

## 2020-09-15 ENCOUNTER — Encounter (INDEPENDENT_AMBULATORY_CARE_PROVIDER_SITE_OTHER): Payer: Self-pay | Admitting: Adult Health

## 2020-09-15 ENCOUNTER — Other Ambulatory Visit: Payer: Self-pay

## 2020-09-15 ENCOUNTER — Ambulatory Visit (INDEPENDENT_AMBULATORY_CARE_PROVIDER_SITE_OTHER): Payer: Commercial Managed Care - PPO | Admitting: Adult Health

## 2020-09-15 VITALS — BP 131/81 | HR 87 | Temp 97.3°F | Ht 62.0 in | Wt 214.0 lb

## 2020-09-15 DIAGNOSIS — Z9189 Other specified personal risk factors, not elsewhere classified: Secondary | ICD-10-CM | POA: Diagnosis not present

## 2020-09-15 DIAGNOSIS — Z6841 Body Mass Index (BMI) 40.0 and over, adult: Secondary | ICD-10-CM | POA: Diagnosis not present

## 2020-09-15 DIAGNOSIS — R7303 Prediabetes: Secondary | ICD-10-CM | POA: Diagnosis not present

## 2020-09-15 DIAGNOSIS — F17200 Nicotine dependence, unspecified, uncomplicated: Secondary | ICD-10-CM | POA: Diagnosis not present

## 2020-09-15 MED ORDER — METFORMIN HCL 500 MG PO TABS: 500.0000 mg | ORAL_TABLET | Freq: Every morning | ORAL | 0 refills | Status: DC

## 2020-09-16 NOTE — Progress Notes (Signed)
Chief Complaint:   OBESITY Sheena [name] is here to discuss her progress with her obesity treatment plan along with follow-up of her obesity related diagnoses. Sheena [name] is on the Category 2 Plan + 100 calories and states she is following her eating plan approximately 70% of the time. Sheena [name] states she is doing 0 minutes 0 times per week.  Today's visit was #: 15 Starting weight: 250 lbs Starting date: 08/29/2019 Today's weight: 214 lbs Today's date: 09/15/2020 Total lbs lost to date: 36 lbs Total lbs lost since last in-office visit: 0  Interim History: Sheena [name] has enjoyed the structure and services of [facility] -the program is in week 6.  She has started following Category 2 meal plan more consistently the last few weeks.  She will travel to [facility] with friends for a 4 day weekend at the end of this week.  Subjective:   1. Pre-diabetes Sheena [name]'s 05/20/2020 BG was 91, A1c 5.5, and an elevated insulin level of 14.0. She is on Metformin 500 mg with breakfast and tolerating it well.  Lab Results  Component Value Date   HGBA1C 5.5 05/20/2020   Lab Results  Component Value Date   INSULIN 14.0 05/20/2020   INSULIN 11.7 12/17/2019   INSULIN 15.0 08/29/2019    2. Tobacco use disorder Sheena [name] is smoking less than a pack of cigarettes/day. She is trying tot reduce use. She has tried Wellbutrin, but it caused emotional liability, therefore she is unable to tolerate it. She also tried Nicoderm patch, which caused nausea, again unable to tolerate.  3. At risk for diabetes mellitus Sheena [name] is at higher than average risk for developing diabetes due to obesity and insulin resistance.  Assessment/Plan:   1. Pre-diabetes Sheena [name] will continue to work on weight loss, exercise, and decreasing simple carbohydrates to help decrease the risk of diabetes. Check labs at next OV.  - metFORMIN (GLUCOPHAGE) 500 MG tablet; Take 1 tablet (500 mg total) by mouth every morning.  Dispense: 30 tablet; Refill: 0  2.  Tobacco use disorder Try to reduce cigarette use daily. Try OTC Nicoderm gum.  3. At risk for diabetes mellitus Sheena [name] was given approximately 15 minutes of diabetes education and counseling today. We discussed intensive lifestyle modifications today with an emphasis on weight loss as well as increasing exercise and decreasing simple carbohydrates in her diet. We also reviewed medication options with an emphasis on risk versus benefit of those discussed.   Repetitive spaced learning was employed today to elicit superior memory formation and behavioral change.  4. Current BMI 39.3 Sheena [name] is currently in the action stage of change. As such, her goal is to continue with weight loss efforts. She has agreed to the Category 2 Plan + 100 calories.   Exercise goals: No exercise has been prescribed at this time.  Behavioral modification strategies: increasing lean protein intake, meal planning and cooking strategies, travel eating strategies and planning for success.  Sheena [name] has agreed to follow-up with our clinic in 3 weeks. She was informed of the importance of frequent follow-up visits to maximize her success with intensive lifestyle modifications for her multiple health conditions.   Objective:   Blood pressure 131/81, pulse 87, temperature (!) 97.3 F (36.3 C), height 5\' 2"  (1.575 m), weight 214 lb (97.1 kg), last menstrual period 01/29/2017, SpO2 100 %. Body mass index is 39.14 kg/m.  General: Cooperative, alert, well developed, in no acute distress. HEENT: Conjunctivae and lids unremarkable. Cardiovascular: Regular rhythm.  Lungs: Normal work of  breathing. Neurologic: No focal deficits.   Lab Results  Component Value Date   CREATININE 0.92 05/20/2020   BUN 22 05/20/2020   NA 140 05/20/2020   K 4.0 05/20/2020   CL 105 05/20/2020   CO2 22 05/20/2020   Lab Results  Component Value Date   ALT 210 (H) 05/20/2020   AST 169 (H) 05/20/2020   ALKPHOS 68 05/20/2020   BILITOT 0.5 05/20/2020    Lab Results  Component Value Date   HGBA1C 5.5 05/20/2020   HGBA1C 5.4 12/17/2019   HGBA1C 5.9 (H) 08/29/2019   Lab Results  Component Value Date   INSULIN 14.0 05/20/2020   INSULIN 11.7 12/17/2019   INSULIN 15.0 08/29/2019   Lab Results  Component Value Date   TSH 0.054 (L) 12/17/2019   Lab Results  Component Value Date   CHOL 148 05/20/2020   HDL 46 05/20/2020   LDLCALC 82 05/20/2020   TRIG 113 05/20/2020   CHOLHDL 3.2 05/20/2020   Lab Results  Component Value Date   WBC 6.9 12/17/2019   HGB 13.1 12/17/2019   HCT 42.7 12/17/2019   MCV 80 12/17/2019   PLT 298 12/17/2019     Attestation Statements:   Reviewed by clinician on day of visit: allergies, medications, problem list, medical history, surgical history, family history, social history, and previous encounter notes.  Coral Ceo, am acting as Location manager for Mina Marble, NP.  I have reviewed the above documentation for accuracy and completeness, and I agree with the above. -  Najiyah Paris d. Liisa Picone, NP-C

## 2020-10-19 ENCOUNTER — Ambulatory Visit (INDEPENDENT_AMBULATORY_CARE_PROVIDER_SITE_OTHER): Payer: Commercial Managed Care - PPO | Admitting: Adult Health

## 2020-10-19 ENCOUNTER — Encounter (INDEPENDENT_AMBULATORY_CARE_PROVIDER_SITE_OTHER): Payer: Self-pay | Admitting: Adult Health

## 2020-10-19 ENCOUNTER — Other Ambulatory Visit: Payer: Self-pay

## 2020-10-19 VITALS — BP 123/80 | HR 94 | Temp 97.8°F | Ht 62.0 in | Wt 212.0 lb

## 2020-10-19 DIAGNOSIS — E559 Vitamin D deficiency, unspecified: Secondary | ICD-10-CM | POA: Diagnosis not present

## 2020-10-19 DIAGNOSIS — R7303 Prediabetes: Secondary | ICD-10-CM | POA: Diagnosis not present

## 2020-10-19 DIAGNOSIS — E6609 Other obesity due to excess calories: Secondary | ICD-10-CM

## 2020-10-19 DIAGNOSIS — Z9189 Other specified personal risk factors, not elsewhere classified: Secondary | ICD-10-CM | POA: Diagnosis not present

## 2020-10-19 DIAGNOSIS — Z6838 Body mass index (BMI) 38.0-38.9, adult: Secondary | ICD-10-CM

## 2020-10-19 DIAGNOSIS — F17200 Nicotine dependence, unspecified, uncomplicated: Secondary | ICD-10-CM | POA: Diagnosis not present

## 2020-10-19 MED ORDER — METFORMIN HCL 500 MG PO TABS: 500.0000 mg | ORAL_TABLET | Freq: Every morning | ORAL | 0 refills | Status: DC

## 2020-10-20 LAB — INSULIN, RANDOM: INSULIN: 9.8 u[IU]/mL (ref 2.6–24.9)

## 2020-10-20 NOTE — Progress Notes (Signed)
Chief Complaint:   OBESITY Sheena [name] is here to discuss her progress with her obesity treatment plan along with follow-up of her obesity related diagnoses. Sheena [name] is on the Category 2 Plan + 100 calories and states she is following her eating plan approximately 75% of the time. Sheena [name] states she is not currently exercising.  Today's visit was #: 16 Starting weight: 250 lbs Starting date: 08/29/2019 Today's weight: 212 lbs Today's date: 10/19/2020 Total lbs lost to date: 38 lbs Total lbs lost since last in-office visit: 2  Interim History: Sheena [name] will possibly have her left total knee replacement in July 2022-current BMI is 38.9. She enjoyed a recent girls trip the to beach and utilized [employer] during the vacation. She continues to benefit from [facility] at [employer] to process the recent deaths of her mother and husband.  Subjective:   1. Pre-diabetes 05/20/2020 A1c 5.5 with normal BG 91 and elevated insulin level 14.0. She is on Metformin 500 mg with breakfast.  Her PCP checked CMP 08/18/2020- GFR 58, LFT's WNL, and A1c 5.4. Insulin level was not checked.  Lab Results  Component Value Date   HGBA1C 5.5 05/20/2020   Lab Results  Component Value Date   INSULIN 14.0 05/20/2020   INSULIN 11.7 12/17/2019   INSULIN 15.0 08/29/2019   2. Vitamin D deficiency Sheena [name]'s Vitamin D level was 22.7 on 08/18/2020. She is currently taking Women's [employer] by PCP. She denies nausea, vomiting or muscle weakness.  3. Tobacco use disorder Sheena [name] continues to smoke 1/2 pack cigarettes per day.  4. At risk for diabetes mellitus Sheena [name] is at higher than average risk for developing diabetes due to obesity.   Assessment/Plan:   1. Pre-diabetes Sheena [name] will continue to work on weight loss, exercise, and decreasing simple carbohydrates to help decrease the risk of diabetes. Check insulin level today.  - metFORMIN (GLUCOPHAGE) 500 MG tablet; Take 1 tablet (500 mg total) by mouth every morning.   Dispense: 30 tablet; Refill: 0 - Insulin, random  2. Vitamin D deficiency Low Vitamin D level contributes to fatigue and are associated with obesity, breast, and colon cancer. She agrees to continue to take [employer] and will follow-up for routine testing of Vitamin D, at least 2-3 times per year to avoid over-replacement.  3. Tobacco use disorder Start Nicoderm gum- tropical flavor.  4. At risk for diabetes mellitus Sheena [name] was given approximately 15 minutes of diabetes education and counseling today. We discussed intensive lifestyle modifications today with an emphasis on weight loss as well as increasing exercise and decreasing simple carbohydrates in her diet. We also reviewed medication options with an emphasis on risk versus benefit of those discussed.   Repetitive spaced learning was employed today to elicit superior memory formation and behavioral change.  5. Obesity with current BMI of 38.9  Sheena [name] is currently in the action stage of change. As such, her goal is to continue with weight loss efforts. She has agreed to the Category 2 Plan + 100 calories.   Exercise goals: As is  Behavioral modification strategies: increasing lean protein intake, meal planning and cooking strategies and planning for success.  Sheena [name] has agreed to follow-up with our clinic in 3 weeks. She was informed of the importance of frequent follow-up visits to maximize her success with intensive lifestyle modifications for her multiple health conditions.   Sheena [name] was informed we would discuss her lab results at her next visit unless there is a critical issue  that needs to be addressed sooner. Sheena [name] agreed to keep her next visit at the agreed upon time to discuss these results.  Objective:   Blood pressure 123/80, pulse 94, temperature 97.8 F (36.6 C), height 5\' 2"  (1.575 m), weight 212 lb (96.2 kg), last menstrual period 01/29/2017, SpO2 98 %. Body mass index is 38.78 kg/m.  General:  Cooperative, alert, well developed, in no acute distress. HEENT: Conjunctivae and lids unremarkable. Cardiovascular: Regular rhythm.  Lungs: Normal work of breathing. Neurologic: No focal deficits.   Lab Results  Component Value Date   CREATININE 0.92 05/20/2020   BUN 22 05/20/2020   NA 140 05/20/2020   K 4.0 05/20/2020   CL 105 05/20/2020   CO2 22 05/20/2020   Lab Results  Component Value Date   ALT 210 (H) 05/20/2020   AST 169 (H) 05/20/2020   ALKPHOS 68 05/20/2020   BILITOT 0.5 05/20/2020   Lab Results  Component Value Date   HGBA1C 5.5 05/20/2020   HGBA1C 5.4 12/17/2019   HGBA1C 5.9 (H) 08/29/2019   Lab Results  Component Value Date   INSULIN 14.0 05/20/2020   INSULIN 11.7 12/17/2019   INSULIN 15.0 08/29/2019   Lab Results  Component Value Date   TSH 0.054 (L) 12/17/2019   Lab Results  Component Value Date   CHOL 148 05/20/2020   HDL 46 05/20/2020   LDLCALC 82 05/20/2020   TRIG 113 05/20/2020   CHOLHDL 3.2 05/20/2020   Lab Results  Component Value Date   WBC 6.9 12/17/2019   HGB 13.1 12/17/2019   HCT 42.7 12/17/2019   MCV 80 12/17/2019   PLT 298 12/17/2019   No results found for: IRON, TIBC, FERRITIN  Attestation Statements:   Reviewed by clinician on day of visit: allergies, medications, problem list, medical history, surgical history, family history, social history, and previous encounter notes.  Coral Ceo, am acting as Location manager for Mina Marble, NP.  I have reviewed the above documentation for accuracy and completeness, and I agree with the above. -  Zori Benbrook d. Rudie Sermons, NP-C

## 2020-11-17 ENCOUNTER — Ambulatory Visit (INDEPENDENT_AMBULATORY_CARE_PROVIDER_SITE_OTHER): Payer: Commercial Managed Care - PPO | Admitting: Adult Health

## 2020-12-01 ENCOUNTER — Encounter: Payer: Self-pay | Admitting: Gastroenterology

## 2020-12-01 ENCOUNTER — Other Ambulatory Visit: Payer: Self-pay

## 2020-12-01 ENCOUNTER — Ambulatory Visit (INDEPENDENT_AMBULATORY_CARE_PROVIDER_SITE_OTHER): Payer: Commercial Managed Care - PPO | Admitting: Gastroenterology

## 2020-12-01 VITALS — BP 122/80 | HR 90 | Ht 62.0 in | Wt 217.0 lb

## 2020-12-01 DIAGNOSIS — K921 Melena: Secondary | ICD-10-CM | POA: Diagnosis not present

## 2020-12-01 DIAGNOSIS — R945 Abnormal results of liver function studies: Secondary | ICD-10-CM | POA: Insufficient documentation

## 2020-12-01 DIAGNOSIS — K219 Gastro-esophageal reflux disease without esophagitis: Secondary | ICD-10-CM

## 2020-12-01 MED ORDER — NA SULFATE-K SULFATE-MG SULF 17.5-3.13-1.6 GM/177ML PO SOLN: 1.0000 | Freq: Once | ORAL | 0 refills | Status: AC

## 2020-12-01 NOTE — Progress Notes (Signed)
History of Present Illness: This is a 55 year old female referred by Reynold Bowen, MD for the evaluation of hematochezia. She relates intermittent small volume hematochezia with bowel movements for several years. She underwent colonoscopy in 2015 showing 3 small hyperplastic colon polyps. Her GERD is well controlled on daily omeprazole. She has knee replacement surgery scheduled in mid July with Dr. Theda Sers. Denies weight loss, abdominal pain, constipation, diarrhea, change in stool caliber, melena, nausea, vomiting, dysphagia, chest pain.     Allergies  Allergen Reactions   Sulfa Antibiotics Swelling   Outpatient Medications Prior to Visit  Medication Sig Dispense Refill   ALPRAZolam (XANAX) 0.5 MG tablet 2 (two) times daily as needed.      DULoxetine (CYMBALTA) 30 MG capsule Take 1 capsule (30 mg total) by mouth daily. (Patient taking differently: Take 30 mg by mouth at bedtime.) 30 capsule 0   FENOFIBRATE PO Take 160 mg by mouth every evening.      levothyroxine (SYNTHROID) 175 MCG tablet Euthyrox 175 mcg tablet     lisdexamfetamine (VYVANSE) 30 MG capsule Take 30 mg by mouth daily.     metFORMIN (GLUCOPHAGE) 500 MG tablet Take 1 tablet (500 mg total) by mouth every morning. 30 tablet 0   Omeprazole (PRILOSEC PO) Take 20 mg by mouth daily.      TOPIRAMATE PO Take 50 mg by mouth daily.      traZODone (DESYREL) 50 MG tablet at bedtime.      No facility-administered medications prior to visit.   Past Medical History:  Diagnosis Date   ADHD    Anemia    Anxiety    Back pain    Chronic kidney disease    Depression    GERD (gastroesophageal reflux disease)    High cholesterol    IC (interstitial cystitis)    Interstitial cystitis    Knee pain, bilateral    Obesity    Osteoarthritis    PMB (postmenopausal bleeding)    Pre-diabetes    STD (sexually transmitted disease)    HSV ll   Thyroid disease    Hypothyroid   Vitamin D deficiency    Past Surgical History:   Procedure Laterality Date   CESAREAN SECTION     x 1   colonscopy     FOOT SURGERY Right    HYSTEROSCOPY WITH D & C N/A 10/31/2019   Procedure: DILATATION AND CURETTAGE /HYSTEROSCOPY;  Surgeon: Vanessa Kick, MD;  Location: [facility];  Service: Gynecology;  Laterality: N/A;   INJECTION KNEE Left 10/23/2019   versi knee gel injection and 10-30-19 to be done   WISDOM TOOTH EXTRACTION  2000   Social History   Socioeconomic History   Marital status: Widowed    Spouse name: Not on file   Number of children: 1   Years of education: Not on file   Highest education level: Not on file  Occupational History   Occupation: Caseworker    Employer: [employer]  Tobacco Use   Smoking status: Every Day    Packs/day: 1.00    Years: 20.00    Pack years: 20.00    Types: Cigarettes   Smokeless tobacco: Never  Vaping Use   Vaping Use: Never used  Substance and Sexual Activity   Alcohol use: No    Alcohol/week: 0.0 standard drinks   Drug use: No   Sexual activity: Not Currently    Comment: 1st intercourse 55 yo-Fewer than 5 partners  Other Topics Concern  Not on file  Social History Narrative   Not on file   Social Determinants of Health   Financial Resource Strain: Not on file  Food Insecurity: Not on file  Transportation Needs: Not on file  Physical Activity: Not on file  Stress: Not on file  Social Connections: Not on file   Family History  Problem Relation Age of Onset   Rheum arthritis Mother    Hyperlipidemia Mother    Thyroid disease Mother    Obesity Mother    Diabetes Father    Hypertension Father    Melanoma Father        melanoma   Colon polyps Father    Crohn's disease Father    Cancer Father        ? type   Hyperlipidemia Father    Thyroid disease Father    Obesity Father    Breast cancer Cousin 67       Pat 1st cousin   Colon cancer Neg Hx    Pancreatic cancer Neg Hx    Esophageal cancer Neg Hx        Review of Systems:  Pertinent positive and negative review of systems were noted in the above HPI section. All other review of systems were otherwise negative.   Physical Exam: General: Well developed, well nourished, no acute distress Head: Normocephalic and atraumatic Eyes: Sclerae anicteric, EOMI Ears: Normal auditory acuity Mouth: Not examined, mask on during Covid-19 pandemic Neck: Supple, no masses or thyromegaly Lungs: Clear throughout to auscultation Heart: Regular rate and rhythm; no murmurs, rubs or bruits Abdomen: Soft, non tender and non distended. No masses, hepatosplenomegaly or hernias noted. Normal Bowel sounds Rectal: Deferred to colonoscopy  Musculoskeletal: Symmetrical with no gross deformities  Skin: No lesions on visible extremities Pulses:  Normal pulses noted Extremities: No clubbing, cyanosis, edema or deformities noted Neurological: Alert oriented x 4, grossly nonfocal Cervical Nodes:  No significant cervical adenopathy Inguinal Nodes: No significant inguinal adenopathy Psychological:  Alert and cooperative. Normal mood and affect   Assessment and Recommendations:  Small volume hematochezia. R/O colorectal neoplasms, hemorrhoids, etc. Schedule colonoscopy. The risks (including bleeding, perforation, infection, missed lesions, medication reactions and possible hospitalization or surgery if complications occur), benefits, and alternatives to colonoscopy with possible biopsy and possible polypectomy were discussed with the patient and they consent to proceed.    2.  GERD. Continue omeprazole 20 mg po qd and follow antireflux measures. EGD  in 2015 showed a small hiatal hernia and was otherwise normal.   3. Mildly elevated ALT of 60 in 05/2020. LFTs were normal in 08/2020. Repeat LFTs in 3 months. If LFTs are again elevated recommend RUQ [place].    cc: Reynold Bowen, MD [address] [place],  [place] [phone]

## 2020-12-01 NOTE — Patient Instructions (Signed)
You have been scheduled for a colonoscopy. Please follow written instructions given to you at your visit today.  Please pick up your prep supplies at the pharmacy within the next 1-3 days. If you use inhalers (even only as needed), please bring them with you on the day of your procedure.  Normal BMI (Body Mass Index- based on height and weight) is between 19 and 25. Your BMI today is Body mass index is 39.69 kg/m. Sheena [name] Please consider follow up  regarding your BMI with your Primary Care Provider.  Thank you for choosing me and [facility].  Pricilla Riffle. Dagoberto Ligas., MD., Mar[facility] Regal

## 2020-12-03 ENCOUNTER — Ambulatory Visit (INDEPENDENT_AMBULATORY_CARE_PROVIDER_SITE_OTHER): Payer: Commercial Managed Care - PPO | Admitting: Adult Health

## 2020-12-03 ENCOUNTER — Encounter (INDEPENDENT_AMBULATORY_CARE_PROVIDER_SITE_OTHER): Payer: Self-pay | Admitting: Adult Health

## 2020-12-03 ENCOUNTER — Other Ambulatory Visit: Payer: Self-pay

## 2020-12-03 VITALS — BP 123/73 | HR 77 | Temp 97.4°F | Ht 62.0 in | Wt 214.0 lb

## 2020-12-03 DIAGNOSIS — M25562 Pain in left knee: Secondary | ICD-10-CM | POA: Diagnosis not present

## 2020-12-03 DIAGNOSIS — Z9189 Other specified personal risk factors, not elsewhere classified: Secondary | ICD-10-CM | POA: Diagnosis not present

## 2020-12-03 DIAGNOSIS — F17200 Nicotine dependence, unspecified, uncomplicated: Secondary | ICD-10-CM | POA: Diagnosis not present

## 2020-12-03 DIAGNOSIS — E6609 Other obesity due to excess calories: Secondary | ICD-10-CM

## 2020-12-03 DIAGNOSIS — R7303 Prediabetes: Secondary | ICD-10-CM

## 2020-12-03 DIAGNOSIS — G8929 Other chronic pain: Secondary | ICD-10-CM

## 2020-12-03 DIAGNOSIS — Z6838 Body mass index (BMI) 38.0-38.9, adult: Secondary | ICD-10-CM

## 2020-12-03 MED ORDER — METFORMIN HCL 500 MG PO TABS
500.0000 mg | ORAL_TABLET | Freq: Every morning | ORAL | 0 refills | Status: DC
Start: 2020-12-03 — End: 2020-12-29
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
## 2020-12-09 ENCOUNTER — Encounter: Payer: Commercial Managed Care - PPO | Admitting: Gastroenterology

## 2020-12-29 ENCOUNTER — Other Ambulatory Visit: Payer: Self-pay

## 2020-12-29 ENCOUNTER — Encounter (INDEPENDENT_AMBULATORY_CARE_PROVIDER_SITE_OTHER): Payer: Self-pay | Admitting: Adult Health

## 2020-12-29 ENCOUNTER — Ambulatory Visit (INDEPENDENT_AMBULATORY_CARE_PROVIDER_SITE_OTHER): Payer: Commercial Managed Care - PPO | Admitting: Adult Health

## 2020-12-29 VITALS — BP 123/74 | HR 92 | Temp 98.0°F | Ht 62.0 in | Wt 212.0 lb

## 2020-12-29 DIAGNOSIS — M25562 Pain in left knee: Secondary | ICD-10-CM | POA: Diagnosis not present

## 2020-12-29 DIAGNOSIS — R7303 Prediabetes: Secondary | ICD-10-CM

## 2020-12-29 DIAGNOSIS — Z6838 Body mass index (BMI) 38.0-38.9, adult: Secondary | ICD-10-CM

## 2020-12-29 DIAGNOSIS — F17200 Nicotine dependence, unspecified, uncomplicated: Secondary | ICD-10-CM

## 2020-12-29 DIAGNOSIS — E6609 Other obesity due to excess calories: Secondary | ICD-10-CM

## 2020-12-29 DIAGNOSIS — Z9189 Other specified personal risk factors, not elsewhere classified: Secondary | ICD-10-CM

## 2020-12-29 DIAGNOSIS — G8929 Other chronic pain: Secondary | ICD-10-CM

## 2020-12-29 MED ORDER — METFORMIN HCL 500 MG PO TABS: 500.0000 mg | ORAL_TABLET | Freq: Every morning | ORAL | 0 refills | Status: DC
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
## 2021-01-04 ENCOUNTER — Encounter: Payer: Self-pay | Admitting: Physical Therapy

## 2021-01-04 ENCOUNTER — Other Ambulatory Visit: Payer: Self-pay

## 2021-01-04 ENCOUNTER — Ambulatory Visit: Payer: Commercial Managed Care - PPO | Attending: Specialist | Admitting: Physical Therapy

## 2021-01-04 DIAGNOSIS — M25662 Stiffness of left knee, not elsewhere classified: Secondary | ICD-10-CM | POA: Insufficient documentation

## 2021-01-04 DIAGNOSIS — M25562 Pain in left knee: Secondary | ICD-10-CM | POA: Insufficient documentation

## 2021-01-04 DIAGNOSIS — M6281 Muscle weakness (generalized): Secondary | ICD-10-CM | POA: Diagnosis present

## 2021-01-04 DIAGNOSIS — R600 Localized edema: Secondary | ICD-10-CM | POA: Insufficient documentation

## 2021-01-04 DIAGNOSIS — G8929 Other chronic pain: Secondary | ICD-10-CM | POA: Insufficient documentation

## 2021-01-04 NOTE — Therapy (Signed)
[facility] [place], [place], [address] Phone: [phone]   Fax:  [phone]  Physical Therapy Evaluation  Patient Details  Name: Sheena [name] MRN: [id number] Date of Birth: 1966/05/03 Referring Provider (PT): Sydnee Cabal MD   Encounter Date: 01/04/2021   PT End of Session - 01/04/21 1339     Visit Number 1    Number of Visits 12    Date for PT Re-Evaluation 02/01/21    Authorization Type FOTO AT LEAST EVERY 5TH VISIT.  PROGRESS NOTE AT 10TH VISIT.    PT Start Time 3016    PT Stop Time 1339    PT Time Calculation (min) 41 min    Activity Tolerance Patient tolerated treatment well    Behavior During Therapy WFL for tasks assessed/performed             Past Medical History:  Diagnosis Date   ADHD    Anemia    Anxiety    Back pain    Chronic kidney disease    Depression    GERD (gastroesophageal reflux disease)    High cholesterol    IC (interstitial cystitis)    Interstitial cystitis    Knee pain, bilateral    Obesity    Osteoarthritis    PMB (postmenopausal bleeding)    Pre-diabetes    STD (sexually transmitted disease)    HSV ll   Thyroid disease    Hypothyroid   Vitamin D deficiency     Past Surgical History:  Procedure Laterality Date   CESAREAN SECTION     x 1   colonscopy     FOOT SURGERY Right    HYSTEROSCOPY WITH D & C N/A 10/31/2019   Procedure: DILATATION AND CURETTAGE /HYSTEROSCOPY;  Surgeon: Vanessa Kick, MD;  Location: [place];  Service: Gynecology;  Laterality: N/A;   INJECTION KNEE Left 10/23/2019   versi knee gel injection and 10-30-19 to be done   [place]    There were no vitals filed for this visit.    Subjective Assessment - 01/04/21 1311     Subjective COVID-19 screen performed prior to patient entering clinic.  The patient presents to the clinic today s/p left total knee replacement performed on 12/31/20.  She is complinat to  her HEP and wearing hewr TED hose.  She is walking safely with a FWW today.  Her resting pain-level is a 3/10 today and higher with bending her knee and increased activity.  Rest decreases her pain.    Pertinent History Right foot surgery, left ACL reconstruction, h/o back pain, GERD.    Patient Stated Goals Be more independent/go to work.    Currently in Pain? Yes    Pain Score 3     Pain Location Knee    Pain Orientation Left    Pain Descriptors / Indicators Aching    Pain Type Surgical pain    Pain Onset In the past 7 days    Pain Frequency Constant    Aggravating Factors  See above.    Pain Relieving Factors See above.                [facility] PT Assessment - 01/04/21 0001       Assessment   Medical Diagnosis Left total knee replacement.    Referring Provider (PT) Sydnee Cabal MD    Onset Date/Surgical Date --   12/31/20 (surgery date).     Precautions   Precaution Comments  No ultrasound.      Restrictions   Weight Bearing Restrictions No      Balance Screen   Has the patient fallen in the past 6 months No    Has the patient had a decrease in activity level because of a fear of falling?  Yes    Is the patient reluctant to leave their home because of a fear of falling?  No      Home Environment   Living Environment Private residence      Prior Function   Level of Independence Independent      Observation/Other Assessments   Observations TED hose donned.  Aquacel intact over left knee.    Focus on Therapeutic Outcomes (FOTO)  Complete.      Observation/Other Assessments-Edema    Edema Circumferential      Circumferential Edema   Circumferential - Right LT 8 cms > RT.      ROM / Strength   AROM / PROM / Strength AROM;Strength      AROM   Overall AROM Comments -5 degrees of active left knee extension to 75 degrees of left active knee flexion.      Strength   Overall Strength Comments Left hip flexion is 4-/5, right knee extension is 4/5.      Palpation    Palpation comment Diffuse palpable anterior left knee pain.      Ambulation/Gait   Gait Comments Safe ambuation with a FWW with a decreased step length.                        Objective measurements completed on examination: See above findings.       [facility] Adult PT Treatment/Exercise - 01/04/21 0001       Modalities   Modalities Vasopneumatic      Vasopneumatic   Number Minutes Vasopneumatic  15 minutes    Vasopnuematic Location  --   Left knee.   Vasopneumatic Pressure Low                         PT Long Term Goals - 01/04/21 1331       PT LONG TERM GOAL #1   Title Independent with a HEP.    Time 4    Period Weeks    Status New      PT LONG TERM GOAL #2   Title Full active left knee extension in order to normalize gait.    Time 4    Period Weeks    Status New      PT LONG TERM GOAL #3   Title Active knee flexion to 115 degrees+ so the patient can perform functional tasks and do so with pain not > 2-3/10.    Time 4    Period Weeks    Status New      PT LONG TERM GOAL #4   Title Increase left hip and knee strength to a solid 4+/5 to provide good stability for accomplishment of functional activities.    Time 4    Period Weeks    Status New      PT LONG TERM GOAL #5   Title Perform a reciprocating stair gait with one railing with pain not > 2-3/10.    Time 4    Period Weeks    Status New                    Plan -  01/04/21 1326     Clinical Impression Statement The patient presents to OPPT s/p left total knee replacement performed on 12/31/20.  She is pleased with her surgical outcome thus far.  She is performing her HEP.  She demonstrates a significant amount of swelling currently.  She has an expected loss of left knee range of motion and strength.  She is walking safely with a FWW.  Patient will benefit from skilled physical therapy intervention to address pain and deficits.    Personal Factors and Comorbidities  Comorbidity 1;Comorbidity 2;Other    Comorbidities Right foot surgery, left ACL reconstruction, h/o back pain, GERD.    Examination-Activity Limitations Locomotion Level;Other    Examination-Participation Restrictions Other    Stability/Clinical Decision Making Stable/Uncomplicated    Clinical Decision Making Low    Rehab Potential Excellent    PT Frequency 3x / week    PT Duration 4 weeks    PT Treatment/Interventions ADLs/Self Care Home Management;Cryotherapy;Electrical Stimulation;Stair training;Functional mobility training;Gait training;Therapeutic activities;Therapeutic exercise;Neuromuscular re-education;Manual techniques;Patient/family education;Passive range of motion;Vasopneumatic Device    PT Next Visit Plan Nustep, left knee PROM, progress into TKA protocol.  Vasopneumatic.    Consulted and Agree with Plan of Care Patient             Patient will benefit from skilled therapeutic intervention in order to improve the following deficits and impairments:  Abnormal gait, Decreased activity tolerance, Decreased range of motion, Decreased strength, Increased edema, Pain  Visit Diagnosis: Chronic pain of left knee - Plan: PT plan of care cert/re-cert  Stiffness of left knee, not elsewhere classified - Plan: PT plan of care cert/re-cert  Localized edema - Plan: PT plan of care cert/re-cert  Muscle weakness (generalized) - Plan: PT plan of care cert/re-cert     Problem List Patient Active Problem List   Diagnosis Date Noted   Chronic pain of left knee 12/03/2020   Abnormal liver function 12/01/2020   Class 2 obesity due to excess calories with body mass index (BMI) of 38.0 to 38.9 in adult 08/27/2020   Tobacco use disorder 08/27/2020   Transaminitis 07/04/2020   Chronic kidney disease, stage 3a ([place]) 01/07/2020   History of iron deficiency anemia 01/07/2020   Folate deficiency 01/07/2020   Polyp of colon 01/06/2020   Prediabetes 12/17/2019   Vitamin D deficiency  12/17/2019   Class 3 severe obesity with serious comorbidity and body mass index (BMI) of 40.0 to 44.9 in adult ([place]) 12/17/2019   Absolute anemia 02/20/2014   Anemia, iron deficiency 02/20/2014   Esophageal reflux 02/20/2014   Other hyperlipidemia    GERD (gastroesophageal reflux disease)    STD (sexually transmitted disease)    Hypothyroidism    IC (interstitial cystitis)     Ryah Cribb, [place] MPT 01/04/2021, 1:42 PM  [facility] [address] [place], [place], [address] Phone: [phone]   Fax:  [phone]  Name: CORDELLA NYQUIST MRN: [id number] Date of Birth: August 03, 1965

## 2021-01-07 ENCOUNTER — Ambulatory Visit: Payer: Commercial Managed Care - PPO | Admitting: Physical Therapy

## 2021-01-07 ENCOUNTER — Encounter: Payer: Self-pay | Admitting: Physical Therapy

## 2021-01-07 ENCOUNTER — Other Ambulatory Visit: Payer: Self-pay

## 2021-01-07 DIAGNOSIS — M25662 Stiffness of left knee, not elsewhere classified: Secondary | ICD-10-CM

## 2021-01-07 DIAGNOSIS — R600 Localized edema: Secondary | ICD-10-CM

## 2021-01-07 DIAGNOSIS — M25562 Pain in left knee: Secondary | ICD-10-CM | POA: Diagnosis not present

## 2021-01-07 DIAGNOSIS — M6281 Muscle weakness (generalized): Secondary | ICD-10-CM

## 2021-01-07 DIAGNOSIS — G8929 Other chronic pain: Secondary | ICD-10-CM

## 2021-01-07 NOTE — Therapy (Signed)
[facility] [place], [place], [address] Phone: [phone]   Fax:  [phone]  Physical Therapy Treatment  Patient Details  Name: Sheena [name] MRN: [id number] Date of Birth: June 25, 1965 Referring Provider (PT): Sydnee Cabal MD   Encounter Date: 01/07/2021   PT End of Session - 01/07/21 1520     Visit Number 2    Number of Visits 12    Date for PT Re-Evaluation 02/01/21    Authorization Type FOTO AT LEAST EVERY 5TH VISIT.  PROGRESS NOTE AT 10TH VISIT.    PT Start Time 9622    PT Stop Time 1602    PT Time Calculation (min) 45 min    Equipment Utilized During Treatment Other (comment)   rollator   Activity Tolerance Patient tolerated treatment well    Behavior During Therapy WFL for tasks assessed/performed             Past Medical History:  Diagnosis Date   ADHD    Anemia    Anxiety    Back pain    Chronic kidney disease    Depression    GERD (gastroesophageal reflux disease)    High cholesterol    IC (interstitial cystitis)    Interstitial cystitis    Knee pain, bilateral    Obesity    Osteoarthritis    PMB (postmenopausal bleeding)    Pre-diabetes    STD (sexually transmitted disease)    HSV ll   Thyroid disease    Hypothyroid   Vitamin D deficiency     Past Surgical History:  Procedure Laterality Date   CESAREAN SECTION     x 1   colonscopy     FOOT SURGERY Right    HYSTEROSCOPY WITH D & C N/A 10/31/2019   Procedure: DILATATION AND CURETTAGE /HYSTEROSCOPY;  Surgeon: Vanessa Kick, MD;  Location: [place];  Service: Gynecology;  Laterality: N/A;   INJECTION KNEE Left 10/23/2019   versi knee gel injection and 10-30-19 to be done   [place]    There were no vitals filed for this visit.   Subjective Assessment - 01/07/21 1518     Subjective COVID-19 screen performed prior to patient entering clinic. Reports her knee is doing good but has been staying  with her son. Is compliant with zero gravity sponge. Finds stairs fairly easy. Reports the swelling is greatest limitation right now.    Pertinent History Right foot surgery, left ACL reconstruction, h/o back pain, GERD.    Patient Stated Goals Be more independent/go to work.    Currently in Pain? No/denies                [facility] PT Assessment - 01/07/21 0001       Assessment   Medical Diagnosis Left total knee replacement.    Referring Provider (PT) Sydnee Cabal MD    Onset Date/Surgical Date 12/31/20    Next MD Visit 01/15/2021      Precautions   Precaution Comments No ultrasound.      Restrictions   Weight Bearing Restrictions No                           [facility] Adult PT Treatment/Exercise - 01/07/21 0001       Exercises   Exercises Knee/Hip      Knee/Hip Exercises: Aerobic   Nustep L4, seat 6 x16 min      Knee/Hip Exercises:  Standing   Hip Flexion AROM;Left;10 reps    Forward Lunges Left;2 sets;10 reps;2 seconds    Forward Lunges Limitations off 6" step    Hip Abduction AROM;Left;20 reps;Knee straight    Rocker Board 3 minutes      Knee/Hip Exercises: Supine   Short Arc Quad Sets AROM;Left;2 sets;10 reps      Modalities   Modalities Vasopneumatic      Vasopneumatic   Number Minutes Vasopneumatic  10 minutes    Vasopnuematic Location  Knee    Vasopneumatic Pressure Low    Vasopneumatic Temperature  34/edema                         PT Long Term Goals - 01/04/21 1331       PT LONG TERM GOAL #1   Title Independent with a HEP.    Time 4    Period Weeks    Status New      PT LONG TERM GOAL #2   Title Full active left knee extension in order to normalize gait.    Time 4    Period Weeks    Status New      PT LONG TERM GOAL #3   Title Active knee flexion to 115 degrees+ so the patient can perform functional tasks and do so with pain not > 2-3/10.    Time 4    Period Weeks    Status New      PT LONG TERM GOAL #4    Title Increase left hip and knee strength to a solid 4+/5 to provide good stability for accomplishment of functional activities.    Time 4    Period Weeks    Status New      PT LONG TERM GOAL #5   Title Perform a reciprocating stair gait with one railing with pain not > 2-3/10.    Time 4    Period Weeks    Status New                   Plan - 01/07/21 1610     Clinical Impression Statement Patient presented in clinic with no complaints of L knee pain. Patient observed with minimal L hip hike on Nustep due to ROM limitations. Patient using rollator at this time for ambulation. B TED hose and post surgical bandage in place with patient reporting some purple bruising along inferior incision region from under aquacell. Patient also reported feeling pins and needles feel in L knee recently. Patient able to tolerate L knee ROM and stretching well. Fairly good L quad activation present with SAQ. Normal vasopneumatic response noted following removal of the modality.    Personal Factors and Comorbidities Comorbidity 1;Comorbidity 2;Other    Comorbidities Right foot surgery, left ACL reconstruction, h/o back pain, GERD.    Examination-Activity Limitations Locomotion Level;Other    Examination-Participation Restrictions Other    Stability/Clinical Decision Making Stable/Uncomplicated    Rehab Potential Excellent    PT Frequency 3x / week    PT Duration 4 weeks    PT Treatment/Interventions ADLs/Self Care Home Management;Cryotherapy;Electrical Stimulation;Stair training;Functional mobility training;Gait training;Therapeutic activities;Therapeutic exercise;Neuromuscular re-education;Manual techniques;Patient/family education;Passive range of motion;Vasopneumatic Device    PT Next Visit Plan Nustep, left knee PROM, progress into TKA protocol.  Vasopneumatic.    Consulted and Agree with Plan of Care Patient             Patient will benefit from skilled therapeutic intervention in order  to  improve the following deficits and impairments:  Abnormal gait, Decreased activity tolerance, Decreased range of motion, Decreased strength, Increased edema, Pain  Visit Diagnosis: Chronic pain of left knee  Stiffness of left knee, not elsewhere classified  Localized edema  Muscle weakness (generalized)     Problem List Patient Active Problem List   Diagnosis Date Noted   Chronic pain of left knee 12/03/2020   Abnormal liver function 12/01/2020   Class 2 obesity due to excess calories with body mass index (BMI) of 38.0 to 38.9 in adult 08/27/2020   Tobacco use disorder 08/27/2020   Transaminitis 07/04/2020   Chronic kidney disease, stage 3a ([place]) 01/07/2020   History of iron deficiency anemia 01/07/2020   Folate deficiency 01/07/2020   Polyp of colon 01/06/2020   Prediabetes 12/17/2019   Vitamin D deficiency 12/17/2019   Class 3 severe obesity with serious comorbidity and body mass index (BMI) of 40.0 to 44.9 in adult ([place]) 12/17/2019   Absolute anemia 02/20/2014   Anemia, iron deficiency 02/20/2014   Esophageal reflux 02/20/2014   Other hyperlipidemia    GERD (gastroesophageal reflux disease)    STD (sexually transmitted disease)    Hypothyroidism    IC (interstitial cystitis)     Standley Brooking, PTA 01/07/2021, [facility] [address] [place], [place], [address] Phone: [phone]   Fax:  [phone]  Name: JADAN ROUILLARD MRN: [id number] Date of Birth: Aug 16, 1965

## 2021-01-11 ENCOUNTER — Ambulatory Visit: Payer: Commercial Managed Care - PPO | Admitting: Physical Therapy

## 2021-01-11 ENCOUNTER — Encounter: Payer: Self-pay | Admitting: Physical Therapy

## 2021-01-11 ENCOUNTER — Other Ambulatory Visit: Payer: Self-pay

## 2021-01-11 DIAGNOSIS — G8929 Other chronic pain: Secondary | ICD-10-CM

## 2021-01-11 DIAGNOSIS — M25662 Stiffness of left knee, not elsewhere classified: Secondary | ICD-10-CM

## 2021-01-11 DIAGNOSIS — M25562 Pain in left knee: Secondary | ICD-10-CM | POA: Diagnosis not present

## 2021-01-11 DIAGNOSIS — M6281 Muscle weakness (generalized): Secondary | ICD-10-CM

## 2021-01-11 DIAGNOSIS — R600 Localized edema: Secondary | ICD-10-CM

## 2021-01-11 NOTE — Therapy (Signed)
[facility] [place], [place], [address] Phone: [phone]   Fax:  [phone]  Physical Therapy Treatment  Patient Details  Name: Sheena [name] MRN: [id number] Date of Birth: Nov 14, 1965 Referring Provider (PT): Sydnee Cabal MD   Encounter Date: 01/11/2021   PT End of Session - 01/11/21 1307     Visit Number 3    Number of Visits 12    Date for PT Re-Evaluation 02/01/21    Authorization Type FOTO AT LEAST EVERY 5TH VISIT.  PROGRESS NOTE AT 10TH VISIT.    PT Start Time 1302    PT Stop Time 1344    PT Time Calculation (min) 42 min    Equipment Utilized During Treatment Other (comment)   rollator   Activity Tolerance Patient tolerated treatment well    Behavior During Therapy WFL for tasks assessed/performed             Past Medical History:  Diagnosis Date   ADHD    Anemia    Anxiety    Back pain    Chronic kidney disease    Depression    GERD (gastroesophageal reflux disease)    High cholesterol    IC (interstitial cystitis)    Interstitial cystitis    Knee pain, bilateral    Obesity    Osteoarthritis    PMB (postmenopausal bleeding)    Pre-diabetes    STD (sexually transmitted disease)    HSV ll   Thyroid disease    Hypothyroid   Vitamin D deficiency     Past Surgical History:  Procedure Laterality Date   CESAREAN SECTION     x 1   colonscopy     FOOT SURGERY Right    HYSTEROSCOPY WITH D & C N/A 10/31/2019   Procedure: DILATATION AND CURETTAGE /HYSTEROSCOPY;  Surgeon: Vanessa Kick, MD;  Location: [place];  Service: Gynecology;  Laterality: N/A;   INJECTION KNEE Left 10/23/2019   versi knee gel injection and 10-30-19 to be done   [place]    There were no vitals filed for this visit.   Subjective Assessment - 01/11/21 1307     Subjective COVID-19 screen performed prior to patient entering clinic. No new complaints and no pain.    Pertinent History  Right foot surgery, left ACL reconstruction, h/o back pain, GERD.    Patient Stated Goals Be more independent/go to work.    Currently in Pain? No/denies                [facility] PT Assessment - 01/11/21 0001       Assessment   Medical Diagnosis Left total knee replacement.    Referring Provider (PT) Sydnee Cabal MD    Onset Date/Surgical Date 12/31/20    Next MD Visit 01/15/2021      Precautions   Precaution Comments No ultrasound.      ROM / Strength   AROM / PROM / Strength AROM      AROM   Overall AROM  Deficits    AROM Assessment Site Knee    Right/Left Knee Left    Left Knee Extension 2    Left Knee Flexion 95                           [facility] Adult PT Treatment/Exercise - 01/11/21 0001       Knee/Hip Exercises: Aerobic   Nustep L3, seat 9-8 x15  min      Knee/Hip Exercises: Standing   Hip Flexion AROM;Left;2 sets;10 reps;Knee bent    Forward Lunges Left;2 sets;10 reps;2 seconds    Forward Lunges Limitations off 6" step    Hip Abduction AROM;Left;20 reps;Knee straight    Rocker Board 3 minutes      Knee/Hip Exercises: Supine   Short Arc Quad Sets AROM;Left;2 sets;10 reps      Modalities   Modalities Vasopneumatic      Vasopneumatic   Number Minutes Vasopneumatic  10 minutes    Vasopnuematic Location  Knee    Vasopneumatic Pressure Low    Vasopneumatic Temperature  34/edema                         PT Long Term Goals - 01/11/21 1336       PT LONG TERM GOAL #1   Title Independent with a HEP.    Time 4    Period Weeks    Status On-going      PT LONG TERM GOAL #2   Title Full active left knee extension in order to normalize gait.    Time 4    Period Weeks    Status On-going      PT LONG TERM GOAL #3   Title Active knee flexion to 115 degrees+ so the patient can perform functional tasks and do so with pain not > 2-3/10.    Time 4    Period Weeks    Status On-going      PT LONG TERM GOAL #4   Title Increase  left hip and knee strength to a solid 4+/5 to provide good stability for accomplishment of functional activities.    Time 4    Period Weeks    Status On-going      PT LONG TERM GOAL #5   Title Perform a reciprocating stair gait with one railing with pain not > 2-3/10.    Time 4    Period Weeks    Status On-going                   Plan - 01/11/21 1343     Clinical Impression Statement Patient presented in clinic with no complaints of L knee pain. Patient does report discomfort of L knee as she is a stomach sleeper and finds herself turning to her stomach in the middle of the night. Patient able to tolerate therex well with postsurgical dressing in place. Patient's AROM of L knee measured as 2-95 deg. Patient using rollator but feels comfortable at this time to advance to cane. Normal vasopneumatic response noted following removal of the modality.    Personal Factors and Comorbidities Comorbidity 1;Comorbidity 2;Other    Comorbidities Right foot surgery, left ACL reconstruction, h/o back pain, GERD.    Examination-Activity Limitations Locomotion Level;Other    Examination-Participation Restrictions Other    Stability/Clinical Decision Making Stable/Uncomplicated    Rehab Potential Excellent    PT Frequency 3x / week    PT Duration 4 weeks    PT Treatment/Interventions ADLs/Self Care Home Management;Cryotherapy;Electrical Stimulation;Stair training;Functional mobility training;Gait training;Therapeutic activities;Therapeutic exercise;Neuromuscular re-education;Manual techniques;Patient/family education;Passive range of motion;Vasopneumatic Device    PT Next Visit Plan Nustep, left knee PROM, progress into TKA protocol.  Vasopneumatic.    Consulted and Agree with Plan of Care Patient             Patient will benefit from skilled therapeutic intervention in order to improve the following  deficits and impairments:  Abnormal gait, Decreased activity tolerance, Decreased range of  motion, Decreased strength, Increased edema, Pain  Visit Diagnosis: Chronic pain of left knee  Stiffness of left knee, not elsewhere classified  Localized edema  Muscle weakness (generalized)     Problem List Patient Active Problem List   Diagnosis Date Noted   Chronic pain of left knee 12/03/2020   Abnormal liver function 12/01/2020   Class 2 obesity due to excess calories with body mass index (BMI) of 38.0 to 38.9 in adult 08/27/2020   Tobacco use disorder 08/27/2020   Transaminitis 07/04/2020   Chronic kidney disease, stage 3a ([place]) 01/07/2020   History of iron deficiency anemia 01/07/2020   Folate deficiency 01/07/2020   Polyp of colon 01/06/2020   Prediabetes 12/17/2019   Vitamin D deficiency 12/17/2019   Class 3 severe obesity with serious comorbidity and body mass index (BMI) of 40.0 to 44.9 in adult ([place]) 12/17/2019   Absolute anemia 02/20/2014   Anemia, iron deficiency 02/20/2014   Esophageal reflux 02/20/2014   Other hyperlipidemia    GERD (gastroesophageal reflux disease)    STD (sexually transmitted disease)    Hypothyroidism    IC (interstitial cystitis)     Standley Brooking, PTA 01/11/21 1:54 PM   [facility] [address] [place], [place], [address] Phone: [phone]   Fax:  [phone]  Name: NAYELI DUBA MRN: [id number] Date of Birth: 08/02/1965

## 2021-01-14 ENCOUNTER — Encounter: Payer: Self-pay | Admitting: Physical Therapy

## 2021-01-14 ENCOUNTER — Other Ambulatory Visit: Payer: Self-pay

## 2021-01-14 ENCOUNTER — Ambulatory Visit: Payer: Commercial Managed Care - PPO | Admitting: Physical Therapy

## 2021-01-14 DIAGNOSIS — M25662 Stiffness of left knee, not elsewhere classified: Secondary | ICD-10-CM

## 2021-01-14 DIAGNOSIS — G8929 Other chronic pain: Secondary | ICD-10-CM

## 2021-01-14 DIAGNOSIS — R600 Localized edema: Secondary | ICD-10-CM

## 2021-01-14 DIAGNOSIS — M25562 Pain in left knee: Secondary | ICD-10-CM | POA: Diagnosis not present

## 2021-01-14 DIAGNOSIS — M6281 Muscle weakness (generalized): Secondary | ICD-10-CM

## 2021-01-14 NOTE — Therapy (Signed)
[facility] [place], [place], [address] Phone: [phone]   Fax:  [phone]  Physical Therapy Treatment  Patient Details  Name: Sheena [name] MRN: [id number] Date of Birth: November 21, 1965 Referring Provider (PT): Sydnee Cabal MD   Encounter Date: 01/14/2021   PT End of Session - 01/14/21 1311     Visit Number 4    Number of Visits 12    Date for PT Re-Evaluation 02/01/21    Authorization Type FOTO AT LEAST EVERY 5TH VISIT.  PROGRESS NOTE AT 10TH VISIT.    PT Start Time 1303    PT Stop Time 1356    PT Time Calculation (min) 53 min    Equipment Utilized During Treatment Other (comment)   Rollator, SPC with hurrycane attachment, SBQC   Activity Tolerance Patient tolerated treatment well    Behavior During Therapy WFL for tasks assessed/performed             Past Medical History:  Diagnosis Date   ADHD    Anemia    Anxiety    Back pain    Chronic kidney disease    Depression    GERD (gastroesophageal reflux disease)    High cholesterol    IC (interstitial cystitis)    Interstitial cystitis    Knee pain, bilateral    Obesity    Osteoarthritis    PMB (postmenopausal bleeding)    Pre-diabetes    STD (sexually transmitted disease)    HSV ll   Thyroid disease    Hypothyroid   Vitamin D deficiency     Past Surgical History:  Procedure Laterality Date   CESAREAN SECTION     x 1   colonscopy     FOOT SURGERY Right    HYSTEROSCOPY WITH D & C N/A 10/31/2019   Procedure: DILATATION AND CURETTAGE /HYSTEROSCOPY;  Surgeon: Vanessa Kick, MD;  Location: [place];  Service: Gynecology;  Laterality: N/A;   INJECTION KNEE Left 10/23/2019   versi knee gel injection and 10-30-19 to be done   [place]    There were no vitals filed for this visit.   Subjective Assessment - 01/14/21 1310     Subjective COVID-19 screen performed prior to patient entering clinic. Brought in [facility]  and [facility] for gait training but states that within her kitchen she has not used any assistive device other than counter surfing for support.    Pertinent History Right foot surgery, left ACL reconstruction, h/o back pain, GERD.    Patient Stated Goals Be more independent/go to work.    Currently in Pain? No/denies                [facility] PT Assessment - 01/14/21 0001       Assessment   Medical Diagnosis Left total knee replacement.    Referring Provider (PT) Sydnee Cabal MD    Onset Date/Surgical Date 12/31/20    Next MD Visit 01/15/2021      Precautions   Precaution Comments No ultrasound.                           [facility] Adult PT Treatment/Exercise - 01/14/21 0001       Ambulation/Gait   Ambulation/Gait Yes    Ambulation/Gait Assistance 5: Supervision    Ambulation Distance (Feet) 90 Feet    Assistive device Straight cane    Gait Pattern Step-through pattern;Decreased arm swing - left;Decreased dorsiflexion -  left;Antalgic;Decreased trunk rotation;Trunk flexed    Ambulation Surface Level;Indoor      Knee/Hip Exercises: Aerobic   Nustep L3, seat 8-5 x15 min      Knee/Hip Exercises: Standing   Heel Raises Both;20 reps    Heel Raises Limitations B toe raise x20 reps    Forward Step Up Left;2 sets;10 reps;Hand Hold: 2;Step Height: 6"    Step Down Left;2 sets;10 reps;Hand Hold: 2;Step Height: 4"    Rocker Board 2 minutes      Knee/Hip Exercises: Seated   Long Arc Quad Strengthening;Left;2 sets;10 reps;Weights    Long Arc Quad Weight 3 lbs.    Sit to Sand 15 reps;without UE support      Modalities   Modalities Vasopneumatic      Vasopneumatic   Number Minutes Vasopneumatic  10 minutes    Vasopnuematic Location  Knee    Vasopneumatic Pressure Low    Vasopneumatic Temperature  34/edema                         PT Long Term Goals - 01/11/21 1336       PT LONG TERM GOAL #1   Title Independent with a HEP.    Time 4    Period Weeks     Status On-going      PT LONG TERM GOAL #2   Title Full active left knee extension in order to normalize gait.    Time 4    Period Weeks    Status On-going      PT LONG TERM GOAL #3   Title Active knee flexion to 115 degrees+ so the patient can perform functional tasks and do so with pain not > 2-3/10.    Time 4    Period Weeks    Status On-going      PT LONG TERM GOAL #4   Title Increase left hip and knee strength to a solid 4+/5 to provide good stability for accomplishment of functional activities.    Time 4    Period Weeks    Status On-going      PT LONG TERM GOAL #5   Title Perform a reciprocating stair gait with one railing with pain not > 2-3/10.    Time 4    Period Weeks    Status On-going                   Plan - 01/14/21 1507     Clinical Impression Statement Patient presented in clinic with reports of no L knee pain. B TED hose are donned with patient bringing in canes to try with different support. Patient willing to start gait training with SPC with hurrycane attachment. Patient educated via modified 3 pt gait pattern and normal 3 pt gait pattern. Patient able to tolerate gait training very well with SPC and felt safe in trial run. Patient able to begin stair training as well but minimally limited by aquacell limited full L knee flexion that she felt she could tolerate. Normal vasopneumatic response noted following removal of the modality.    Personal Factors and Comorbidities Comorbidity 1;Comorbidity 2;Other    Comorbidities Right foot surgery, left ACL reconstruction, h/o back pain, GERD.    Examination-Activity Limitations Locomotion Level;Other    Examination-Participation Restrictions Other    Stability/Clinical Decision Making Stable/Uncomplicated    Rehab Potential Excellent    PT Frequency 3x / week    PT Duration 4 weeks    PT  Treatment/Interventions ADLs/Self Care Home Management;Cryotherapy;Electrical Stimulation;Stair training;Functional  mobility training;Gait training;Therapeutic activities;Therapeutic exercise;Neuromuscular re-education;Manual techniques;Patient/family education;Passive range of motion;Vasopneumatic Device    PT Next Visit Plan Nustep, left knee PROM, progress into TKA protocol.  Vasopneumatic.    Consulted and Agree with Plan of Care Patient             Patient will benefit from skilled therapeutic intervention in order to improve the following deficits and impairments:  Abnormal gait, Decreased activity tolerance, Decreased range of motion, Decreased strength, Increased edema, Pain  Visit Diagnosis: Chronic pain of left knee  Stiffness of left knee, not elsewhere classified  Localized edema  Muscle weakness (generalized)     Problem List Patient Active Problem List   Diagnosis Date Noted   Chronic pain of left knee 12/03/2020   Abnormal liver function 12/01/2020   Class 2 obesity due to excess calories with body mass index (BMI) of 38.0 to 38.9 in adult 08/27/2020   Tobacco use disorder 08/27/2020   Transaminitis 07/04/2020   Chronic kidney disease, stage 3a ([place]) 01/07/2020   History of iron deficiency anemia 01/07/2020   Folate deficiency 01/07/2020   Polyp of colon 01/06/2020   Prediabetes 12/17/2019   Vitamin D deficiency 12/17/2019   Class 3 severe obesity with serious comorbidity and body mass index (BMI) of 40.0 to 44.9 in adult ([place]) 12/17/2019   Absolute anemia 02/20/2014   Anemia, iron deficiency 02/20/2014   Esophageal reflux 02/20/2014   Other hyperlipidemia    GERD (gastroesophageal reflux disease)    STD (sexually transmitted disease)    Hypothyroidism    IC (interstitial cystitis)     Standley Brooking, PTA 01/14/2021, 5:35 PM  [facility] [address] [place], [place], [address] Phone: [phone]   Fax:  [phone]  Name: SYMMONE ELIZARRARAZ MRN: [id number] Date of Birth: 11/23/1965

## 2021-01-19 ENCOUNTER — Ambulatory Visit: Payer: Commercial Managed Care - PPO | Attending: Specialist | Admitting: Physical Therapy

## 2021-01-19 ENCOUNTER — Other Ambulatory Visit: Payer: Self-pay

## 2021-01-19 DIAGNOSIS — M25662 Stiffness of left knee, not elsewhere classified: Secondary | ICD-10-CM | POA: Diagnosis present

## 2021-01-19 DIAGNOSIS — R600 Localized edema: Secondary | ICD-10-CM | POA: Diagnosis present

## 2021-01-19 DIAGNOSIS — M25562 Pain in left knee: Secondary | ICD-10-CM | POA: Insufficient documentation

## 2021-01-19 DIAGNOSIS — M6281 Muscle weakness (generalized): Secondary | ICD-10-CM | POA: Diagnosis present

## 2021-01-19 DIAGNOSIS — G8929 Other chronic pain: Secondary | ICD-10-CM | POA: Diagnosis present

## 2021-01-19 NOTE — Therapy (Signed)
[facility] [place], [place], [address] Phone: [phone]   Fax:  [phone]  Physical Therapy Treatment  Patient Details  Name: Sheena [name] MRN: [id number] Date of Birth: [birth year] Referring Provider (PT): Sydnee Cabal MD   Encounter Date: 01/19/2021   PT End of Session - 01/19/21 1338     Visit Number 5    Number of Visits 12    Date for PT Re-Evaluation 02/01/21    Authorization Type FOTO AT LEAST EVERY 5TH VISIT.  PROGRESS NOTE AT 10TH VISIT.    PT Start Time 0101    PT Stop Time 0149    PT Time Calculation (min) 48 min             Past Medical History:  Diagnosis Date   ADHD    Anemia    Anxiety    Back pain    Chronic kidney disease    Depression    GERD (gastroesophageal reflux disease)    High cholesterol    IC (interstitial cystitis)    Interstitial cystitis    Knee pain, bilateral    Obesity    Osteoarthritis    PMB (postmenopausal bleeding)    Pre-diabetes    STD (sexually transmitted disease)    HSV ll   Thyroid disease    Hypothyroid   Vitamin D deficiency     Past Surgical History:  Procedure Laterality Date   CESAREAN SECTION     x 1   colonscopy     FOOT SURGERY Right    HYSTEROSCOPY WITH D & C N/A 10/31/2019   Procedure: DILATATION AND CURETTAGE /HYSTEROSCOPY;  Surgeon: Vanessa Kick, MD;  Location: [facility];  Service: Gynecology;  Laterality: N/A;   INJECTION KNEE Left 10/23/2019   versi knee gel injection and 10-30-19 to be done   [place]    There were no vitals filed for this visit.   Subjective Assessment - 01/19/21 1336     Subjective COVID-19 screen performed prior to patient entering clinic.  Got Aquacel removed last Friday.  Steristrips intact. Knee feels numb on the outside.    Pertinent History Right foot surgery, left ACL reconstruction, h/o back pain, GERD.    Patient Stated Goals Be more independent/go to work.     Currently in Pain? Yes    Pain Score 1     Pain Location Knee    Pain Orientation Left    Pain Descriptors / Indicators Aching    Pain Onset 1 to 4 weeks ago                               [facility] Adult PT Treatment/Exercise - 01/19/21 0001       Exercises   Exercises Knee/Hip      Knee/Hip Exercises: Aerobic   Recumbent Bike 7 minutes on seat 3 long hold stretching and three revolutions performed.    Nustep Level 3 x 8 minutes.      Modalities   Modalities Vasopneumatic      Vasopneumatic   Number Minutes Vasopneumatic  15 minutes    Vasopnuematic Location  --   Left knee.   Vasopneumatic Pressure Low      Manual Therapy   Manual Therapy Passive ROM    Passive ROM In supine:  PROM x 8 minutes with focus on left knee flexion with low load long duration stretchingtechnique utilized.  PT Long Term Goals - 01/11/21 1336       PT LONG TERM GOAL #1   Title Independent with a HEP.    Time 4    Period Weeks    Status On-going      PT LONG TERM GOAL #2   Title Full active left knee extension in order to normalize gait.    Time 4    Period Weeks    Status On-going      PT LONG TERM GOAL #3   Title Active knee flexion to 115 degrees+ so the patient can perform functional tasks and do so with pain not > 2-3/10.    Time 4    Period Weeks    Status On-going      PT LONG TERM GOAL #4   Title Increase left hip and knee strength to a solid 4+/5 to provide good stability for accomplishment of functional activities.    Time 4    Period Weeks    Status On-going      PT LONG TERM GOAL #5   Title Perform a reciprocating stair gait with one railing with pain not > 2-3/10.    Time 4    Period Weeks    Status On-going                   Plan - 01/19/21 1343     Clinical Impression Statement Excellent progress todday with patient progressing to recumbent bike.   She was able to make three revolutions on seat 3  today.  She has some lateral left knee numbness.  She enjoyed vasopneumatic with electrical stimulation today.    Personal Factors and Comorbidities Comorbidity 1;Comorbidity 2;Other    Comorbidities Right foot surgery, left ACL reconstruction, h/o back pain, GERD.    Examination-Activity Limitations Locomotion Level;Other    Examination-Participation Restrictions Other    Stability/Clinical Decision Making Stable/Uncomplicated    Rehab Potential Excellent    PT Frequency 3x / week    PT Duration 4 weeks    PT Treatment/Interventions ADLs/Self Care Home Management;Cryotherapy;Electrical Stimulation;Stair training;Functional mobility training;Gait training;Therapeutic activities;Therapeutic exercise;Neuromuscular re-education;Manual techniques;Patient/family education;Passive range of motion;Vasopneumatic Device    PT Next Visit Plan Nustep x 5 minutes then to recumbent bike.    Consulted and Agree with Plan of Care Patient             Patient will benefit from skilled therapeutic intervention in order to improve the following deficits and impairments:  Abnormal gait, Decreased activity tolerance, Decreased range of motion, Decreased strength, Increased edema, Pain  Visit Diagnosis: Chronic pain of left knee  Stiffness of left knee, not elsewhere classified  Localized edema  Muscle weakness (generalized)     Problem List Patient Active Problem List   Diagnosis Date Noted   Chronic pain of left knee 12/03/2020   Abnormal liver function 12/01/2020   Class 2 obesity due to excess calories with body mass index (BMI) of 38.0 to 38.9 in adult 08/27/2020   Tobacco use disorder 08/27/2020   Transaminitis 07/04/2020   Chronic kidney disease, stage 3a ([place]) 01/07/2020   History of iron deficiency anemia 01/07/2020   Folate deficiency 01/07/2020   Polyp of colon 01/06/2020   Prediabetes 12/17/2019   Vitamin D deficiency 12/17/2019   Class 3 severe obesity with serious comorbidity  and body mass index (BMI) of 40.0 to 44.9 in adult ([place]) 12/17/2019   Absolute anemia 02/20/2014   Anemia, iron deficiency 02/20/2014   Esophageal reflux 02/20/2014  Other hyperlipidemia    GERD (gastroesophageal reflux disease)    STD (sexually transmitted disease)    Hypothyroidism    IC (interstitial cystitis)     Flavio Lindroth, [place] MPT 01/19/2021, 1:49 PM  [facility] [address] [place], [place], [address] Phone: [phone]   Fax:  [phone]  Name: Sheena [name] MRN: [id number] Date of Birth: [birth year]

## 2021-01-21 ENCOUNTER — Other Ambulatory Visit: Payer: Self-pay

## 2021-01-21 ENCOUNTER — Ambulatory Visit: Payer: Commercial Managed Care - PPO | Admitting: Physical Therapy

## 2021-01-21 DIAGNOSIS — M6281 Muscle weakness (generalized): Secondary | ICD-10-CM

## 2021-01-21 DIAGNOSIS — M25562 Pain in left knee: Secondary | ICD-10-CM | POA: Diagnosis not present

## 2021-01-21 DIAGNOSIS — M25662 Stiffness of left knee, not elsewhere classified: Secondary | ICD-10-CM

## 2021-01-21 DIAGNOSIS — R600 Localized edema: Secondary | ICD-10-CM

## 2021-01-21 DIAGNOSIS — G8929 Other chronic pain: Secondary | ICD-10-CM

## 2021-01-21 NOTE — Therapy (Signed)
[facility] [place], [place], [address] Phone: [phone]   Fax:  [phone]  Physical Therapy Treatment  Patient Details  Name: Sheena [name] MRN: [id number] Date of Birth: 03/15/1966 Referring Provider (PT): Sydnee Cabal MD   Encounter Date: 01/21/2021   PT End of Session - 01/21/21 1315     Visit Number 6    Number of Visits 12    Date for PT Re-Evaluation 02/01/21    Authorization Type FOTO AT LEAST EVERY 5TH VISIT.  PROGRESS NOTE AT 10TH VISIT.    PT Start Time 0105    PT Stop Time 0159    PT Time Calculation (min) 54 min    Activity Tolerance Patient tolerated treatment well    Behavior During Therapy WFL for tasks assessed/performed             Past Medical History:  Diagnosis Date   ADHD    Anemia    Anxiety    Back pain    Chronic kidney disease    Depression    GERD (gastroesophageal reflux disease)    High cholesterol    IC (interstitial cystitis)    Interstitial cystitis    Knee pain, bilateral    Obesity    Osteoarthritis    PMB (postmenopausal bleeding)    Pre-diabetes    STD (sexually transmitted disease)    HSV ll   Thyroid disease    Hypothyroid   Vitamin D deficiency     Past Surgical History:  Procedure Laterality Date   CESAREAN SECTION     x 1   colonscopy     FOOT SURGERY Right    HYSTEROSCOPY WITH D & C N/A 10/31/2019   Procedure: DILATATION AND CURETTAGE /HYSTEROSCOPY;  Surgeon: Vanessa Kick, MD;  Location: [facility];  Service: Gynecology;  Laterality: N/A;   INJECTION KNEE Left 10/23/2019   versi knee gel injection and 10-30-19 to be done   [place]    There were no vitals filed for this visit.   Subjective Assessment - 01/21/21 1307     Subjective COVID-19 screen performed prior to patient entering clinic.  Patient arrived doing well and minimal discomfort.    Pertinent History Right foot surgery, left ACL reconstruction, h/o  back pain, GERD.    Patient Stated Goals Be more independent/go to work.    Currently in Pain? Yes    Pain Score 1     Pain Location Knee    Pain Orientation Left    Pain Descriptors / Indicators Aching    Pain Type Surgical pain    Pain Onset 1 to 4 weeks ago    Pain Frequency Intermittent    Aggravating Factors  prolong activity    Pain Relieving Factors rest                [facility] PT Assessment - 01/21/21 0001       ROM / Strength   AROM / PROM / Strength AROM;PROM      AROM   AROM Assessment Site Knee    Right/Left Knee Left    Left Knee Extension 0    Left Knee Flexion 100      PROM   PROM Assessment Site Knee    Right/Left Knee Left    Left Knee Extension 0    Left Knee Flexion 110  [facility] Adult PT Treatment/Exercise - 01/21/21 0001       Knee/Hip Exercises: Aerobic   Recumbent Bike 73min L1    Nustep L3 x58min      Knee/Hip Exercises: Standing   Terminal Knee Extension Strengthening    Forward Step Up Left;2 sets;10 reps;Hand Hold: 0;Step Height: 6"    Step Down Left;2 sets;10 reps;Hand Hold: 2;Step Height: 6"    Rocker Board 2 minutes      Vasopneumatic   Number Minutes Vasopneumatic  15 minutes    Vasopnuematic Location  Knee    Vasopneumatic Pressure Low    Vasopneumatic Temperature  34/edema      Manual Therapy   Manual Therapy Passive ROM    Manual therapy comments manual PROM for left knee flexion and ext with low load holds to improve mobility                         PT Long Term Goals - 01/21/21 1316       PT LONG TERM GOAL #1   Title Independent with a HEP.    Time 4    Period Weeks    Status On-going      PT LONG TERM GOAL #2   Title Full active left knee extension in order to normalize gait.    Baseline AROM 0 degrees 01/21/21    Time 4    Period Weeks    Status Achieved      PT LONG TERM GOAL #3   Title Active knee flexion to 115 degrees+ so the patient can perform  functional tasks and do so with pain not > 2-3/10.    Baseline AROM 110 degrees 01/21/21    Time 4    Period Weeks    Status On-going      PT LONG TERM GOAL #4   Title Increase left hip and knee strength to a solid 4+/5 to provide good stability for accomplishment of functional activities.    Time 4    Period Weeks    Status On-going      PT LONG TERM GOAL #5   Title Perform a reciprocating stair gait with one railing with pain not > 2-3/10.    Time 4    Period Weeks    Status On-going                   Plan - 01/21/21 1356     Clinical Impression Statement Patient tolerated treatment well today.Patient has improved with ROM today for flexion and ext. Patient able to progress with strengthening for left knee. Patient has mininal discomfort. Met LTG#2 today with remaining progressing.    Personal Factors and Comorbidities Comorbidity 1;Comorbidity 2;Other    Comorbidities Right foot surgery, left ACL reconstruction, h/o back pain, GERD.    Examination-Activity Limitations Locomotion Level;Other    Examination-Participation Restrictions Other    Stability/Clinical Decision Making Stable/Uncomplicated    Rehab Potential Excellent    PT Frequency 3x / week    PT Duration 4 weeks    PT Treatment/Interventions ADLs/Self Care Home Management;Cryotherapy;Electrical Stimulation;Stair training;Functional mobility training;Gait training;Therapeutic activities;Therapeutic exercise;Neuromuscular re-education;Manual techniques;Patient/family education;Passive range of motion;Vasopneumatic Device    PT Next Visit Plan cont with POC for ROM and strengthening    Consulted and Agree with Plan of Care Patient             Patient will benefit from skilled therapeutic intervention in order to improve the following deficits and  impairments:  Abnormal gait, Decreased activity tolerance, Decreased range of motion, Decreased strength, Increased edema, Pain  Visit Diagnosis: Chronic pain of  left knee  Localized edema  Stiffness of left knee, not elsewhere classified  Muscle weakness (generalized)     Problem List Patient Active Problem List   Diagnosis Date Noted   Chronic pain of left knee 12/03/2020   Abnormal liver function 12/01/2020   Class 2 obesity due to excess calories with body mass index (BMI) of 38.0 to 38.9 in adult 08/27/2020   Tobacco use disorder 08/27/2020   Transaminitis 07/04/2020   Chronic kidney disease, stage 3a ([place]) 01/07/2020   History of iron deficiency anemia 01/07/2020   Folate deficiency 01/07/2020   Polyp of colon 01/06/2020   Prediabetes 12/17/2019   Vitamin D deficiency 12/17/2019   Class 3 severe obesity with serious comorbidity and body mass index (BMI) of 40.0 to 44.9 in adult ([place]) 12/17/2019   Absolute anemia 02/20/2014   Anemia, iron deficiency 02/20/2014   Esophageal reflux 02/20/2014   Other hyperlipidemia    GERD (gastroesophageal reflux disease)    STD (sexually transmitted disease)    Hypothyroidism    IC (interstitial cystitis)     Emmily Pellegrin P, PTA 01/21/2021, 2:07 PM  [facility] [address] [place], [place], [address] Phone: [phone]   Fax:  [phone]  Name: Sheena [name] MRN: [id number] Date of Birth: [birth year]

## 2021-01-25 ENCOUNTER — Other Ambulatory Visit: Payer: Self-pay

## 2021-01-25 ENCOUNTER — Ambulatory Visit: Payer: Commercial Managed Care - PPO | Admitting: Physical Therapy

## 2021-01-25 DIAGNOSIS — M25562 Pain in left knee: Secondary | ICD-10-CM | POA: Diagnosis not present

## 2021-01-25 DIAGNOSIS — R600 Localized edema: Secondary | ICD-10-CM

## 2021-01-25 DIAGNOSIS — G8929 Other chronic pain: Secondary | ICD-10-CM

## 2021-01-25 DIAGNOSIS — M25662 Stiffness of left knee, not elsewhere classified: Secondary | ICD-10-CM

## 2021-01-25 DIAGNOSIS — M6281 Muscle weakness (generalized): Secondary | ICD-10-CM

## 2021-01-25 NOTE — Therapy (Signed)
[facility] [place], [place], [address] Phone: [phone]   Fax:  [phone]  Physical Therapy Treatment  Patient Details  Name: Sheena [name] MRN: [id number] Date of Birth: [birth year] Referring Provider (PT): Sydnee Cabal MD   Encounter Date: 01/25/2021   PT End of Session - 01/25/21 1307     Visit Number 7    Number of Visits 12    Date for PT Re-Evaluation 02/01/21    Authorization Type FOTO AT LEAST EVERY 5TH VISIT.  PROGRESS NOTE AT 10TH VISIT.    PT Start Time 0100    PT Stop Time 0150    PT Time Calculation (min) 50 min    Equipment Utilized During Treatment Other (comment)    Activity Tolerance Patient tolerated treatment well    Behavior During Therapy WFL for tasks assessed/performed             Past Medical History:  Diagnosis Date   ADHD    Anemia    Anxiety    Back pain    Chronic kidney disease    Depression    GERD (gastroesophageal reflux disease)    High cholesterol    IC (interstitial cystitis)    Interstitial cystitis    Knee pain, bilateral    Obesity    Osteoarthritis    PMB (postmenopausal bleeding)    Pre-diabetes    STD (sexually transmitted disease)    HSV ll   Thyroid disease    Hypothyroid   Vitamin D deficiency     Past Surgical History:  Procedure Laterality Date   CESAREAN SECTION     x 1   colonscopy     FOOT SURGERY Right    HYSTEROSCOPY WITH D & C N/A 10/31/2019   Procedure: DILATATION AND CURETTAGE /HYSTEROSCOPY;  Surgeon: Vanessa Kick, MD;  Location: [place];  Service: Gynecology;  Laterality: N/A;   INJECTION KNEE Left 10/23/2019   versi knee gel injection and 10-30-19 to be done   [place]    There were no vitals filed for this visit.   Subjective Assessment - 01/25/21 1308     Subjective COVID-19 screen performed prior to patient entering clinic.  'Slept Wrong.'  Feeling some pain in left distal hamstring.     Pertinent History Right foot surgery, left ACL reconstruction, h/o back pain, GERD.    Currently in Pain? Yes    Pain Score 2     Pain Location Knee    Pain Orientation Left    Pain Descriptors / Indicators Aching;Dull    Pain Type Surgical pain    Pain Onset 1 to 4 weeks ago                               [facility] Adult PT Treatment/Exercise - 01/25/21 0001       Exercises   Exercises Knee/Hip      Knee/Hip Exercises: Aerobic   Recumbent Bike Level 1 x 15 minutes.      Modalities   Modalities Vasopneumatic      Vasopneumatic   Number Minutes Vasopneumatic  15 minutes    Vasopnuematic Location  --   Left knee.   Vasopneumatic Pressure Medium      Manual Therapy   Manual Therapy Passive ROM    Passive ROM STW/M to patient's left distal/lateral hamstring and flexion stretching (total 8 minutes).  PT Long Term Goals - 01/21/21 1316       PT LONG TERM GOAL #1   Title Independent with a HEP.    Time 4    Period Weeks    Status On-going      PT LONG TERM GOAL #2   Title Full active left knee extension in order to normalize gait.    Baseline AROM 0 degrees 01/21/21    Time 4    Period Weeks    Status Achieved      PT LONG TERM GOAL #3   Title Active knee flexion to 115 degrees+ so the patient can perform functional tasks and do so with pain not > 2-3/10.    Baseline AROM 110 degrees 01/21/21    Time 4    Period Weeks    Status On-going      PT LONG TERM GOAL #4   Title Increase left hip and knee strength to a solid 4+/5 to provide good stability for accomplishment of functional activities.    Time 4    Period Weeks    Status On-going      PT LONG TERM GOAL #5   Title Perform a reciprocating stair gait with one railing with pain not > 2-3/10.    Time 4    Period Weeks    Status On-going                   Plan - 01/25/21 1330     Clinical Impression Statement The patient is making excellent  progress.  She had some left distal/lateral hamstring discomfort today possibly related to how she slept last night.  It felt better as she perfomed the recumbent bike.  Passive left knee flexion to 110 degrees today.    Personal Factors and Comorbidities Comorbidity 1;Comorbidity 2;Other    Comorbidities Right foot surgery, left ACL reconstruction, h/o back pain, GERD.    Examination-Participation Restrictions Other    Stability/Clinical Decision Making Stable/Uncomplicated    Rehab Potential Excellent    PT Frequency 3x / week    PT Duration 4 weeks    PT Treatment/Interventions ADLs/Self Care Home Management;Cryotherapy;Electrical Stimulation;Stair training;Functional mobility training;Gait training;Therapeutic activities;Therapeutic exercise;Neuromuscular re-education;Manual techniques;Patient/family education;Passive range of motion;Vasopneumatic Device    PT Next Visit Plan cont with POC for ROM and strengthening    Consulted and Agree with Plan of Care Patient             Patient will benefit from skilled therapeutic intervention in order to improve the following deficits and impairments:  Abnormal gait, Decreased activity tolerance, Decreased range of motion, Decreased strength, Increased edema, Pain  Visit Diagnosis: Chronic pain of left knee  Localized edema  Stiffness of left knee, not elsewhere classified  Muscle weakness (generalized)     Problem List Patient Active Problem List   Diagnosis Date Noted   Chronic pain of left knee 12/03/2020   Abnormal liver function 12/01/2020   Class 2 obesity due to excess calories with body mass index (BMI) of 38.0 to 38.9 in adult 08/27/2020   Tobacco use disorder 08/27/2020   Transaminitis 07/04/2020   Chronic kidney disease, stage 3a ([place]) 01/07/2020   History of iron deficiency anemia 01/07/2020   Folate deficiency 01/07/2020   Polyp of colon 01/06/2020   Prediabetes 12/17/2019   Vitamin D deficiency 12/17/2019   Class  3 severe obesity with serious comorbidity and body mass index (BMI) of 40.0 to 44.9 in adult ([place]) 12/17/2019   Absolute anemia  02/20/2014   Anemia, iron deficiency 02/20/2014   Esophageal reflux 02/20/2014   Other hyperlipidemia    GERD (gastroesophageal reflux disease)    STD (sexually transmitted disease)    Hypothyroidism    IC (interstitial cystitis)     Burdell Peed, [place] MPT 01/25/2021, 3:10 PM  [facility] [address] [place], [place], [address] Phone: [phone]   Fax:  [phone]  Name: Sheena [name] MRN: [id number] Date of Birth: [birth year]

## 2021-01-28 ENCOUNTER — Other Ambulatory Visit: Payer: Self-pay

## 2021-01-28 ENCOUNTER — Ambulatory Visit: Payer: Commercial Managed Care - PPO

## 2021-01-28 DIAGNOSIS — M25562 Pain in left knee: Secondary | ICD-10-CM

## 2021-01-28 DIAGNOSIS — R600 Localized edema: Secondary | ICD-10-CM

## 2021-01-28 DIAGNOSIS — M6281 Muscle weakness (generalized): Secondary | ICD-10-CM

## 2021-01-28 DIAGNOSIS — M25662 Stiffness of left knee, not elsewhere classified: Secondary | ICD-10-CM

## 2021-01-28 DIAGNOSIS — G8929 Other chronic pain: Secondary | ICD-10-CM

## 2021-01-28 NOTE — Therapy (Signed)
[facility] [place], [place], [address] Phone: [phone]   Fax:  [phone]  Physical Therapy Treatment  Patient Details  Name: Sheena [name] MRN: [id number] Date of Birth: Oct 19, 1965 Referring Provider (PT): Sydnee Cabal MD   Encounter Date: 01/28/2021   PT End of Session - 01/28/21 1716     Visit Number 8    Number of Visits 12    Date for PT Re-Evaluation 02/01/21    PT Start Time 12    PT Stop Time 1350    PT Time Calculation (min) 50 min    Equipment Utilized During Treatment Other (comment)   MHP whil on bike   Activity Tolerance Patient tolerated treatment well    Behavior During Therapy WFL for tasks assessed/performed             Past Medical History:  Diagnosis Date   ADHD    Anemia    Anxiety    Back pain    Chronic kidney disease    Depression    GERD (gastroesophageal reflux disease)    High cholesterol    IC (interstitial cystitis)    Interstitial cystitis    Knee pain, bilateral    Obesity    Osteoarthritis    PMB (postmenopausal bleeding)    Pre-diabetes    STD (sexually transmitted disease)    HSV ll   Thyroid disease    Hypothyroid   Vitamin D deficiency     Past Surgical History:  Procedure Laterality Date   CESAREAN SECTION     x 1   colonscopy     FOOT SURGERY Right    HYSTEROSCOPY WITH D & C N/A 10/31/2019   Procedure: DILATATION AND CURETTAGE /HYSTEROSCOPY;  Surgeon: Vanessa Kick, MD;  Location: [place];  Service: Gynecology;  Laterality: N/A;   INJECTION KNEE Left 10/23/2019   versi knee gel injection and 10-30-19 to be done   Sheena [name]    There were no vitals filed for this visit.   Subjective Assessment - 01/28/21 1310     Subjective COVID-19 screen performed prior to patient entering clinic.  Has been doing better lately with being able to sleep comfortably. She feels the greatest discomfort at the incision. She prefers  heating pad.    Pertinent History Right foot surgery, left ACL reconstruction, h/o back pain, GERD.    Patient Stated Goals Be more independent/go to work.    Currently in Pain? Yes    Pain Score 2     Pain Location Knee    Pain Orientation Left    Pain Descriptors / Indicators Aching;Dull    Pain Type Surgical pain    Pain Onset 1 to 4 weeks ago                               [facility] Adult PT Treatment/Exercise - 01/28/21 0001       Exercises   Exercises Knee/Hip      Knee/Hip Exercises: Aerobic   Recumbent Bike Level 1 x 10 minutes.      Knee/Hip Exercises: Supine   Short Arc Quad Sets AROM;Left;2 sets;10 reps    Other Supine Knee/Hip Exercises heel slides with pillowcase      Manual Therapy   Manual Therapy Passive ROM    Manual therapy comments manual PROM for left knee flexion and ext with low load holds to improve mobility  PT Long Term Goals - 01/21/21 1316       PT LONG TERM GOAL #1   Title Independent with a HEP.    Time 4    Period Weeks    Status On-going      PT LONG TERM GOAL #2   Title Full active left knee extension in order to normalize gait.    Baseline AROM 0 degrees 01/21/21    Time 4    Period Weeks    Status Achieved      PT LONG TERM GOAL #3   Title Active knee flexion to 115 degrees+ so the patient can perform functional tasks and do so with pain not > 2-3/10.    Baseline AROM 110 degrees 01/21/21    Time 4    Period Weeks    Status On-going      PT LONG TERM GOAL #4   Title Increase left hip and knee strength to a solid 4+/5 to provide good stability for accomplishment of functional activities.    Time 4    Period Weeks    Status On-going      PT LONG TERM GOAL #5   Title Perform a reciprocating stair gait with one railing with pain not > 2-3/10.    Time 4    Period Weeks    Status On-going                   Plan - 01/28/21 1717     Clinical Impression Statement  Sheena [name] presents to PT ambulating indep of a SPC. She reports stiffness in her knee at the site of the incision  observed it is healing well without concerns for infection or delayed healing. She perofomed bike warm up with moist heat pack as that helped her mobility at the suprapatellar region, refrained contact with infrapatellar region as there are continued open areas. She demonstrates excellent quad strength with SAQs but with mild buckling/limitation with TKE in midstance to toe off. Plan to continue per plan of care.    Personal Factors and Comorbidities Comorbidity 1;Comorbidity 2;Other    Comorbidities Right foot surgery, left ACL reconstruction, h/o back pain, GERD.    Examination-Activity Limitations Locomotion Level;Other    Examination-Participation Restrictions Other    Stability/Clinical Decision Making Stable/Uncomplicated    Clinical Decision Making Low    Rehab Potential Excellent    PT Frequency 3x / week    PT Duration 4 weeks    PT Treatment/Interventions ADLs/Self Care Home Management;Cryotherapy;Electrical Stimulation;Stair training;Functional mobility training;Gait training;Therapeutic activities;Therapeutic exercise;Neuromuscular re-education;Manual techniques;Patient/family education;Passive range of motion;Vasopneumatic Device    PT Next Visit Plan cont with POC for ROM and strengthening    Consulted and Agree with Plan of Care Patient             Patient will benefit from skilled therapeutic intervention in order to improve the following deficits and impairments:  Abnormal gait, Decreased activity tolerance, Decreased range of motion, Decreased strength, Increased edema, Pain  Visit Diagnosis: Chronic pain of left knee  Localized edema  Stiffness of left knee, not elsewhere classified  Muscle weakness (generalized)     Problem List Patient Active Problem List   Diagnosis Date Noted   Chronic pain of left knee 12/03/2020   Abnormal liver function  12/01/2020   Class 2 obesity due to excess calories with body mass index (BMI) of 38.0 to 38.9 in adult 08/27/2020   Tobacco use disorder 08/27/2020   Transaminitis 07/04/2020   Chronic  kidney disease, stage 3a ([place]) 01/07/2020   History of iron deficiency anemia 01/07/2020   Folate deficiency 01/07/2020   Polyp of colon 01/06/2020   Prediabetes 12/17/2019   Vitamin D deficiency 12/17/2019   Class 3 severe obesity with serious comorbidity and body mass index (BMI) of 40.0 to 44.9 in adult ([place]) 12/17/2019   Absolute anemia 02/20/2014   Anemia, iron deficiency 02/20/2014   Esophageal reflux 02/20/2014   Other hyperlipidemia    GERD (gastroesophageal reflux disease)    STD (sexually transmitted disease)    Hypothyroidism    IC (interstitial cystitis)     Sheena [name] PT, DPT 01/28/2021, [facility] [address] [place], [place], [address] Phone: [phone]   Fax:  [phone]  Name: Sheena [name] MRN: [id number] Date of Birth: 03/08/1966

## 2021-02-01 ENCOUNTER — Ambulatory Visit: Payer: Commercial Managed Care - PPO | Admitting: Physical Therapy

## 2021-02-01 DIAGNOSIS — G8929 Other chronic pain: Secondary | ICD-10-CM

## 2021-02-01 DIAGNOSIS — M25562 Pain in left knee: Secondary | ICD-10-CM | POA: Diagnosis not present

## 2021-02-01 DIAGNOSIS — M6281 Muscle weakness (generalized): Secondary | ICD-10-CM

## 2021-02-01 DIAGNOSIS — R600 Localized edema: Secondary | ICD-10-CM

## 2021-02-01 DIAGNOSIS — M25662 Stiffness of left knee, not elsewhere classified: Secondary | ICD-10-CM

## 2021-02-01 NOTE — Therapy (Signed)
[facility] [place], [place], [address] Phone: [phone]   Fax:  [phone]  Physical Therapy Treatment  Patient Details  Name: Sheena [name] MRN: [id number] Date of Birth: 1965-12-18 Referring Provider (PT): Sydnee Cabal MD   Encounter Date: 02/01/2021   PT End of Session - 02/01/21 1305     Visit Number 9    Number of Visits 12    Date for PT Re-Evaluation 02/01/21    Authorization Type FOTO 9th visit score 77  PROGRESS NOTE AT 10TH VISIT.    PT Start Time 0100    PT Stop Time 0144    PT Time Calculation (min) 44 min    Activity Tolerance Patient tolerated treatment well    Behavior During Therapy WFL for tasks assessed/performed             Past Medical History:  Diagnosis Date   ADHD    Anemia    Anxiety    Back pain    Chronic kidney disease    Depression    GERD (gastroesophageal reflux disease)    High cholesterol    IC (interstitial cystitis)    Interstitial cystitis    Knee pain, bilateral    Obesity    Osteoarthritis    PMB (postmenopausal bleeding)    Pre-diabetes    STD (sexually transmitted disease)    HSV ll   Thyroid disease    Hypothyroid   Vitamin D deficiency     Past Surgical History:  Procedure Laterality Date   CESAREAN SECTION     x 1   colonscopy     FOOT SURGERY Right    HYSTEROSCOPY WITH D & C N/A 10/31/2019   Procedure: DILATATION AND CURETTAGE /HYSTEROSCOPY;  Surgeon: Vanessa Kick, MD;  Location: [facility];  Service: Gynecology;  Laterality: N/A;   INJECTION KNEE Left 10/23/2019   versi knee gel injection and 10-30-19 to be done   [place]    There were no vitals filed for this visit.   Subjective Assessment - 02/01/21 1303     Subjective COVID-19 screen performed prior to patient entering clinic.  Patient arrived with some complaints of tightness in knee esp being up walking alot    Pertinent History Right foot surgery,  left ACL reconstruction, h/o back pain, GERD.    Patient Stated Goals Be more independent/go to work.    Currently in Pain? Yes    Pain Score 2     Pain Location Knee    Pain Orientation Left    Pain Descriptors / Indicators Discomfort;Tightness;Sore    Pain Type Surgical pain    Pain Onset More than a month ago    Pain Frequency Intermittent    Aggravating Factors  prolong walking/standing    Pain Relieving Factors at rest                [facility] PT Assessment - 02/01/21 0001       AROM   AROM Assessment Site Knee    Right/Left Knee Left    Left Knee Flexion 108      PROM   PROM Assessment Site Knee    Right/Left Knee Left    Left Knee Flexion 115                           [facility] Adult PT Treatment/Exercise - 02/01/21 0001       Knee/Hip  Exercises: Stretches   Knee: Self-Stretch to increase Flexion Left;3 reps;20 seconds      Knee/Hip Exercises: Aerobic   Recumbent Bike Level 1 x 10 minutes.      Knee/Hip Exercises: Standing   Forward Step Up Left;2 sets;10 reps;Hand Hold: 0;Step Height: 6"    Step Down Left;2 sets;10 reps;Hand Hold: 2;Step Height: 6"    Other Standing Knee Exercises lateral stepping with yellow band on bil ankles x fatigue      Vasopneumatic   Number Minutes Vasopneumatic  15 minutes    Vasopnuematic Location  Knee    Vasopneumatic Pressure Medium    Vasopneumatic Temperature  34/edema      Manual Therapy   Manual Therapy Passive ROM    Passive ROM manual PROM for  left kne flexion with low load holds to improve mobility.                    PT Education - 02/01/21 1339     Education Details HEP    Person(s) Educated Patient    Methods Explanation;Demonstration;Handout    Comprehension Verbalized understanding;Returned demonstration                 PT Long Term Goals - 02/01/21 1306       PT LONG TERM GOAL #1   Title Independent with a HEP.    Baseline 02/01/21    Time 4    Period Weeks     Status Achieved      PT LONG TERM GOAL #2   Title Full active left knee extension in order to normalize gait.    Baseline AROM 0 degrees 01/21/21    Time 4    Period Weeks    Status Achieved      PT LONG TERM GOAL #3   Title Active knee flexion to 115 degrees+ so the patient can perform functional tasks and do so with pain not > 2-3/10.    Baseline AROM 108 degrees 02/01/21    Time 4    Period Weeks    Status On-going      PT LONG TERM GOAL #4   Title Increase left hip and knee strength to a solid 4+/5 to provide good stability for accomplishment of functional activities.    Time 4    Period Weeks    Status On-going      PT LONG TERM GOAL #5   Title Perform a reciprocating stair gait with one railing with pain not > 2-3/10.    Time 4    Period Weeks    Status On-going                   Plan - 02/01/21 1349     Clinical Impression Statement Patient tolerated treatment well today. Patient continues to progress with all exercises and ROM. Patient reported increased tightness with prolong walking. HEP provided for home progression. Patient going to MD next week for follow up.Met LTG #1 with others progressing.    Personal Factors and Comorbidities Comorbidity 1;Comorbidity 2;Other    Comorbidities Right foot surgery, left ACL reconstruction, h/o back pain, GERD.    Examination-Activity Limitations Locomotion Level;Other    Examination-Participation Restrictions Other    Stability/Clinical Decision Making Stable/Uncomplicated    Rehab Potential Excellent    PT Frequency 3x / week    PT Duration 4 weeks    PT Treatment/Interventions ADLs/Self Care Home Management;Cryotherapy;Electrical Stimulation;Stair training;Functional mobility training;Gait training;Therapeutic activities;Therapeutic exercise;Neuromuscular re-education;Manual techniques;Patient/family education;Passive range  of motion;Vasopneumatic Device    PT Next Visit Plan cont with POC for ROM and strengthening MD  note to dr. Theda Sers next visit/ 10th visit progress next visit    Consulted and Agree with Plan of Care Patient             Patient will benefit from skilled therapeutic intervention in order to improve the following deficits and impairments:  Abnormal gait, Decreased activity tolerance, Decreased range of motion, Decreased strength, Increased edema, Pain  Visit Diagnosis: Chronic pain of left knee  Localized edema  Stiffness of left knee, not elsewhere classified  Muscle weakness (generalized)     Problem List Patient Active Problem List   Diagnosis Date Noted   Chronic pain of left knee 12/03/2020   Abnormal liver function 12/01/2020   Class 2 obesity due to excess calories with body mass index (BMI) of 38.0 to 38.9 in adult 08/27/2020   Tobacco use disorder 08/27/2020   Transaminitis 07/04/2020   Chronic kidney disease, stage 3a ([place]) 01/07/2020   History of iron deficiency anemia 01/07/2020   Folate deficiency 01/07/2020   Polyp of colon 01/06/2020   Prediabetes 12/17/2019   Vitamin D deficiency 12/17/2019   Class 3 severe obesity with serious comorbidity and body mass index (BMI) of 40.0 to 44.9 in adult ([place]) 12/17/2019   Absolute anemia 02/20/2014   Anemia, iron deficiency 02/20/2014   Esophageal reflux 02/20/2014   Other hyperlipidemia    GERD (gastroesophageal reflux disease)    STD (sexually transmitted disease)    Hypothyroidism    IC (interstitial cystitis)     Annais Crafts P, PTA 02/01/2021, 1:52 PM  [facility] [address] [place], [place], [address] Phone: [phone]   Fax:  [phone]  Name: KANDY TOWERY MRN: [id number] Date of Birth: June 09, 1966

## 2021-02-01 NOTE — Patient Instructions (Signed)
Strengthening: Hip Abduction (Side-Lying)  Strengthening: Straight Leg Raise (Phase 1)  Repeat _10___ times per set. Do __2__ sets per session. Do __2__ sessions per day.   Bridging   Slowly raise buttocks from floor, keeping stomach tight. Repeat _10___ times per set. Do __2__ sets per session. Do __2__ sessions per day.   Straight Leg Raise   Tighten stomach and slowly raise locked right leg __4__ inches from floor. Repeat __10-30__ times per set. Do __2__ sets per session. Do __2__ sessions per day.    Lower Body: Toe Rise   Standing, place feet apart. Hold arms out for balance or use support. Rise up on toes. Hold _3___ seconds, then lower. Repeat immediately. Repeat __10__ times. Do __2__ sessions per day.   Half Squat to Chair   Stand with feet shoulder width apart. Push buttocks backward and lower slowly, sitting in chair lightly and returning to standing position. Complete _2_ sets of 10_ repetitions. Perform __2-3_ sessions per day.     Hip abduction   While sitting with good posture, tie theraband around knees and pull apart. Slowly resume starting position. x30 1-2 x day  Knee Extension: Resisted (Sitting)    With band looped around right ankle and under other foot, straighten leg with ankle loop. Keep other leg bent to increase resistance. Repeat __20__ times per set. Do __1-2__ sets per session. Do __1-2__ sessions per day.   Knee Flexion: Resisted (Sitting)    Sit with band under left foot and looped around ankle of supported leg. Pull unsupported leg back. Repeat _20___ times per set. Do __1-2__ sets per session. Do __1-2__ sessions per day.    SEATED MARCHING - ELASTIC BAND  Start by sitting in a chair with an elastic band wrapped around your lower thighs.  Next, move a knee upward, set it back down and then alternate to the other side. Perform 10-20 1-2x daily

## 2021-02-02 ENCOUNTER — Ambulatory Visit (INDEPENDENT_AMBULATORY_CARE_PROVIDER_SITE_OTHER): Payer: Commercial Managed Care - PPO | Admitting: Adult Health

## 2021-02-04 ENCOUNTER — Other Ambulatory Visit: Payer: Self-pay

## 2021-02-04 ENCOUNTER — Ambulatory Visit: Payer: Commercial Managed Care - PPO | Admitting: Physical Therapy

## 2021-02-04 DIAGNOSIS — G8929 Other chronic pain: Secondary | ICD-10-CM

## 2021-02-04 DIAGNOSIS — M25662 Stiffness of left knee, not elsewhere classified: Secondary | ICD-10-CM

## 2021-02-04 DIAGNOSIS — R600 Localized edema: Secondary | ICD-10-CM

## 2021-02-04 DIAGNOSIS — M25562 Pain in left knee: Secondary | ICD-10-CM | POA: Diagnosis not present

## 2021-02-04 DIAGNOSIS — M6281 Muscle weakness (generalized): Secondary | ICD-10-CM

## 2021-02-04 NOTE — Therapy (Signed)
[facility] [place], [place], [address] Phone: [phone]   Fax:  [phone]  Physical Therapy Treatment  Patient Details  Name: Sheena [name] MRN: [id number] Date of Birth: 1965-11-29 Referring Provider (PT): Sydnee Cabal MD   Encounter Date: 02/04/2021   PT End of Session - 02/04/21 1307     Visit Number 10    Number of Visits 12    Date for PT Re-Evaluation 02/01/21    Authorization Type FOTO 9th visit score 54  PROGRESS NOTE AT 10TH VISIT.    PT Start Time 0103    PT Stop Time 0143    PT Time Calculation (min) 40 min    Activity Tolerance Patient tolerated treatment well    Behavior During Therapy WFL for tasks assessed/performed             Past Medical History:  Diagnosis Date   ADHD    Anemia    Anxiety    Back pain    Chronic kidney disease    Depression    GERD (gastroesophageal reflux disease)    High cholesterol    IC (interstitial cystitis)    Interstitial cystitis    Knee pain, bilateral    Obesity    Osteoarthritis    PMB (postmenopausal bleeding)    Pre-diabetes    STD (sexually transmitted disease)    HSV ll   Thyroid disease    Hypothyroid   Vitamin D deficiency     Past Surgical History:  Procedure Laterality Date   CESAREAN SECTION     x 1   colonscopy     FOOT SURGERY Right    HYSTEROSCOPY WITH D & C N/A 10/31/2019   Procedure: DILATATION AND CURETTAGE /HYSTEROSCOPY;  Surgeon: Vanessa Kick, MD;  Location: [facility];  Service: Gynecology;  Laterality: N/A;   INJECTION KNEE Left 10/23/2019   versi knee gel injection and 10-30-19 to be done   [place]    There were no vitals filed for this visit.   Subjective Assessment - 02/04/21 1305     Subjective COVID-19 screen performed prior to patient entering clinic.  Patient reported some ongoing tightness than pain yet doing well today.    Pertinent History Right foot surgery, left ACL  reconstruction, h/o back pain, GERD.    Patient Stated Goals Be more independent/go to work.    Currently in Pain? Yes    Pain Score 2     Pain Location Knee    Pain Orientation Left    Pain Descriptors / Indicators Discomfort;Tightness    Pain Type Surgical pain    Pain Onset More than a month ago    Pain Frequency Intermittent    Aggravating Factors  prolong walking    Pain Relieving Factors at rest                [facility] PT Assessment - 02/04/21 0001       AROM   AROM Assessment Site Knee    Right/Left Knee Left    Left Knee Extension 0    Left Knee Flexion 110      PROM   PROM Assessment Site Knee    Right/Left Knee Left    Left Knee Extension 0    Left Knee Flexion 115                           [facility] Adult PT  Treatment/Exercise - 02/04/21 0001       Knee/Hip Exercises: Aerobic   Recumbent Bike Level 1 x 10 minutes.      Knee/Hip Exercises: Standing   Forward Step Up Left;2 sets;10 reps;Hand Hold: 0;Step Height: 6"    Step Down Left;2 sets;10 reps;Hand Hold: 2;Step Height: 6"    Rocker Board 2 minutes    Other Standing Knee Exercises lateral stepping with yellow band on bil ankles x fatigue      Vasopneumatic   Number Minutes Vasopneumatic  15 minutes    Vasopnuematic Location  Knee    Vasopneumatic Pressure Medium    Vasopneumatic Temperature  34/edema      Manual Therapy   Manual Therapy Passive ROM    Manual therapy comments manual PROM for left knee flexion with low load holds to improve mobility                         PT Long Term Goals - 02/04/21 1331       PT LONG TERM GOAL #1   Title Independent with a HEP.    Baseline 02/01/21    Time 4    Period Weeks    Status Achieved      PT LONG TERM GOAL #2   Title Full active left knee extension in order to normalize gait.    Baseline AROM 0 degrees 01/21/21    Time 4    Period Weeks    Status Achieved      PT LONG TERM GOAL #3   Title Active knee flexion to  115 degrees+ so the patient can perform functional tasks and do so with pain not > 2-3/10.    Baseline AROM 110 degrees 02/04/21    Time 4    Period Weeks    Status On-going      PT LONG TERM GOAL #4   Title Increase left hip and knee strength to a solid 4+/5 to provide good stability for accomplishment of functional activities.    Time 4    Period Weeks    Status On-going      PT LONG TERM GOAL #5   Title Perform a reciprocating stair gait with one railing with pain not > 2-3/10.    Time 4    Period Weeks    Status On-going                   Plan - 02/04/21 1331     Clinical Impression Statement Patient tolerated treatment well today. Patient continues to progress with ROM in right knee and progresing with steps this week. Patient has more tightness than pain in knee and feels overall improvement. Patient doing well with HEP and stretches. Goals progressing this week. MD F/U next monday.    Personal Factors and Comorbidities Comorbidity 1;Comorbidity 2;Other    Comorbidities Right foot surgery, left ACL reconstruction, h/o back pain, GERD.    Examination-Activity Limitations Locomotion Level;Other    Examination-Participation Restrictions Other    Stability/Clinical Decision Making Stable/Uncomplicated    Rehab Potential Excellent    PT Frequency 3x / week    PT Duration 4 weeks    PT Treatment/Interventions ADLs/Self Care Home Management;Cryotherapy;Electrical Stimulation;Stair training;Functional mobility training;Gait training;Therapeutic activities;Therapeutic exercise;Neuromuscular re-education;Manual techniques;Patient/family education;Passive range of motion;Vasopneumatic Device    PT Next Visit Plan cont with POC for ROM and strengthening (weight machines)  MD note to dr. Theda Sers F/U 02/08/21    Consulted and Agree with  Plan of Care Patient             Patient will benefit from skilled therapeutic intervention in order to improve the following deficits and  impairments:  Abnormal gait, Decreased activity tolerance, Decreased range of motion, Decreased strength, Increased edema, Pain  Visit Diagnosis: Chronic pain of left knee  Localized edema  Stiffness of left knee, not elsewhere classified  Muscle weakness (generalized)     Problem List Patient Active Problem List   Diagnosis Date Noted   Chronic pain of left knee 12/03/2020   Abnormal liver function 12/01/2020   Class 2 obesity due to excess calories with body mass index (BMI) of 38.0 to 38.9 in adult 08/27/2020   Tobacco use disorder 08/27/2020   Transaminitis 07/04/2020   Chronic kidney disease, stage 3a ([place]) 01/07/2020   History of iron deficiency anemia 01/07/2020   Folate deficiency 01/07/2020   Polyp of colon 01/06/2020   Prediabetes 12/17/2019   Vitamin D deficiency 12/17/2019   Class 3 severe obesity with serious comorbidity and body mass index (BMI) of 40.0 to 44.9 in adult ([place]) 12/17/2019   Absolute anemia 02/20/2014   Anemia, iron deficiency 02/20/2014   Esophageal reflux 02/20/2014   Other hyperlipidemia    GERD (gastroesophageal reflux disease)    STD (sexually transmitted disease)    Hypothyroidism    IC (interstitial cystitis)     Ladean Raya, PTA 02/04/21 1:43 PM   [facility] [address] [place], [place], [address] Phone: [phone]   Fax:  [phone]  Name: KARLYNN FURROW MRN: [id number] Date of Birth: [birth year]  Progress Note Reporting Period 01/04/21 to 02/04/21  See note below for Objective Data and Assessment of Progress/Goals.  Excellent progress with LTG's #1 and #2 met and left knee active flexion to 110 degrees.    Mali Applegate MPT

## 2021-02-09 ENCOUNTER — Other Ambulatory Visit: Payer: Self-pay

## 2021-02-09 ENCOUNTER — Ambulatory Visit: Payer: Commercial Managed Care - PPO | Admitting: Physical Therapy

## 2021-02-09 ENCOUNTER — Encounter: Payer: Commercial Managed Care - PPO | Admitting: Gastroenterology

## 2021-02-09 DIAGNOSIS — R600 Localized edema: Secondary | ICD-10-CM

## 2021-02-09 DIAGNOSIS — M6281 Muscle weakness (generalized): Secondary | ICD-10-CM

## 2021-02-09 DIAGNOSIS — G8929 Other chronic pain: Secondary | ICD-10-CM

## 2021-02-09 DIAGNOSIS — M25662 Stiffness of left knee, not elsewhere classified: Secondary | ICD-10-CM

## 2021-02-09 DIAGNOSIS — M25562 Pain in left knee: Secondary | ICD-10-CM | POA: Diagnosis not present

## 2021-02-09 NOTE — Therapy (Signed)
[facility] [place], [place], [address] Phone: [phone]   Fax:  [phone]  Physical Therapy Treatment  Patient Details  Name: Sheena [name] MRN: [id number] Date of Birth: 12-Oct-1965 Referring Provider (PT): Sydnee Cabal MD   Encounter Date: 02/09/2021   PT End of Session - 02/09/21 1313     Visit Number 11    Number of Visits 14    Date for PT Re-Evaluation 02/15/21    Authorization Type FOTO 9th visit score 33  PROGRESS NOTE AT 10TH VISIT.    PT Start Time 0102    PT Stop Time 0151    PT Time Calculation (min) 49 min    Activity Tolerance Patient tolerated treatment well    Behavior During Therapy WFL for tasks assessed/performed             Past Medical History:  Diagnosis Date   ADHD    Anemia    Anxiety    Back pain    Chronic kidney disease    Depression    GERD (gastroesophageal reflux disease)    High cholesterol    IC (interstitial cystitis)    Interstitial cystitis    Knee pain, bilateral    Obesity    Osteoarthritis    PMB (postmenopausal bleeding)    Pre-diabetes    STD (sexually transmitted disease)    HSV ll   Thyroid disease    Hypothyroid   Vitamin D deficiency     Past Surgical History:  Procedure Laterality Date   CESAREAN SECTION     x 1   colonscopy     FOOT SURGERY Right    HYSTEROSCOPY WITH D & C N/A 10/31/2019   Procedure: DILATATION AND CURETTAGE /HYSTEROSCOPY;  Surgeon: Vanessa Kick, MD;  Location: [facility];  Service: Gynecology;  Laterality: N/A;   INJECTION KNEE Left 10/23/2019   versi knee gel injection and 10-30-19 to be done   [place]    There were no vitals filed for this visit.   Subjective Assessment - 02/09/21 1311     Subjective COVID-19 screen performed prior to patient entering clinic.  Knee feel sstiff sometimes.    Pertinent History Right foot surgery, left ACL reconstruction, h/o back pain, GERD.     Patient Stated Goals Be more independent/go to work.    Currently in Pain? Yes    Pain Score 2     Pain Location Knee    Pain Orientation Left    Pain Descriptors / Indicators Discomfort;Tightness    Pain Type Surgical pain    Pain Onset More than a month ago                               [facility] Adult PT Treatment/Exercise - 02/09/21 0001       Exercises   Exercises Knee/Hip      Knee/Hip Exercises: Aerobic   Recumbent Bike Level 2 x 15 minutes progressing to seat 1.      Knee/Hip Exercises: Machines for Strengthening   Cybex Knee Extension 10# x 3 minutes    Cybex Knee Flexion 30# x 3 minutes.    Cybex Leg Press 2 plates x 3 minutes.      Modalities   Modalities Vasopneumatic      Vasopneumatic   Number Minutes Vasopneumatic  15 minutes    Vasopnuematic Location  --   Left knee.  Vasopneumatic Pressure Low                         PT Long Term Goals - 02/04/21 1331       PT LONG TERM GOAL #1   Title Independent with a HEP.    Baseline 02/01/21    Time 4    Period Weeks    Status Achieved      PT LONG TERM GOAL #2   Title Full active left knee extension in order to normalize gait.    Baseline AROM 0 degrees 01/21/21    Time 4    Period Weeks    Status Achieved      PT LONG TERM GOAL #3   Title Active knee flexion to 115 degrees+ so the patient can perform functional tasks and do so with pain not > 2-3/10.    Baseline AROM 110 degrees 02/04/21    Time 4    Period Weeks    Status On-going      PT LONG TERM GOAL #4   Title Increase left hip and knee strength to a solid 4+/5 to provide good stability for accomplishment of functional activities.    Time 4    Period Weeks    Status On-going      PT LONG TERM GOAL #5   Title Perform a reciprocating stair gait with one railing with pain not > 2-3/10.    Time 4    Period Weeks    Status On-going                   Plan - 02/09/21 1341     Clinical Impression  Statement Patient did a great job today with the addition of weight machines today stating her knee felt good following these exercises.    Personal Factors and Comorbidities Comorbidity 1;Comorbidity 2;Other    Comorbidities Right foot surgery, left ACL reconstruction, h/o back pain, GERD.    Examination-Activity Limitations Locomotion Level;Other    Examination-Participation Restrictions Other    Stability/Clinical Decision Making Stable/Uncomplicated    Rehab Potential Excellent    PT Frequency 3x / week    PT Duration 4 weeks    PT Treatment/Interventions ADLs/Self Care Home Management;Cryotherapy;Electrical Stimulation;Stair training;Functional mobility training;Gait training;Therapeutic activities;Therapeutic exercise;Neuromuscular re-education;Manual techniques;Patient/family education;Passive range of motion;Vasopneumatic Device    Consulted and Agree with Plan of Care Patient             Patient will benefit from skilled therapeutic intervention in order to improve the following deficits and impairments:  Abnormal gait, Decreased activity tolerance, Decreased range of motion, Decreased strength, Increased edema, Pain  Visit Diagnosis: Chronic pain of left knee - Plan: PT plan of care cert/re-cert  Localized edema - Plan: PT plan of care cert/re-cert  Stiffness of left knee, not elsewhere classified - Plan: PT plan of care cert/re-cert  Muscle weakness (generalized) - Plan: PT plan of care cert/re-cert     Problem List Patient Active Problem List   Diagnosis Date Noted   Chronic pain of left knee 12/03/2020   Abnormal liver function 12/01/2020   Class 2 obesity due to excess calories with body mass index (BMI) of 38.0 to 38.9 in adult 08/27/2020   Tobacco use disorder 08/27/2020   Transaminitis 07/04/2020   Chronic kidney disease, stage 3a ([place]) 01/07/2020   History of iron deficiency anemia 01/07/2020   Folate deficiency 01/07/2020   Polyp of colon 01/06/2020    Prediabetes 12/17/2019  Vitamin D deficiency 12/17/2019   Class 3 severe obesity with serious comorbidity and body mass index (BMI) of 40.0 to 44.9 in adult ([place]) 12/17/2019   Absolute anemia 02/20/2014   Anemia, iron deficiency 02/20/2014   Esophageal reflux 02/20/2014   Other hyperlipidemia    GERD (gastroesophageal reflux disease)    STD (sexually transmitted disease)    Hypothyroidism    IC (interstitial cystitis)     Sheena [name], [place] MPT 02/09/2021, 1:51 PM  [facility] [address] [place], [place], [address] Phone: [phone]   Fax:  [phone]  Name: AERILYNN GABB MRN: [id number] Date of Birth: 06/29/1965

## 2021-02-11 ENCOUNTER — Ambulatory Visit: Payer: Commercial Managed Care - PPO | Admitting: Physical Therapy

## 2021-02-11 ENCOUNTER — Other Ambulatory Visit: Payer: Self-pay

## 2021-02-11 DIAGNOSIS — M25662 Stiffness of left knee, not elsewhere classified: Secondary | ICD-10-CM

## 2021-02-11 DIAGNOSIS — M6281 Muscle weakness (generalized): Secondary | ICD-10-CM

## 2021-02-11 DIAGNOSIS — G8929 Other chronic pain: Secondary | ICD-10-CM

## 2021-02-11 DIAGNOSIS — M25562 Pain in left knee: Secondary | ICD-10-CM | POA: Diagnosis not present

## 2021-02-11 DIAGNOSIS — R600 Localized edema: Secondary | ICD-10-CM

## 2021-02-11 NOTE — Therapy (Addendum)
[facility] [place], [place], [address] Phone: [phone]   Fax:  [phone]  Physical Therapy Treatment  Patient Details  Name: Sheena [name] MRN: [id number] Date of Birth: [birth year] Referring Provider (PT): Sydnee Cabal MD   Encounter Date: 02/11/2021   PT End of Session - 02/11/21 1358     Visit Number 12    Number of Visits 14    Date for PT Re-Evaluation 02/15/21    PT Start Time 0105    PT Stop Time 0157    PT Time Calculation (min) 52 min    Activity Tolerance Patient tolerated treatment well    Behavior During Therapy WFL for tasks assessed/performed             Past Medical History:  Diagnosis Date   ADHD    Anemia    Anxiety    Back pain    Chronic kidney disease    Depression    GERD (gastroesophageal reflux disease)    High cholesterol    IC (interstitial cystitis)    Interstitial cystitis    Knee pain, bilateral    Obesity    Osteoarthritis    PMB (postmenopausal bleeding)    Pre-diabetes    STD (sexually transmitted disease)    HSV ll   Thyroid disease    Hypothyroid   Vitamin D deficiency     Past Surgical History:  Procedure Laterality Date   CESAREAN SECTION     x 1   colonscopy     FOOT SURGERY Right    HYSTEROSCOPY WITH D & C N/A 10/31/2019   Procedure: DILATATION AND CURETTAGE /HYSTEROSCOPY;  Surgeon: Vanessa Kick, MD;  Location: [facility];  Service: Gynecology;  Laterality: N/A;   INJECTION KNEE Left 10/23/2019   versi knee gel injection and 10-30-19 to be done   [place]    There were no vitals filed for this visit.   Subjective Assessment - 02/11/21 1344     Subjective COVID-19 screen performed prior to patient entering clinic.  The patient felt great after last treatment.  She states she will be returning to work next week.  She may do another visit or two.    Pertinent History Right foot surgery, left ACL reconstruction, h/o  back pain, GERD.    Patient Stated Goals Be more independent/go to work.    Currently in Pain? Yes    Pain Score 1     Pain Location Knee    Pain Orientation Left    Pain Onset More than a month ago                [facility] PT Assessment - 02/11/21 0001       AROM   AROM Assessment Site Knee    Right/Left Knee Left    Left Knee Extension 0    Left Knee Flexion 115      PROM   PROM Assessment Site Knee    Right/Left Knee Left    Left Knee Extension 0    Left Knee Flexion 120                           [facility] Adult PT Treatment/Exercise - 02/11/21 0001       Exercises   Exercises Knee/Hip      Knee/Hip Exercises: Aerobic   Recumbent Bike 15 minutes progressing to seat 1.  Knee/Hip Exercises: Machines for Strengthening   Cybex Knee Extension 10# x 3 minutes    Cybex Knee Flexion 30# x 3 minutes.    Cybex Leg Press 2 plates x 3 minutes.      Vasopneumatic   Number Minutes Vasopneumatic  15 minutes    Vasopnuematic Location  --   Left knee.   Vasopneumatic Pressure Low                         PT Long Term Goals - 02/04/21 1331       PT LONG TERM GOAL #1   Title Independent with a HEP.    Baseline 02/01/21    Time 4    Period Weeks    Status Achieved      PT LONG TERM GOAL #2   Title Full active left knee extension in order to normalize gait.    Baseline AROM 0 degrees 01/21/21    Time 4    Period Weeks    Status Achieved      PT LONG TERM GOAL #3   Title Active knee flexion to 115 degrees+ so the patient can perform functional tasks and do so with pain not > 2-3/10.    Baseline AROM 110 degrees 02/04/21    Time 4    Period Weeks    Status On-going      PT LONG TERM GOAL #4   Title Increase left hip and knee strength to a solid 4+/5 to provide good stability for accomplishment of functional activities.    Time 4    Period Weeks    Status On-going      PT LONG TERM GOAL #5   Title Perform a reciprocating stair gait  with one railing with pain not > 2-3/10.    Time 4    Period Weeks    Status On-going                   Plan - 02/11/21 1405     Clinical Impression Statement The patient has done and excellent job.  She achieved active left knee flexion to 115 degrees and passive to 120 degrees.  She may complete two more visits after work which she starts back to next week.    Personal Factors and Comorbidities Comorbidity 1;Comorbidity 2;Other    Comorbidities Right foot surgery, left ACL reconstruction, h/o back pain, GERD.    Examination-Activity Limitations Locomotion Level;Other    Examination-Participation Restrictions Other    Stability/Clinical Decision Making Stable/Uncomplicated    Rehab Potential Excellent    PT Frequency 3x / week    PT Duration 4 weeks    PT Treatment/Interventions ADLs/Self Care Home Management;Cryotherapy;Electrical Stimulation;Stair training;Functional mobility training;Gait training;Therapeutic activities;Therapeutic exercise;Neuromuscular re-education;Manual techniques;Patient/family education;Passive range of motion;Vasopneumatic Device    PT Next Visit Plan cont with POC for ROM and strengthening (weight machines).    Consulted and Agree with Plan of Care Patient             Patient will benefit from skilled therapeutic intervention in order to improve the following deficits and impairments:  Abnormal gait, Decreased activity tolerance, Decreased range of motion, Decreased strength, Increased edema, Pain  Visit Diagnosis: Chronic pain of left knee  Localized edema  Stiffness of left knee, not elsewhere classified  Muscle weakness (generalized)     Problem List Patient Active Problem List   Diagnosis Date Noted   Chronic pain of left knee 12/03/2020   Abnormal  liver function 12/01/2020   Class 2 obesity due to excess calories with body mass index (BMI) of 38.0 to 38.9 in adult 08/27/2020   Tobacco use disorder 08/27/2020   Transaminitis  07/04/2020   Chronic kidney disease, stage 3a ([place]) 01/07/2020   History of iron deficiency anemia 01/07/2020   Folate deficiency 01/07/2020   Polyp of colon 01/06/2020   Prediabetes 12/17/2019   Vitamin D deficiency 12/17/2019   Class 3 severe obesity with serious comorbidity and body mass index (BMI) of 40.0 to 44.9 in adult ([place]) 12/17/2019   Absolute anemia 02/20/2014   Anemia, iron deficiency 02/20/2014   Esophageal reflux 02/20/2014   Other hyperlipidemia    GERD (gastroesophageal reflux disease)    STD (sexually transmitted disease)    Hypothyroidism    IC (interstitial cystitis)    PHYSICAL THERAPY DISCHARGE SUMMARY  Visits from Start of Care: 12.  Current functional level related to goals / functional outcomes: See above.   Remaining deficits: See goal section.   Education / Equipment: HEP.   Patient agrees to discharge. Patient goals were partially met. Patient is being discharged due to being pleased with the current functional level.  Sheena [name], [place] MPT 02/11/2021, 2:08 PM  [facility] [address] [place], [place], [address] Phone: [phone]   Fax:  [phone]  Name: Sheena [name] MRN: [id number] Date of Birth: 1965-09-21

## 2021-02-16 ENCOUNTER — Encounter (INDEPENDENT_AMBULATORY_CARE_PROVIDER_SITE_OTHER): Payer: Self-pay | Admitting: Adult Health

## 2021-02-16 ENCOUNTER — Ambulatory Visit (INDEPENDENT_AMBULATORY_CARE_PROVIDER_SITE_OTHER): Payer: Commercial Managed Care - PPO | Admitting: Adult Health

## 2021-02-16 ENCOUNTER — Other Ambulatory Visit: Payer: Self-pay

## 2021-02-16 VITALS — BP 110/76 | HR 87 | Temp 98.0°F | Ht 62.0 in | Wt 216.0 lb

## 2021-02-16 DIAGNOSIS — Z9189 Other specified personal risk factors, not elsewhere classified: Secondary | ICD-10-CM | POA: Diagnosis not present

## 2021-02-16 DIAGNOSIS — Z6838 Body mass index (BMI) 38.0-38.9, adult: Secondary | ICD-10-CM

## 2021-02-16 DIAGNOSIS — R7303 Prediabetes: Secondary | ICD-10-CM

## 2021-02-16 DIAGNOSIS — E6609 Other obesity due to excess calories: Secondary | ICD-10-CM | POA: Diagnosis not present

## 2021-02-16 MED ORDER — METFORMIN HCL 500 MG PO TABS
500.0000 mg | ORAL_TABLET | Freq: Every morning | ORAL | 0 refills | Status: DC
Start: 2021-02-16 — End: 2021-03-22
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
## 2021-03-17 IMAGING — MG MM DIGITAL DIAGNOSTIC UNILAT*R* W/ TOMO W/ CAD
4 series · 4 of 12 positions shown · non-contrast
Comparison: Previous exam(s).

CLINICAL DATA: Recall from screening mammography with
tomosynthesis, possible new mass involving the LOWER OUTER
subareolar RIGHT breast.

EXAM:
DIGITAL DIAGNOSTIC RIGHT MAMMOGRAM
ULTRASOUND RIGHT BREAST

[R MLO synth-2D]
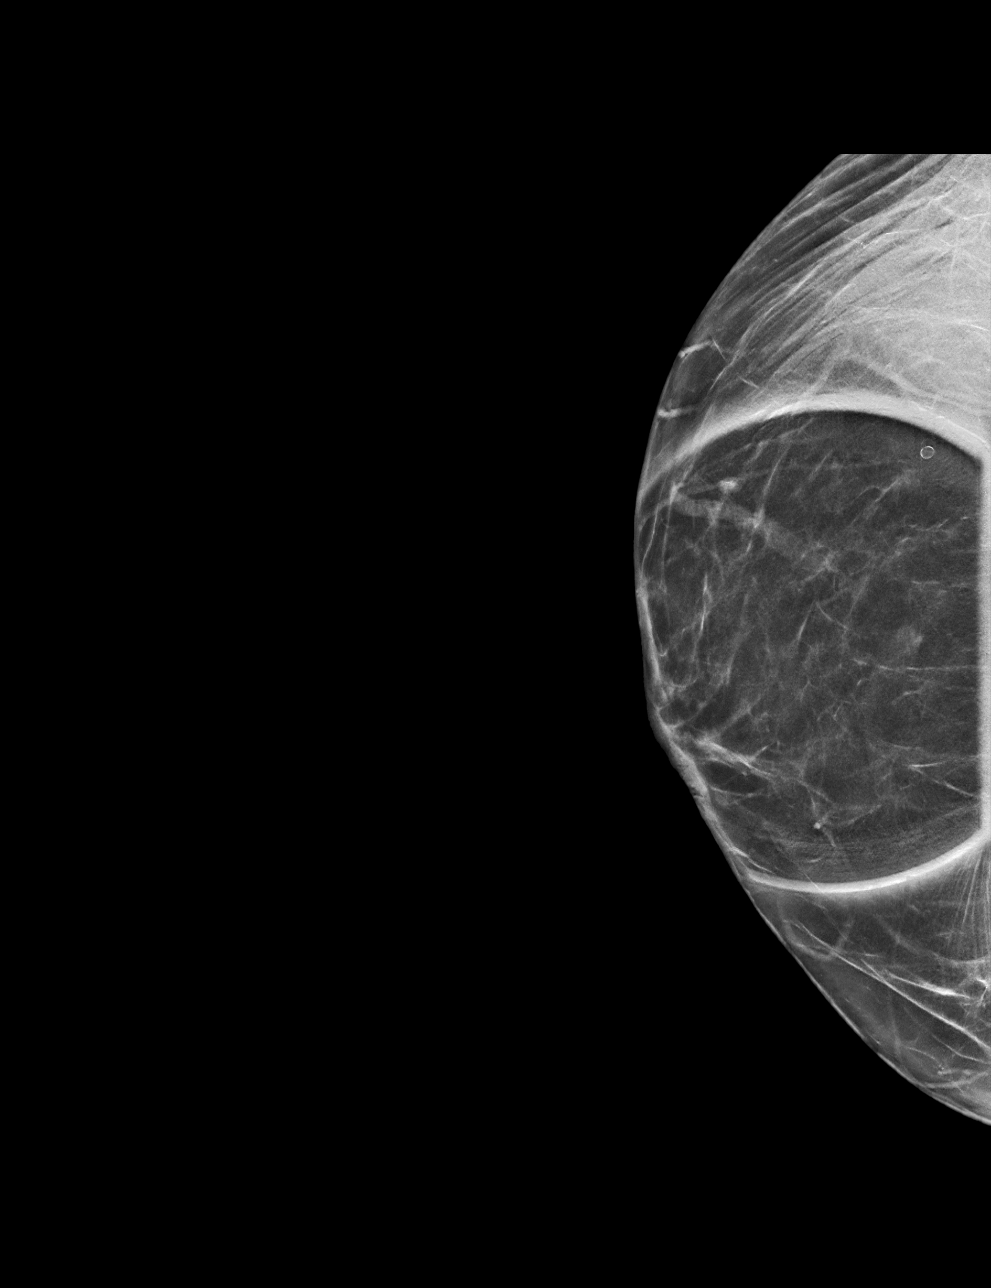

[R CC synth-2D]
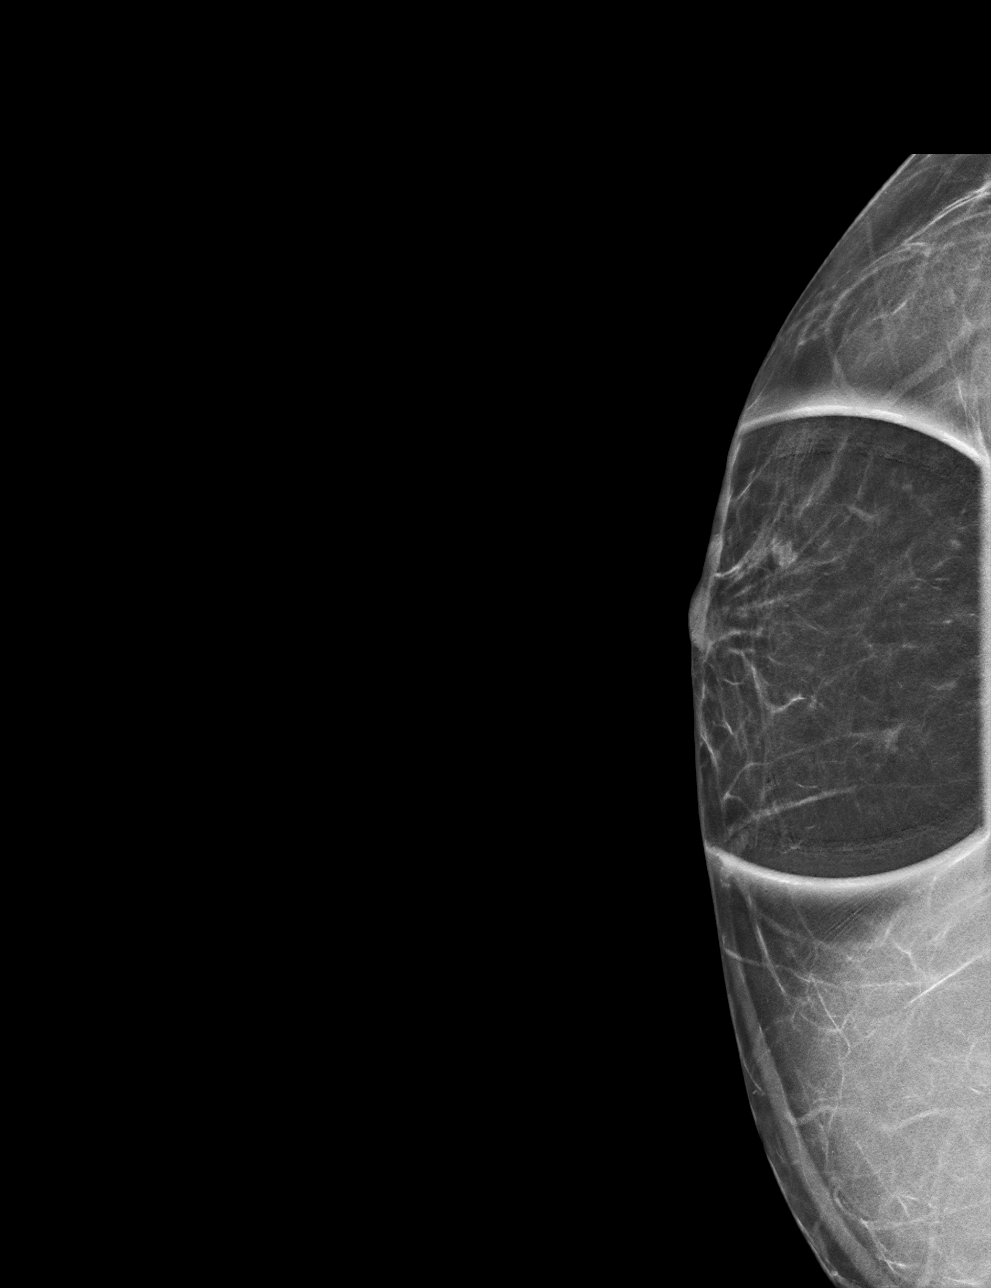

[R CC tomo · tomo slice 23/44.0]
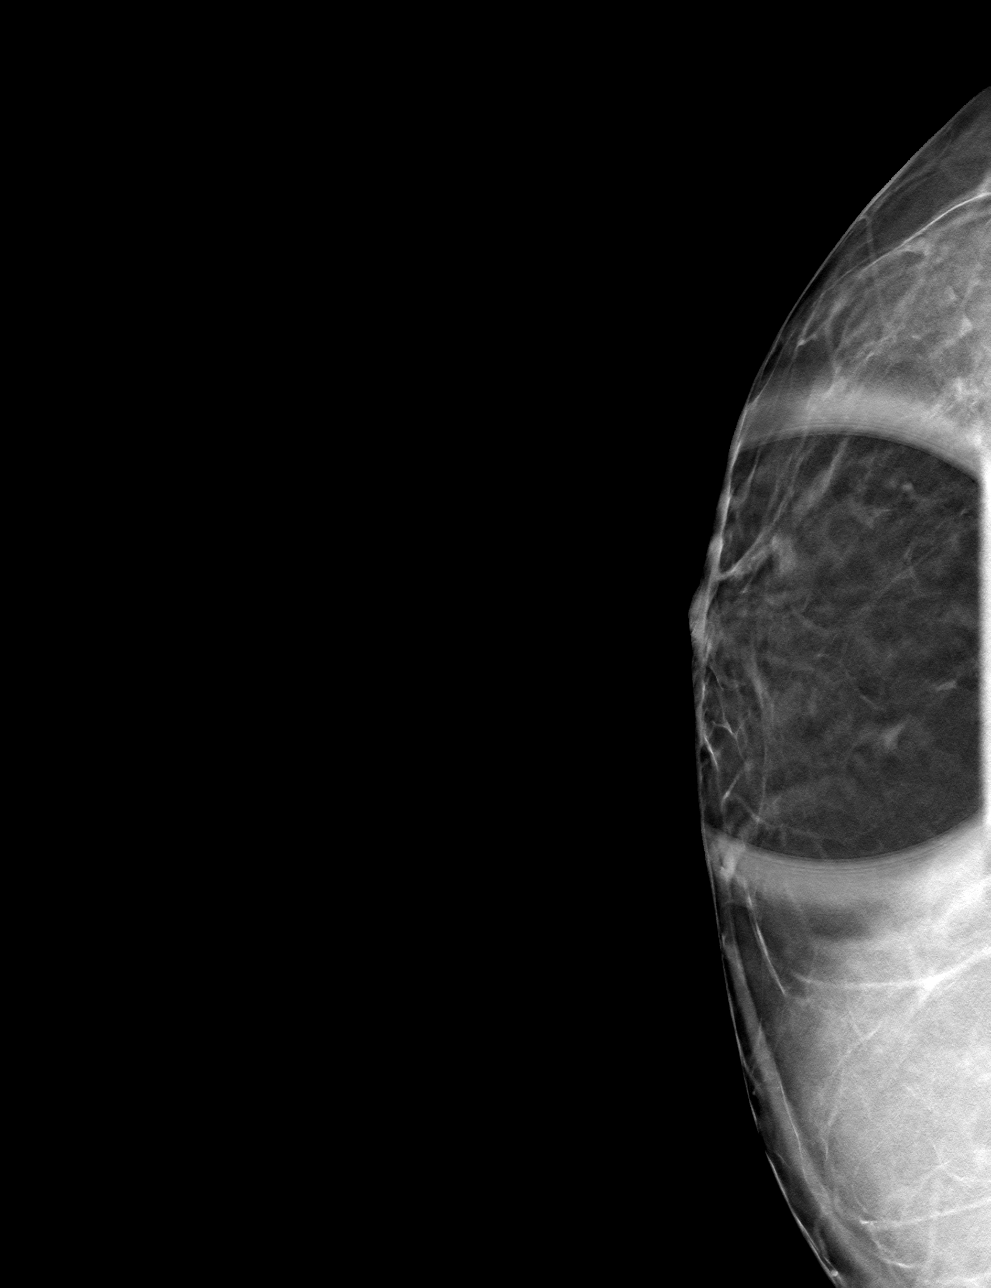

[R MLO tomo · tomo slice 29/57.0]
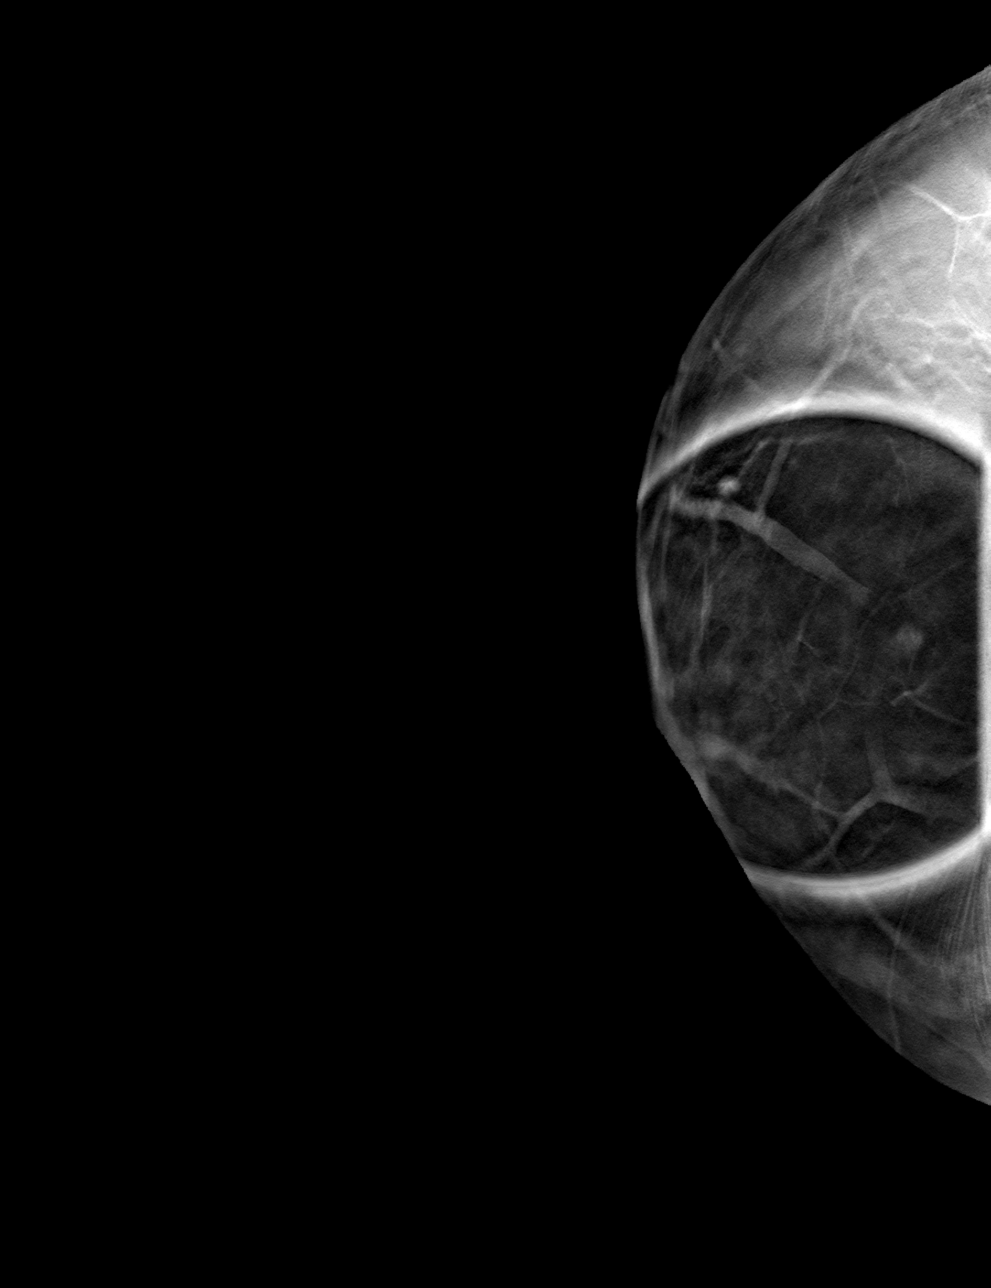

[4 of 12 positions shown; findings below may reference images not displayed]

ACR Breast Density Category b: There are scattered areas of
fibroglandular density.
FINDINGS: Tomosynthesis and synthesized spot-compression CC and MLO views of
the area of concern in the RIGHT breast were obtained.

Spot compression images confirm an approximate 4 mm isodense mass in
the LOWER OUTER subareolar location without associated architectural
distortion or suspicious calcifications.

Targeted RIGHT breast ultrasound is performed, showing a benign
simple cyst superficially just below the dermis at the 7 o'clock
subareolar location, measuring approximately 4 x 3 x 4 mm,
demonstrating posterior acoustic enhancement and no internal power
Doppler flow, corresponding to the screening mammographic finding.
No suspicious solid mass or abnormal acoustic shadowing is
identified.
IMPRESSION: Benign superficial 4 mm cyst at the 7 o'clock subareolar location
which accounts for the screening mammographic finding.

RECOMMENDATION:
Screening mammogram in one year.(Code:[id number])

I have discussed the findings and recommendations with the patient.
If applicable, a reminder letter will be sent to the patient
regarding the next appointment.

BI-RADS CATEGORY  2: Benign.

## 2021-03-22 ENCOUNTER — Other Ambulatory Visit: Payer: Self-pay

## 2021-03-22 ENCOUNTER — Encounter (INDEPENDENT_AMBULATORY_CARE_PROVIDER_SITE_OTHER): Payer: Self-pay | Admitting: Adult Health

## 2021-03-22 ENCOUNTER — Ambulatory Visit (INDEPENDENT_AMBULATORY_CARE_PROVIDER_SITE_OTHER): Payer: Commercial Managed Care - PPO | Admitting: Adult Health

## 2021-03-22 ENCOUNTER — Encounter (INDEPENDENT_AMBULATORY_CARE_PROVIDER_SITE_OTHER): Payer: Self-pay

## 2021-03-22 VITALS — BP 131/84 | HR 96 | Temp 98.2°F | Ht 62.0 in | Wt 215.0 lb

## 2021-03-22 DIAGNOSIS — R7401 Elevation of levels of liver transaminase levels: Secondary | ICD-10-CM | POA: Diagnosis not present

## 2021-03-22 DIAGNOSIS — R7303 Prediabetes: Secondary | ICD-10-CM

## 2021-03-22 DIAGNOSIS — Z6841 Body Mass Index (BMI) 40.0 and over, adult: Secondary | ICD-10-CM

## 2021-03-22 DIAGNOSIS — Z9189 Other specified personal risk factors, not elsewhere classified: Secondary | ICD-10-CM | POA: Diagnosis not present

## 2021-03-22 DIAGNOSIS — F418 Other specified anxiety disorders: Secondary | ICD-10-CM | POA: Diagnosis not present

## 2021-03-22 DIAGNOSIS — E559 Vitamin D deficiency, unspecified: Secondary | ICD-10-CM | POA: Diagnosis not present

## 2021-03-22 MED ORDER — METFORMIN HCL 500 MG PO TABS: 500.0000 mg | ORAL_TABLET | Freq: Every morning | ORAL | 0 refills | Status: DC

## 2021-03-22 NOTE — Progress Notes (Signed)
Chief Complaint:   OBESITY Sheena [name] is here to discuss her progress with her obesity treatment plan along with follow-up of her obesity related diagnoses. Sheena [name] is on the Category 2 Plan and states she is following her eating plan approximately 70% of the time. Sheena [name] states she is biking/lifting weights for 30 minutes 3 times per week.  Today's visit was #: 20 Starting weight: 250 lbs Starting date: 08/28/2020 Today's weight: 215 lbs Today's date: 03/22/2021 Total lbs lost to date: 35 lbs Total lbs lost since last in-office visit: 1 lb  Interim History: Sheena [name] says that [employer] seminars are on hold at there church.  She is not working any other therapist or speaking with her pastor currently.   She reports increased symptoms of depression (poor appetite/diet - emotional eating - fast food, exhaustion with ADLs). She states "I think losing mama is finally hitting me". She denies SI/HI. She is on Cymbalta 30mg  QD-mood control/IC pain control.  Subjective:   1. Prediabetes She is on metformin 500 mg QAM - denies GI upset.  2. Vitamin D deficiency She is not on any supplements.  3. Transaminitis She denies RUQ pain.  She denies regular acetaminophen or ETOH intake.  4. Depression with anxiety She denies SI/HI.  She is on Cymbalta 30 mg daily - mood control/IC pain control.  5. At risk for impaired function of liver Sheena [name] is at risk for impaired function of liver due to transaminitis.   Assessment/Plan:   1. Prediabetes Refill metformin 500 mg at breakfast, as per below. Check labs today.  - Hemoglobin A1c - Insulin, random - Refill metFORMIN (GLUCOPHAGE) 500 MG tablet; Take 1 tablet (500 mg total) by mouth every morning.  Dispense: 30 tablet; Refill: 0  2. Vitamin D deficiency Check vitamin D level today, as per below.  - VITAMIN D 25 Hydroxy (Vit-D Deficiency, Fractures)  3. Transaminitis Check labs today.  - Comprehensive metabolic panel  4. Depression with  anxiety Establish with a therapist - referral placed.  -Outdoor walking, continue other regular exercise.  - Ambulatory referral to Psychology  5. At risk for impaired function of liver Sheena [name] was given approximately 15 minutes of counseling today regarding prevention of impaired liver function. Sheena [name] was educated about her risk of developing NASH or even liver failure and advised that the only proven treatment for NAFLD was weight loss of at least 5-10% of body weight.    6. Current BMI 39.3  Sheena [name] is currently in the action stage of change. As such, her goal is to continue with weight loss efforts. She has agreed to the Category 2 Plan.   Exercise goals:  As is.  Behavioral modification strategies: increasing lean protein intake, decreasing simple carbohydrates, meal planning and cooking strategies, keeping healthy foods in the home, and planning for success.  Sheena [name] has agreed to follow-up with our clinic in 4 weeks. She was informed of the importance of frequent follow-up visits to maximize her success with intensive lifestyle modifications for her multiple health conditions.   Sheena [name] was informed we would discuss her lab results at her next visit unless there is a critical issue that needs to be addressed sooner. Sheena [name] agreed to keep her next visit at the agreed upon time to discuss these results.  Objective:   Blood pressure 131/84, pulse 96, temperature 98.2 F (36.8 C), height 5\' 2"  (1.575 m), weight 215 lb (97.5 kg), last menstrual period 01/29/2017, SpO2 99 %. Body mass index is 39.32 kg/m.  General: Cooperative, alert, well developed, in no acute distress. HEENT: Conjunctivae and lids unremarkable. Cardiovascular: Regular rhythm.  Lungs: Normal work of breathing. Neurologic: No focal deficits.   Lab Results  Component Value Date   CREATININE 0.92 05/20/2020   BUN 22 05/20/2020   NA 140 05/20/2020   K 4.0 05/20/2020   CL 105 05/20/2020   CO2 22 05/20/2020   Lab Results   Component Value Date   ALT 210 (H) 05/20/2020   AST 169 (H) 05/20/2020   ALKPHOS 68 05/20/2020   BILITOT 0.5 05/20/2020   Lab Results  Component Value Date   HGBA1C 5.5 05/20/2020   HGBA1C 5.4 12/17/2019   HGBA1C 5.9 (H) 08/29/2019   Lab Results  Component Value Date   INSULIN 9.8 10/19/2020   INSULIN 14.0 05/20/2020   INSULIN 11.7 12/17/2019   INSULIN 15.0 08/29/2019   Lab Results  Component Value Date   TSH 0.054 (L) 12/17/2019   Lab Results  Component Value Date   CHOL 148 05/20/2020   HDL 46 05/20/2020   LDLCALC 82 05/20/2020   TRIG 113 05/20/2020   CHOLHDL 3.2 05/20/2020   Lab Results  Component Value Date   VD25OH 49.0 05/20/2020   VD25OH 38.2 12/17/2019   VD25OH 42.2 08/29/2019   Lab Results  Component Value Date   WBC 6.9 12/17/2019   HGB 13.1 12/17/2019   HCT 42.7 12/17/2019   MCV 80 12/17/2019   PLT 298 12/17/2019   Attestation Statements:   Reviewed by clinician on day of visit: allergies, medications, problem list, medical history, surgical history, family history, social history, and previous encounter notes.  I, Water quality scientist, CMA, am acting as Location manager for Mina Marble, NP.  I have reviewed the above documentation for accuracy and completeness, and I agree with the above. -  Baleigh Rennaker d. Shandon Matson, NP-C

## 2021-03-23 LAB — COMPREHENSIVE METABOLIC PANEL
ALT: 22 IU/L (ref 0–32)
AST: 28 IU/L (ref 0–40)
Albumin/Globulin Ratio: 1.4 (ref 1.2–2.2)
Albumin: 4.3 g/dL (ref 3.8–4.9)
Alkaline Phosphatase: 73 IU/L (ref 44–121)
BUN/Creatinine Ratio: 17 (ref 9–23)
BUN: 18 mg/dL (ref 6–24)
Bilirubin Total: 0.3 mg/dL (ref 0.0–1.2)
CO2: 24 mmol/L (ref 20–29)
Calcium: 9.6 mg/dL (ref 8.7–10.2)
Chloride: 104 mmol/L (ref 96–106)
Creatinine, Ser: 1.07 mg/dL — ABNORMAL HIGH (ref 0.57–1.00)
Globulin, Total: 3 g/dL (ref 1.5–4.5)
Glucose: 82 mg/dL (ref 70–99)
Potassium: 4.6 mmol/L (ref 3.5–5.2)
Sodium: 141 mmol/L (ref 134–144)
Total Protein: 7.3 g/dL (ref 6.0–8.5)
eGFR: 62 mL/min/{1.73_m2} (ref >59)

## 2021-03-23 LAB — HEMOGLOBIN A1C
Est. average glucose Bld gHb Est-mCnc: 103 mg/dL
Hgb A1c MFr Bld: 5.2 % (ref 4.8–5.6)

## 2021-03-23 LAB — VITAMIN D 25 HYDROXY (VIT D DEFICIENCY, FRACTURES): Vit D, 25-Hydroxy: 28.1 ng/mL — ABNORMAL LOW (ref 30.0–100.0)

## 2021-03-23 LAB — INSULIN, RANDOM: INSULIN: 9.9 u[IU]/mL (ref 2.6–24.9)

## 2021-04-21 ENCOUNTER — Encounter (INDEPENDENT_AMBULATORY_CARE_PROVIDER_SITE_OTHER): Payer: Self-pay | Admitting: Adult Health

## 2021-04-21 ENCOUNTER — Other Ambulatory Visit: Payer: Self-pay

## 2021-04-21 ENCOUNTER — Ambulatory Visit (INDEPENDENT_AMBULATORY_CARE_PROVIDER_SITE_OTHER): Payer: Commercial Managed Care - PPO | Admitting: Adult Health

## 2021-04-21 VITALS — BP 134/81 | HR 90 | Temp 97.5°F | Ht 62.0 in | Wt 218.0 lb

## 2021-04-21 DIAGNOSIS — Z9189 Other specified personal risk factors, not elsewhere classified: Secondary | ICD-10-CM

## 2021-04-21 DIAGNOSIS — Z96652 Presence of left artificial knee joint: Secondary | ICD-10-CM

## 2021-04-21 DIAGNOSIS — F418 Other specified anxiety disorders: Secondary | ICD-10-CM

## 2021-04-21 DIAGNOSIS — E559 Vitamin D deficiency, unspecified: Secondary | ICD-10-CM

## 2021-04-21 DIAGNOSIS — Z6839 Body mass index (BMI) 39.0-39.9, adult: Secondary | ICD-10-CM

## 2021-04-21 DIAGNOSIS — R7303 Prediabetes: Secondary | ICD-10-CM | POA: Diagnosis not present

## 2021-04-21 MED ORDER — METFORMIN HCL 500 MG PO TABS: 500.0000 mg | ORAL_TABLET | Freq: Every morning | ORAL | 0 refills | Status: DC

## 2021-04-21 MED ORDER — VITAMIN D (ERGOCALCIFEROL) 1.25 MG (50000 UNIT) PO CAPS: 50000.0000 [IU] | ORAL_CAPSULE | ORAL | 0 refills | Status: DC

## 2021-04-21 NOTE — Progress Notes (Signed)
Chief Complaint:   OBESITY Sheena [name] is here to discuss her progress with her obesity treatment plan along with follow-up of her obesity related diagnoses. Ramla is on the Category 2 Plan and states she is following her eating plan approximately 60% of the time. Brinna states she is going to the gym for 30 minutes 3 times per week.  Today's visit was #: 21 Starting weight: 250 lbs Starting date: 08/28/2020 Today's weight: 218 lbs Today's date: 04/21/2021 Total lbs lost to date: 32 lbs Total lbs lost since last in-office visit: 0  Interim History: On 12/31/2020, [name] underwent total left knee replacement.  She stayed with her son, Mortimer Fries, for the first 3 weeks postop. On 03/22/2021, follow-up with surgeon/Dr. Theda Sers - released for exercise. She returned to work in August.  Subjective:   1. Prediabetes Discussed labs with patient today.  On 03/22/2021, A1c was 5.2 - at goal.  GFR 62. She is on metformin 500 mg daily - denies diarrhea.  2. Vitamin D deficiency Discussed labs with patient today.  On 03/22/2021, vitamin D level - 28.1 - well below goal of 50. She is not on any vitamin D supplementation.  3. Hx of total knee replacement, left 12/31/2020, [name] underwent total left knee replacement.  She stayed with her son, Mortimer Fries, for the first 3 weeks postop. On 03/22/2021, follow-up with surgeon/Dr. Theda Sers - released for exercise.  4. Depression with anxiety She denies SI/HI. She states, "I'm just sad".  Holidays are difficult. She is on Cymbalta 30 mg daily - managed by PCP.  5. At risk for activity intolerance Emberli is at risk for activity intolerance due to recent left TKR.  Assessment/Plan:   1. Prediabetes Refill metformin 500 mg daily, as per below.  - Refill metFORMIN (GLUCOPHAGE) 500 MG tablet; Take 1 tablet (500 mg total) by mouth every morning.  Dispense: 30 tablet; Refill: 0  2. Vitamin D deficiency Check labs in 2-3 months. Restart ergocalciferol 50,000 IU once  weekly.  - Restart Vitamin D, Ergocalciferol, (DRISDOL) 1.25 MG (50000 UNIT) CAPS capsule; Take 1 capsule (50,000 Units total) by mouth every 7 (seven) days.  Dispense: 4 capsule; Refill: 0  3. Hx of total knee replacement, left Continue regular exercise.  4. Depression with anxiety Referral to [facility] - female provider preferred.  - Ambulatory referral to [facility]  5. At risk for activity intolerance Alyzae was given approximately 15 minutes of exercise intolerance counseling today. She is 55 y.o. female and has risk factors exercise intolerance including obesity. We discussed intensive lifestyle modifications today with an emphasis on specific weight loss instructions and strategies. Milani will slowly increase activity as tolerated.  Repetitive spaced learning was employed today to elicit superior memory formation and behavioral change.   6. Class 2 severe obesity with serious comorbidity and body mass index (BMI) of 39.0 to 39.9 in adult, unspecified obesity type (HCC)  Suvi is currently in the action stage of change. As such, her goal is to continue with weight loss efforts. She has agreed to keeping a food journal and adhering to recommended goals of 1200 calories and 85 grams of protein.   Convert from Category 2 to journaling.  If hungry - have a 200 calorie/high protein snack.  Exercise goals:  As is.  Behavioral modification strategies: increasing lean protein intake, decreasing simple carbohydrates, meal planning and cooking strategies, keeping healthy foods in the home, and planning for success.  Evana has agreed to follow-up with our clinic  in 4 weeks. She was informed of the importance of frequent follow-up visits to maximize her success with intensive lifestyle modifications for her multiple health conditions.   Objective:   Blood pressure 134/81, pulse 90, temperature (!) 97.5 F (36.4 C), height 5\' 2"  (1.575 m), weight 218 lb (98.9 kg), last menstrual  period 01/29/2017, SpO2 99 %. Body mass index is 39.87 kg/m.  General: Cooperative, alert, well developed, in no acute distress. HEENT: Conjunctivae and lids unremarkable. Cardiovascular: Regular rhythm.  Lungs: Normal work of breathing. Neurologic: No focal deficits.   Lab Results  Component Value Date   CREATININE 1.07 (H) 03/22/2021   BUN 18 03/22/2021   NA 141 03/22/2021   K 4.6 03/22/2021   CL 104 03/22/2021   CO2 24 03/22/2021   Lab Results  Component Value Date   ALT 22 03/22/2021   AST 28 03/22/2021   ALKPHOS 73 03/22/2021   BILITOT 0.3 03/22/2021   Lab Results  Component Value Date   HGBA1C 5.2 03/22/2021   HGBA1C 5.5 05/20/2020   HGBA1C 5.4 12/17/2019   HGBA1C 5.9 (H) 08/29/2019   Lab Results  Component Value Date   INSULIN 9.9 03/22/2021   INSULIN 9.8 10/19/2020   INSULIN 14.0 05/20/2020   INSULIN 11.7 12/17/2019   INSULIN 15.0 08/29/2019   Lab Results  Component Value Date   TSH 0.054 (L) 12/17/2019   Lab Results  Component Value Date   CHOL 148 05/20/2020   HDL 46 05/20/2020   LDLCALC 82 05/20/2020   TRIG 113 05/20/2020   CHOLHDL 3.2 05/20/2020   Lab Results  Component Value Date   VD25OH 28.1 (L) 03/22/2021   VD25OH 49.0 05/20/2020   VD25OH 38.2 12/17/2019   Lab Results  Component Value Date   WBC 6.9 12/17/2019   HGB 13.1 12/17/2019   HCT 42.7 12/17/2019   MCV 80 12/17/2019   PLT 298 12/17/2019   Attestation Statements:   Reviewed by clinician on day of visit: allergies, medications, problem list, medical history, surgical history, family history, social history, and previous encounter notes.  I, Water quality scientist, CMA, am acting as Location manager for Mina Marble, NP.  I have reviewed the above documentation for accuracy and completeness, and I agree with the above. -  Antron Seth d. Dupree Givler, NP-C

## 2021-05-19 ENCOUNTER — Ambulatory Visit (INDEPENDENT_AMBULATORY_CARE_PROVIDER_SITE_OTHER): Payer: Commercial Managed Care - PPO | Admitting: Adult Health

## 2021-06-01 ENCOUNTER — Ambulatory Visit (INDEPENDENT_AMBULATORY_CARE_PROVIDER_SITE_OTHER): Payer: Commercial Managed Care - PPO | Admitting: Adult Health

## 2021-06-01 ENCOUNTER — Other Ambulatory Visit: Payer: Self-pay

## 2021-06-01 ENCOUNTER — Encounter (INDEPENDENT_AMBULATORY_CARE_PROVIDER_SITE_OTHER): Payer: Self-pay | Admitting: Adult Health

## 2021-06-01 VITALS — BP 123/75 | HR 92 | Temp 97.6°F | Ht 62.0 in | Wt 216.0 lb

## 2021-06-01 DIAGNOSIS — R7303 Prediabetes: Secondary | ICD-10-CM

## 2021-06-01 DIAGNOSIS — Z9189 Other specified personal risk factors, not elsewhere classified: Secondary | ICD-10-CM | POA: Diagnosis not present

## 2021-06-01 DIAGNOSIS — Z6839 Body mass index (BMI) 39.0-39.9, adult: Secondary | ICD-10-CM | POA: Diagnosis not present

## 2021-06-01 MED ORDER — METFORMIN HCL 500 MG PO TABS: 500.0000 mg | ORAL_TABLET | Freq: Every morning | ORAL | 0 refills | Status: DC

## 2021-06-02 NOTE — Progress Notes (Signed)
Chief Complaint:   OBESITY Sheena [name] is here to discuss her progress with her obesity treatment plan along with follow-up of her obesity related diagnoses. Sheena [name] is on keeping a food journal and adhering to recommended goals of 1200 calories and 85 grams of protein and states she is following her eating plan approximately 0% of the time. Sheena [name] states she is biking for 15 minutes 7 times per week.  Today's visit was #: 22 Starting weight: 250 lbs Starting date: 08/28/2020 Today's weight: 216 lbs Today's date: 06/01/2021 Total lbs lost to date: 34 lbs Total lbs lost since last in-office visit: 2 lbs  Interim History:  Sheena [name] spent Thanksgiving with her son, Sheena [name], her cousin, and friends.  Had chicken casserole and tofu roast (Her son's "friend" is a vegan and brought the tofu). 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  She reports that this is "[place] Season" for work - laptops provided to work from home. New release of 2023 budget all Medicaid programs need to be updated with new finance guidelines.  At last OV she was concerted from Cat plan to Valinda lasted 10 minutes - went back to Category 2.  Subjective:   1. Prediabetes On 03/22/2021, A1c was 5.3.  CMP - GFR 62. She reports tolerating metformin 500 mg well. She denies GI upset or polyphagia.  2. At risk for diabetes mellitus Sheena [name] is at higher than average risk for developing diabetes due to prediabetes and obesity.  Assessment/Plan:   1. Prediabetes Check fasting labs at next office visit. Refill metformin 500 mg daily, as per below.  - Refill metFORMIN (GLUCOPHAGE) 500 MG tablet; Take 1 tablet (500 mg total) by mouth every morning.  Dispense: 30 tablet; Refill: 0  2. At risk for diabetes mellitus Sheena [name] was given approximately 15 minutes of diabetes education and counseling today. We discussed intensive lifestyle modifications today with an emphasis on weight loss as well as increasing exercise and  decreasing simple carbohydrates in her diet. We also reviewed medication options with an emphasis on risk versus benefit of those discussed.   Repetitive spaced learning was employed today to elicit superior memory formation and behavioral change.  3. Class 2 severe obesity with serious comorbidity and body mass index (BMI) of 39.0 to 39.9 in adult, unspecified obesity type (HCC)  Sheena [name] is currently in the action stage of change. As such, her goal is to continue with weight loss efforts. She has agreed to the Category 2 Plan.   Exercise goals:  As is.  Behavioral modification strategies: increasing lean protein intake, decreasing simple carbohydrates, meal planning and cooking strategies, keeping healthy foods in the home, and planning for success.  Sheena [name] has agreed to follow-up with our clinic in 4 weeks, fasting. She was informed of the importance of frequent follow-up visits to maximize her success with intensive lifestyle modifications for her multiple health conditions.   Objective:   Blood pressure 123/75, pulse 92, temperature 97.6 F (36.4 C), height 5\' 2"  (1.575 m), weight 216 lb (98 kg), last menstrual period 01/29/2017, SpO2 100 %. Body mass index is 39.51 kg/m.  General: Cooperative, alert, well developed, in no acute distress. HEENT: Conjunctivae and lids unremarkable. Cardiovascular: Regular rhythm.  Lungs: Normal work of breathing. Neurologic: No focal deficits.   Lab Results  Component Value Date   CREATININE 1.07 (H) 03/22/2021   BUN 18 03/22/2021   NA 141 03/22/2021   K 4.6 03/22/2021   CL 104 03/22/2021  CO2 24 03/22/2021   Lab Results  Component Value Date   ALT 22 03/22/2021   AST 28 03/22/2021   ALKPHOS 73 03/22/2021   BILITOT 0.3 03/22/2021   Lab Results  Component Value Date   HGBA1C 5.2 03/22/2021   HGBA1C 5.5 05/20/2020   HGBA1C 5.4 12/17/2019   HGBA1C 5.9 (H) 08/29/2019   Lab Results  Component Value Date   INSULIN 9.9 03/22/2021    INSULIN 9.8 10/19/2020   INSULIN 14.0 05/20/2020   INSULIN 11.7 12/17/2019   INSULIN 15.0 08/29/2019   Lab Results  Component Value Date   TSH 0.054 (L) 12/17/2019   Lab Results  Component Value Date   CHOL 148 05/20/2020   HDL 46 05/20/2020   LDLCALC 82 05/20/2020   TRIG 113 05/20/2020   CHOLHDL 3.2 05/20/2020   Lab Results  Component Value Date   VD25OH 28.1 (L) 03/22/2021   VD25OH 49.0 05/20/2020   VD25OH 38.2 12/17/2019   Lab Results  Component Value Date   WBC 6.9 12/17/2019   HGB 13.1 12/17/2019   HCT 42.7 12/17/2019   MCV 80 12/17/2019   PLT 298 12/17/2019   Attestation Statements:   Reviewed by clinician on day of visit: allergies, medications, problem list, medical history, surgical history, family history, social history, and previous encounter notes.  I, Water quality scientist, CMA, am acting as Location manager for Mina Marble, NP.  I have reviewed the above documentation for accuracy and completeness, and I agree with the above. -  Jossue Rubenstein d. Daisi Kentner, NP-C

## 2021-06-08 ENCOUNTER — Ambulatory Visit (INDEPENDENT_AMBULATORY_CARE_PROVIDER_SITE_OTHER): Payer: Commercial Managed Care - PPO | Admitting: Psychology

## 2021-06-08 DIAGNOSIS — F4323 Adjustment disorder with mixed anxiety and depressed mood: Secondary | ICD-10-CM | POA: Diagnosis not present

## 2021-06-08 NOTE — Progress Notes (Deleted)
Comprehensive Clinical Assessment (CCA) Note  06/08/2021 Sheena [name] [id number]  Chief Complaint: No chief complaint on file.  Visit Diagnosis: ***    CCA Screening, Triage and Referral (STR)  Patient Reported Information How did you hear about [place]? No data recorded Referral name: No data recorded Referral phone number: No data recorded  Whom do you see for routine medical problems? No data recorded Practice/Facility Name: No data recorded Practice/Facility Phone Number: No data recorded Name of Contact: No data recorded Contact Number: No data recorded Contact Fax Number: No data recorded Prescriber Name: No data recorded Prescriber Address (if known): No data recorded  What Is the Reason for Your Visit/Call Today? No data recorded How Long Has This Been Causing You Problems? No data recorded What Do You Feel Would Help You the Most Today? No data recorded  Have You Recently Been in Any Inpatient Treatment (Hospital/Detox/Crisis Center/28-Day Program)? No data recorded Name/Location of Program/Hospital:No data recorded How Long Were You There? No data recorded When Were You Discharged? No data recorded  Have You Ever Received Services From [facility] Before? No data recorded Who Do You See at [facility]? No data recorded  Have You Recently Had Any Thoughts About Hurting Yourself? No data recorded Are You Planning to Commit Suicide/Harm Yourself At This time? No data recorded  Have you Recently Had Thoughts About [place]? No data recorded Explanation: No data recorded  Have You Used Any Alcohol or Drugs in the Past 24 Hours? No data recorded How Long Ago Did You Use Drugs or Alcohol? No data recorded What Did You Use and How Much? No data recorded  Do You Currently Have a Therapist/Psychiatrist? No data recorded Name of Therapist/Psychiatrist: No data recorded  Have You Been Recently Discharged From Any Office Practice or Programs? No data  recorded Explanation of Discharge From Practice/Program: No data recorded    CCA Screening Triage Referral Assessment Type of Contact: No data recorded Is this Initial or Reassessment? No data recorded Date Telepsych consult ordered in [facility]:  No data recorded Time Telepsych consult ordered in [facility]:  No data recorded  Patient Reported Information Reviewed? No data recorded Patient Left Without Being Seen? No data recorded Reason for Not Completing Assessment: No data recorded  Collateral Involvement: No data recorded  Does Patient Have a [place]? No data recorded Name and Contact of Legal Guardian: No data recorded If Minor and Not Living with Parent(s), Who has Custody? No data recorded Is CPS involved or ever been involved? No data recorded Is APS involved or ever been involved? No data recorded  Patient Determined To Be At Risk for Harm To Self or Others Based on Review of Patient Reported Information or Presenting Complaint? No data recorded Method: No data recorded Availability of Means: No data recorded Intent: No data recorded Notification Required: No data recorded Additional Information for Danger to Others Potential: No data recorded Additional Comments for Danger to Others Potential: No data recorded Are There Guns or Other Weapons in Your Home? No data recorded Types of Guns/Weapons: No data recorded Are These Weapons Safely Secured?                            No data recorded Who Could Verify You Are Able To Have These Secured: No data recorded Do You Have any Outstanding Charges, Pending Court Dates, Parole/Probation? No data recorded Contacted To Inform of Risk of Harm  To Self or Others: No data recorded  Location of Assessment: No data recorded  Does Patient Present under Involuntary Commitment? No data recorded IVC Papers Initial File Date: No data recorded  [place] of Residence: No data recorded  Patient Currently Receiving the Following  Services: No data recorded  Determination of Need: No data recorded  Options For Referral: No data recorded    CCA Biopsychosocial Intake/Chief Complaint:  No data recorded Current Symptoms/Problems: No data recorded  Patient Reported Schizophrenia/Schizoaffective Diagnosis in Past: No data recorded  Strengths: No data recorded Preferences: No data recorded Abilities: No data recorded  Type of Services Patient Feels are Needed: No data recorded  Initial Clinical Notes/Concerns: No data recorded  Mental Health Symptoms Depression:  No data recorded  Duration of Depressive symptoms: No data recorded  Mania:  No data recorded  Anxiety:   No data recorded  Psychosis:  No data recorded  Duration of Psychotic symptoms: No data recorded  Trauma:  No data recorded  Obsessions:  No data recorded  Compulsions:  No data recorded  Inattention:  No data recorded  Hyperactivity/Impulsivity:  No data recorded  Oppositional/Defiant Behaviors:  No data recorded  Emotional Irregularity:  No data recorded  Other Mood/Personality Symptoms:  No data recorded   Mental Status Exam Appearance and self-care  Stature:  No data recorded  Weight:  No data recorded  Clothing:  No data recorded  Grooming:  No data recorded  Cosmetic use:  No data recorded  Posture/gait:  No data recorded  Motor activity:  No data recorded  Sensorium  Attention:  No data recorded  Concentration:  No data recorded  Orientation:  No data recorded  Recall/memory:  No data recorded  Affect and Mood  Affect:  No data recorded  Mood:  No data recorded  Relating  Eye contact:  No data recorded  Facial expression:  No data recorded  Attitude toward examiner:  No data recorded  Thought and Language  Speech flow: No data recorded  Thought content:  No data recorded  Preoccupation:  No data recorded  Hallucinations:  No data recorded  Organization:  No data recorded  [employer] of Knowledge:   No data recorded  Intelligence:  No data recorded  Abstraction:  No data recorded  Judgement:  No data recorded  Reality Testing:  No data recorded  Insight:  No data recorded  Decision Making:  No data recorded  Social Functioning  Social Maturity:  No data recorded  Social Judgement:  No data recorded  Stress  Stressors:  No data recorded  Coping Ability:  No data recorded  Skill Deficits:  No data recorded  Supports:  No data recorded    Religion:    Leisure/Recreation:    Exercise/Diet:     CCA Employment/Education Employment/Work Situation:    Education:     CCA Family/Childhood History Family and Relationship History:    Childhood History:     Child/Adolescent Assessment:     CCA Substance Use Alcohol/Drug Use:                           ASAM's:  Six Dimensions of Multidimensional Assessment  Dimension 1:  Acute Intoxication and/or Withdrawal Potential:      Dimension 2:  Biomedical Conditions and Complications:      Dimension 3:  Emotional, Behavioral, or Cognitive Conditions and Complications:     Dimension 4:  Readiness to Change:  Dimension 5:  Relapse, Continued use, or Continued Problem Potential:     Dimension 6:  Recovery/Living Environment:     ASAM Severity Score:    ASAM Recommended Level of Treatment:     Substance use Disorder (SUD)    Recommendations for Services/Supports/Treatments:    DSM5 Diagnoses: Patient Active Problem List   Diagnosis Date Noted   Depression with anxiety 03/22/2021   Chronic pain of left knee 12/03/2020   Abnormal liver function 12/01/2020   Class 2 obesity due to excess calories with body mass index (BMI) of 38.0 to 38.9 in adult 08/27/2020   Tobacco use disorder 08/27/2020   Transaminitis 07/04/2020   Chronic kidney disease, stage 3a ([place]) 01/07/2020   History of iron deficiency anemia 01/07/2020   Folate deficiency 01/07/2020   Polyp of colon 01/06/2020   Prediabetes  12/17/2019   Vitamin D deficiency 12/17/2019   Class 2 severe obesity with serious comorbidity and body mass index (BMI) of 39.0 to 39.9 in adult ([place]) 12/17/2019   Absolute anemia 02/20/2014   Anemia, iron deficiency 02/20/2014   Esophageal reflux 02/20/2014   Other hyperlipidemia    GERD (gastroesophageal reflux disease)    STD (sexually transmitted disease)    Hypothyroidism    IC (interstitial cystitis)     Patient Centered Plan: Patient is on the following Treatment Plan(s):  {CH[facility] AMB BH OP Treatment Plans:[id number]}   Referrals to Alternative Service(s): Referred to Alternative Service(s):   Place:   Date:   Time:    Referred to Alternative Service(s):   Place:   Date:   Time:    Referred to Alternative Service(s):   Place:   Date:   Time:    Referred to Alternative Service(s):   Place:   Date:   Time:     Clint Bolder, LCSW

## 2021-06-08 NOTE — Progress Notes (Addendum)
[facility] Counselor Initial Adult Exam  Name: Sheena [name] Date: 06/08/2021 MRN: [id number] DOB: 03-Sep-1965 PCP: Reynold Bowen, MD  Time spent: 10:00am-11:00am    60 minutes  Guardian/Payee:  N/A    Paperwork requested: No   Reason for Visit /Presenting Problem:  Pt present for face-to-face initial assessment via video Webex.   Pt consents to telehealth video session due to COVID 19 pandemic.  Location of pt: home Location of therapist: home office Pt has had a lot of losses.  Pt's main family was her mother, father and her husband and son.  Pt is an only child.  In September 24, 2013 pt's father passed away.   In 2014/09/25 pt's mother went to a nursing home and passed away in 2020-08-27.    In 08-28-2019 pt's husband had a massive heart attack and passed away.   Pt's family is now just her 14 yo son.   Pt has been grieving her losses especially during the holidays.   Pt states she is grieving the life she had.  She spent a lot of time caring for her mother and her husband wanted more time with her but she couldn't give it to him and then he passed away.  Pt struggles with guilt and feels very alone.  Pt's son Mortimer Fries lives next door and they have a good relationship overall.    Mental Status Exam: Appearance:   Casual     Behavior:  Appropriate  Motor:  Normal  Speech/Language:   Normal Rate  Affect:  Appropriate  Mood:  normal  Thought process:  normal  Thought content:    WNL  Sensory/Perceptual disturbances:    WNL  Orientation:  oriented to person, place, time/date, and situation  Attention:  Good  Concentration:  Good  Memory:  WNL  Fund of knowledge:   Good  Insight:    Good  Judgment:   Good  Impulse Control:  Good    Reported Symptoms:  anxiety, worrying, grief  Risk Assessment: Danger to Self:  No Self-injurious Behavior: No Danger to Others: No Duty to Warn:no Physical Aggression / Violence:No  Access to Firearms a concern: No  Gang Involvement:No   Patient / guardian was educated about steps to take if suicide or homicide risk level increases between visits: n/a While future psychiatric events cannot be accurately predicted, the patient does not currently require acute inpatient psychiatric care and does not currently meet [facility] involuntary commitment criteria.  Substance Abuse History: Current substance abuse: No     Past Psychiatric History:   Previous psychological history is significant for anxiety Outpatient Providers:Pt states she saw a therapist briefly in 25-Sep-2019 but it was not a good fit.  History of Psych Hospitalization: No  Psychological Testing:  N/A    Abuse History:  Victim of: No.,  n/a    Report needed: No. Victim of Neglect:No. Perpetrator of  n/a   Witness / Exposure to Domestic Violence: No   Protective Services Involvement: No  Witness to [employer] Violence:  No   Family History:  Family History  Problem Relation Age of Onset   Rheum arthritis Mother    Hyperlipidemia Mother    Thyroid disease Mother    Obesity Mother    Diabetes Father    Hypertension Father    Melanoma Father        melanoma   Colon polyps Father    Crohn's disease Father    Cancer Father        ?  type   Hyperlipidemia Father    Thyroid disease Father    Obesity Father    Breast cancer Cousin 82       Pat 1st cousin   Colon cancer Neg Hx    Pancreatic cancer Neg Hx    Esophageal cancer Neg Hx     Living situation: the patient lives alone Pt was very close to her parents and lives next door.  Pt is an only child.  Pt states she had a good childhood. No family history of mental health issues or substance abuse.    Sexual Orientation: Straight  Relationship Status: widowed  Name of spouse / other:  husband is deceased. If a parent, number of children / ages: pt has one son who is 5 years old.    Support Systems: lives alone.  Pt has 2 close friends who are very supportive.    Financial Stress:  No    Income/Employment/Disability: Employment.  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  Pt works full time.  At times pt's job is stressful.  Pt has worked there for 23 years.  Pt works from home.    Military Service: No   Educational History: Education: some college  Religion/Sprituality/World View: Protestant  Any cultural differences that may affect / interfere with treatment:  not applicable   Recreation/Hobbies: Pt loves to spend time with her 3 cats.  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  Pt likes doing crafts.    Stressors: Loss of parents and husband.    Strengths: Supportive Relationships, Church, and Able to Communicate Effectively  Barriers:  none noted.   Legal History: Pending legal issue / charges: The patient has no significant history of legal issues. History of legal issue / charges:  none  Medical History/Surgical History: reviewed Past Medical History:  Diagnosis Date   ADHD    Anemia    Anxiety    Back pain    Chronic kidney disease    Depression    GERD (gastroesophageal reflux disease)    High cholesterol    IC (interstitial cystitis)    Interstitial cystitis    Knee pain, bilateral    Obesity    Osteoarthritis    PMB (postmenopausal bleeding)    Pre-diabetes    STD (sexually transmitted disease)    HSV ll   Thyroid disease    Hypothyroid   Vitamin D deficiency     Past Surgical History:  Procedure Laterality Date   CESAREAN SECTION     x 1   colonscopy     FOOT SURGERY Right    HYSTEROSCOPY WITH D & C N/A 10/31/2019   Procedure: DILATATION AND CURETTAGE /HYSTEROSCOPY;  Surgeon: Vanessa Kick, MD;  Location: [facility];  Service: Gynecology;  Laterality: N/A;   INJECTION KNEE Left 10/23/2019   versi knee gel injection and 10-30-19 to be done   WISDOM TOOTH EXTRACTION  2000    Medications: Current Outpatient Medications  Medication Sig Dispense Refill   ALPRAZolam (XANAX) 0.5 MG tablet  2 (two) times daily as needed.      DULoxetine (CYMBALTA) 30 MG capsule Take 1 capsule (30 mg total) by mouth daily. (Patient taking differently: Take 30 mg by mouth at bedtime.) 30 capsule 0   FENOFIBRATE PO Take 160 mg by mouth every evening.      levothyroxine (SYNTHROID) 175 MCG tablet Euthyrox 175 mcg tablet     lisdexamfetamine (VYVANSE)  30 MG capsule Take 30 mg by mouth daily.     metFORMIN (GLUCOPHAGE) 500 MG tablet Take 1 tablet (500 mg total) by mouth every morning. 30 tablet 0   Omeprazole (PRILOSEC PO) Take 20 mg by mouth daily.      TOPIRAMATE PO Take 50 mg by mouth daily.      traZODone (DESYREL) 50 MG tablet at bedtime.      Vitamin D, Ergocalciferol, (DRISDOL) 1.25 MG (50000 UNIT) CAPS capsule Take 1 capsule (50,000 Units total) by mouth every 7 (seven) days. 4 capsule 0   No current facility-administered medications for this visit.    Allergies  Allergen Reactions   Sulfa Antibiotics Swelling    Diagnoses:  F43.23  Plan of Care:  Recommend ongoing therapy.    Pt participated in setting treatment goals.  She wants to improve coping skills and have a supportive place to talk.  Plan to meet every 2 weeks.  Recommended pt journal her thoughts and feelings.    Treatment Plan Client Abilities/Strengths  Pt is bright, engaging, and motivated for therapy.  Client Treatment Preferences  Individual therapy.  Client Statement of Needs  Improve copings skills and have safe place to talk.  Symptoms  Depressed or irritable mood. Excessive and/or unrealistic worry that is difficult to control occurring more days than not for at least 6 months about a number of events or activities. Grief. Problems Addressed  Unipolar Depression, Anxiety Goals 1. Alleviate depressive symptoms and return to previous level of effective functioning. 2. Appropriately grieve the loss in order to normalize mood and to return to previously adaptive level of functioning. Objective Learn and implement  behavioral strategies to overcome depression. Target Date: 2022-06-08 Frequency: Biweekly  Progress: 10 Modality: individual  Related Interventions Assist the client in developing skills that increase the likelihood of deriving pleasure from behavioral activation (e.g., assertiveness skills, developing an exercise plan, less internal/more external focus, increased social involvement); reinforce success. Engage the client in "behavioral activation," increasing his/her activity level and contact with sources of reward, while identifying processes that inhibit activation. use behavioral techniques such as instruction, rehearsal, role-playing, role reversal, as needed, to facilitate activity in the client's daily life; reinforce success. 3. Develop healthy interpersonal relationships that lead to the alleviation and help prevent the relapse of depression. 4. Develop healthy thinking patterns and beliefs about self, others, and the world that lead to the alleviation and help prevent the relapse of depression. 5. Enhance ability to effectively cope with the full variety of life's worries and anxieties. 6. Learn and implement coping skills that result in a reduction of anxiety and worry, and improved daily functioning. Objective Learn and implement problem-solving strategies for realistically addressing worries. Target Date: 2022-06-08 Frequency: Biweekly  Progress: 10 Modality: individual  Related Interventions Assign the client a homework exercise in which he/she problem-solves a current problem.  review, reinforce success, and provide corrective feedback toward improvement. Teach the client problem-solving strategies involving specifically defining a problem, generating options for addressing it, evaluating the pros and cons of each option, selecting and implementing an optional action, and reevaluating and refining the action. Objective Learn and implement calming skills to reduce overall anxiety and  manage anxiety symptoms. Target Date: 2022-06-08 Frequency: Biweekly  Progress: 10 Modality: individual  Related Interventions Assign the client to read about progressive muscle relaxation and other calming strategies in relevant books or treatment manuals. Assign the client homework each session in which he/she practices relaxation exercises daily, gradually applying them progressively  from non-anxiety-provoking to anxiety-provoking situations; review and reinforce success while providing corrective feedback toward improvement. Teach the client calming/relaxation skills (e.g., applied relaxation, progressive muscle relaxation, cue controlled relaxation; mindful breathing; biofeedback) and how to discriminate better between relaxation and tension; teach the client how to apply these skills to his/her daily life. 7. Recognize, accept, and cope with feelings of depression. 8. Reduce overall frequency, intensity, and duration of the anxiety so that daily functioning is not impaired. 9. Resolve the core conflict that is the source of anxiety. 10. Stabilize anxiety level while increasing ability to function on a daily basis. Diagnosis F43.23   Conditions For Discharge Achievement of treatment goals and objectives    Clint Bolder, LCSW

## 2021-06-24 ENCOUNTER — Ambulatory Visit (INDEPENDENT_AMBULATORY_CARE_PROVIDER_SITE_OTHER): Payer: Commercial Managed Care - PPO | Admitting: Psychology

## 2021-06-24 DIAGNOSIS — F4323 Adjustment disorder with mixed anxiety and depressed mood: Secondary | ICD-10-CM

## 2021-06-24 NOTE — Progress Notes (Signed)
[place] Counselor/Therapist Progress Note  Patient ID: Sheena [name], MRN: [id number],    Date: 06/24/2021  Time Spent: 5:00pm-6:00pm   60 minutes   Treatment Type: Individual Therapy  Reported Symptoms: sadness, tearfulness  Mental Status Exam: Appearance:  Well Groomed     Behavior: Appropriate  Motor: Normal  Speech/Language:  Normal Rate  Affect: Appropriate and Tearful  Mood: sad  Thought process: normal  Thought content:   WNL  Sensory/Perceptual disturbances:   WNL  Orientation: oriented to person, place, time/date, and situation  Attention: Good  Concentration: Good  Memory: WNL  Fund of knowledge:  Good  Insight:   Good  Judgment:  Good  Impulse Control: Good   Risk Assessment: Danger to Self:  No Self-injurious Behavior: No Danger to Others: No Duty to Warn:no Physical Aggression / Violence:No  Access to Firearms a concern: No  Gang Involvement:No   Subjective:  Pt present for face-to-face individual therapy via video Webex.  Pt consents to telehealth video session due to COVID 19 pandemic. Location of pt: home. Location of therapist: home office. Pt talked about the holidays.  She said they went better than she expected despite of all of her recent losses.   Pt had an aunt and uncle visit and pt was with her son and a friend as well.  Christmas day was very different than it use to be.  She missed her husband and parents.   There were times when the grief bubbled up.   Pt's husband's ashes are in her den.  Pt visited her parents' graves.   The 5 years before this year pt's Christmas was spent at the nursing home with her mother.   Pt talked about feeling panic at times about her husband and parents really being gone and feeling so lonely.  At times pt will write in her journal about her feelings and this is therapeutic for her.  Pt shared some of her journal entries.  She wrote a lot about things she remembers about things she misses.  She also  wrote about feeling angry.  She feels anger at herself for not spending more time with her husband.  She feels anger at husband for not taking better care of himself.   She feels anger at her mother for making her feel guilty if she was not where she wanted her to be.   Pt's mother did not care for others the way pt cared for her.  Pt feels angry that she feels the way she feels.  Pt feels anger at the nursing home for how bad it was.  There were times when her mother was neglected there especially during [place].   Pt feels angry about people going forward with life when "hers has been shattered".   Pt states she wants to have the tools to carve out a life for herself.   She went to a grief share group last year that was helpful.   Pt realized there that she has to carve out a new identity.  She was married for 30 years and now is not married.   She is no longer a wife or daughter.  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  Pt is having a hard time finding her joy and happiness. Helped pt process her feelings and grief.  Addressed how she can begin to explore new identities for herself by trying some new things and making new connections.  Provided supportive therapy.      Interventions: Cognitive Behavioral Therapy, Grief Therapy, and Insight-Oriented  Diagnosis: F43.23  Plan:  Plan to meet in 2 weeks.   Pt is progressing toward goals.    Treatment Plan  (Target Date 06/08/22)  Client Abilities/Strengths   Pt is bright, engaging, and motivated for therapy.  Client Treatment Preferences   Individual therapy.  Client Statement of Needs   Improve copings skills and have safe place to talk.  Symptoms   Depressed or irritable mood. Excessive and/or unrealistic worry that is difficult to control occurring more days than not for at least 6 months about a number of events or activities. Grief. Problems Addressed   Unipolar Depression, Anxiety Goals  1. Alleviate  depressive symptoms and return to previous level of effective functioning.  2. Appropriately grieve the loss in order to normalize mood and to return to previously adaptive level of functioning.  Objective  Learn and implement behavioral strategies to overcome depression. Target Date: 2022-06-08 Frequency: Biweekly  Progress: 10 Modality: individual  Related Interventions  Assist the client in developing skills that increase the likelihood of deriving pleasure from behavioral activation (e.g., assertiveness skills, developing an exercise plan, less internal/more external focus, increased social involvement); reinforce success. Engage the client in "behavioral activation," increasing his/her activity level and contact with sources of reward, while identifying processes that inhibit activation. use behavioral techniques such as instruction, rehearsal, role-playing, role reversal, as needed, to facilitate activity in the client's daily life; reinforce success. 3. Develop healthy interpersonal relationships that lead to the alleviation and help prevent the relapse of depression.  4. Develop healthy thinking patterns and beliefs about self, others, and the world that lead to the alleviation and help prevent the relapse of depression.  5. Enhance ability to effectively cope with the full variety of life's worries and anxieties.  6. Learn and implement coping skills that result in a reduction of anxiety and worry, and improved daily functioning.  Objective  Learn and implement problem-solving strategies for realistically addressing worries. Target Date: 2022-06-08 Frequency: Biweekly  Progress: 10 Modality: individual  Related Interventions  Assign the client a homework exercise in which he/she problem-solves a current problem.  review, reinforce success, and provide corrective feedback toward improvement. Teach the client problem-solving strategies involving specifically defining a problem,  generating options for addressing it, evaluating the pros and cons of each option, selecting and implementing an optional action, and reevaluating and refining the action. Objective  Learn and implement calming skills to reduce overall anxiety and manage anxiety symptoms. Target Date: 2022-06-08 Frequency: Biweekly  Progress: 10 Modality: individual  Related Interventions  Assign the client to read about progressive muscle relaxation and other calming strategies in relevant books or treatment manuals. Assign the client homework each session in which he/she practices relaxation exercises daily, gradually applying them progressively from non-anxiety-provoking to anxiety-provoking situations; review and reinforce success while providing corrective feedback toward improvement. Teach the client calming/relaxation skills (e.g., applied relaxation, progressive muscle relaxation, cue controlled relaxation; mindful breathing; biofeedback) and how to discriminate better between relaxation and tension; teach the client how to apply these skills to his/her daily life. 7. Recognize, accept, and cope with feelings of depression.  8. Reduce overall frequency, intensity, and duration of the anxiety so that daily functioning is not impaired.  9. Resolve the core conflict that is the source of anxiety.  10. Stabilize anxiety level while increasing ability to function on a daily basis.   Saharsh Sterling, LCSW

## 2021-07-01 ENCOUNTER — Ambulatory Visit (INDEPENDENT_AMBULATORY_CARE_PROVIDER_SITE_OTHER): Payer: Commercial Managed Care - PPO | Admitting: Adult Health

## 2021-07-01 ENCOUNTER — Encounter (INDEPENDENT_AMBULATORY_CARE_PROVIDER_SITE_OTHER): Payer: Self-pay | Admitting: Adult Health

## 2021-07-01 ENCOUNTER — Other Ambulatory Visit: Payer: Self-pay

## 2021-07-01 VITALS — BP 128/80 | HR 92 | Temp 97.8°F | Ht 62.0 in | Wt 218.0 lb

## 2021-07-01 DIAGNOSIS — R7303 Prediabetes: Secondary | ICD-10-CM

## 2021-07-01 DIAGNOSIS — E559 Vitamin D deficiency, unspecified: Secondary | ICD-10-CM

## 2021-07-01 DIAGNOSIS — Z6838 Body mass index (BMI) 38.0-38.9, adult: Secondary | ICD-10-CM

## 2021-07-01 DIAGNOSIS — Z9189 Other specified personal risk factors, not elsewhere classified: Secondary | ICD-10-CM | POA: Diagnosis not present

## 2021-07-01 DIAGNOSIS — E669 Obesity, unspecified: Secondary | ICD-10-CM | POA: Diagnosis not present

## 2021-07-01 DIAGNOSIS — R7401 Elevation of levels of liver transaminase levels: Secondary | ICD-10-CM

## 2021-07-01 DIAGNOSIS — E6609 Other obesity due to excess calories: Secondary | ICD-10-CM

## 2021-07-01 DIAGNOSIS — Z6839 Body mass index (BMI) 39.0-39.9, adult: Secondary | ICD-10-CM

## 2021-07-01 MED ORDER — METFORMIN HCL 500 MG PO TABS: 500.0000 mg | ORAL_TABLET | Freq: Every morning | ORAL | 0 refills | Status: DC

## 2021-07-01 NOTE — Progress Notes (Signed)
Chief Complaint:   OBESITY Sheena [name] is here to discuss her progress with her obesity treatment plan along with follow-up of her obesity related diagnoses. Sheena [name] is on the Category 2 Plan and states she is following her eating plan approximately 50% of the time. Sheena [name] states she is not exercising regularly.  Today's visit was #: 23 Starting weight: 250 lbs Starting date: 08/28/2020 Today's weight: 218 lbs Today's date: 07/01/2021 Total lbs lost to date: 32 lbs Total lbs lost since last in-office visit: 0  Interim History:  Sheena [name] states , right now, "I'm all over the board". 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 She recently established with a counselor - comfortable with new therapist- great!    She is smoking < pack per day. Declined tobacco cessation at this time.   Subjective:   1. Vitamin D deficiency She is currently taking prescription ergocalciferol 50,000 IU each week. She denies nausea, vomiting or muscle weakness.  2. Transaminitis She denies RUQ pain.  3. Prediabetes Started on metformin 500 mg daily on 09/12/2019 - tolerating well. Last several A1c checks - all at goal.  4. At risk for impaired function of liver Sheena [name] is at risk for impaired function of liver due to transaminitis.  Assessment/Plan:   1. Vitamin D deficiency Check vitamin D level today.  - VITAMIN D 25 Hydroxy (Vit-D Deficiency, Fractures)  2. Transaminitis Check labs today.  - Comprehensive metabolic panel  3. Prediabetes Refill metformin 500 mg daily, as per below. Check labs today.  - Hemoglobin A1c - Insulin, random - Vitamin B12 - Refill metFORMIN (GLUCOPHAGE) 500 MG tablet; Take 1 tablet (500 mg total) by mouth every morning.  Dispense: 30 tablet; Refill: 0  4. At risk for impaired function of liver Sheena [name] was given approximately 15 minutes of counseling today regarding prevention of impaired liver function. Sheena [name] was educated about her  risk of developing NASH or even liver failure and advised that the only proven treatment for NAFLD was weight loss of at least 5-10% of body weight.    5. Obesity with current BMI of 39.9  Sheena [name] is currently in the action stage of change. As such, her goal is to continue with weight loss efforts. She has agreed to the Category 2 Plan.   Exercise goals:  Increase daily walking.  Behavioral modification strategies: increasing lean protein intake, decreasing simple carbohydrates, meal planning and cooking strategies, keeping healthy foods in the home, and planning for success.  Sheena [name] has agreed to follow-up with our clinic in 6 weeks. She was informed of the importance of frequent follow-up visits to maximize her success with intensive lifestyle modifications for her multiple health conditions.   Sheena [name] was informed we would discuss her lab results at her next visit unless there is a critical issue that needs to be addressed sooner. Sheena [name] agreed to keep her next visit at the agreed upon time to discuss these results.  Objective:   Blood pressure 128/80, pulse 92, temperature 97.8 F (36.6 C), height 5\' 2"  (1.575 m), weight 218 lb (98.9 kg), last menstrual period 01/29/2017, SpO2 100 %. Body mass index is 39.87 kg/m.  General: Cooperative, alert, well developed, in no acute distress. HEENT: Conjunctivae and lids unremarkable. Cardiovascular: Regular rhythm.  Lungs: Normal work of breathing. Neurologic: No focal deficits.   Lab Results  Component Value Date   CREATININE 1.07 (H) 03/22/2021   BUN 18 03/22/2021   NA 141  03/22/2021   K 4.6 03/22/2021   CL 104 03/22/2021   CO2 24 03/22/2021   Lab Results  Component Value Date   ALT 22 03/22/2021   AST 28 03/22/2021   ALKPHOS 73 03/22/2021   BILITOT 0.3 03/22/2021   Lab Results  Component Value Date   HGBA1C 5.2 03/22/2021   HGBA1C 5.5 05/20/2020   HGBA1C 5.4 12/17/2019   HGBA1C 5.9 (H) 08/29/2019   Lab Results  Component Value  Date   INSULIN 9.9 03/22/2021   INSULIN 9.8 10/19/2020   INSULIN 14.0 05/20/2020   INSULIN 11.7 12/17/2019   INSULIN 15.0 08/29/2019   Lab Results  Component Value Date   TSH 0.054 (L) 12/17/2019   Lab Results  Component Value Date   CHOL 148 05/20/2020   HDL 46 05/20/2020   LDLCALC 82 05/20/2020   TRIG 113 05/20/2020   CHOLHDL 3.2 05/20/2020   Lab Results  Component Value Date   VD25OH 28.1 (L) 03/22/2021   VD25OH 49.0 05/20/2020   VD25OH 38.2 12/17/2019   Lab Results  Component Value Date   WBC 6.9 12/17/2019   HGB 13.1 12/17/2019   HCT 42.7 12/17/2019   MCV 80 12/17/2019   PLT 298 12/17/2019   Attestation Statements:   Reviewed by clinician on day of visit: allergies, medications, problem list, medical history, surgical history, family history, social history, and previous encounter notes.  I, Water quality scientist, CMA, am acting as Location manager for Mina Marble, NP.  I have reviewed the above documentation for accuracy and completeness, and I agree with the above. -  Zeanna Sunde d. Abdulhadi Stopa, NP-C

## 2021-07-02 LAB — VITAMIN D 25 HYDROXY (VIT D DEFICIENCY, FRACTURES): Vit D, 25-Hydroxy: 26.5 ng/mL — ABNORMAL LOW (ref 30.0–100.0)

## 2021-07-02 LAB — COMPREHENSIVE METABOLIC PANEL
ALT: 16 IU/L (ref 0–32)
AST: 20 IU/L (ref 0–40)
Albumin/Globulin Ratio: 1.6 (ref 1.2–2.2)
Albumin: 4.3 g/dL (ref 3.8–4.9)
Alkaline Phosphatase: 64 IU/L (ref 44–121)
BUN/Creatinine Ratio: 17 (ref 9–23)
BUN: 16 mg/dL (ref 6–24)
Bilirubin Total: 0.4 mg/dL (ref 0.0–1.2)
CO2: 23 mmol/L (ref 20–29)
Calcium: 9.5 mg/dL (ref 8.7–10.2)
Chloride: 104 mmol/L (ref 96–106)
Creatinine, Ser: 0.92 mg/dL (ref 0.57–1.00)
Globulin, Total: 2.7 g/dL (ref 1.5–4.5)
Glucose: 74 mg/dL (ref 70–99)
Potassium: 4.4 mmol/L (ref 3.5–5.2)
Sodium: 140 mmol/L (ref 134–144)
Total Protein: 7 g/dL (ref 6.0–8.5)
eGFR: 74 mL/min/{1.73_m2} (ref >59)

## 2021-07-02 LAB — INSULIN, RANDOM: INSULIN: 9.8 u[IU]/mL (ref 2.6–24.9)

## 2021-07-02 LAB — HEMOGLOBIN A1C
Est. average glucose Bld gHb Est-mCnc: 105 mg/dL
Hgb A1c MFr Bld: 5.3 % (ref 4.8–5.6)

## 2021-07-15 ENCOUNTER — Ambulatory Visit (INDEPENDENT_AMBULATORY_CARE_PROVIDER_SITE_OTHER): Payer: Commercial Managed Care - PPO | Admitting: Psychology

## 2021-07-15 DIAGNOSIS — F4323 Adjustment disorder with mixed anxiety and depressed mood: Secondary | ICD-10-CM | POA: Diagnosis not present

## 2021-07-15 NOTE — Progress Notes (Signed)
[place] Counselor/Therapist Progress Note  Patient ID: Sheena [name], MRN: [id number],    Date: 07/15/2021  Time Spent: 9:00am-10:00am  60 minutes   Treatment Type: Individual Therapy  Reported Symptoms: sadness, tearfulness  Mental Status Exam: Appearance:  Well Groomed     Behavior: Appropriate  Motor: Normal  Speech/Language:  Normal Rate  Affect: Appropriate and Tearful  Mood: sad  Thought process: normal  Thought content:   WNL  Sensory/Perceptual disturbances:   WNL  Orientation: oriented to person, place, time/date, and situation  Attention: Good  Concentration: Good  Memory: WNL  Fund of knowledge:  Good  Insight:   Good  Judgment:  Good  Impulse Control: Good   Risk Assessment: Danger to Self:  No Self-injurious Behavior: No Danger to Others: No Duty to Warn:no Physical Aggression / Violence:No  Access to Firearms a concern: No  Gang Involvement:No   Subjective:  Pt present for face-to-face individual therapy via video Webex.  Pt consents to telehealth video session due to COVID 19 pandemic. Location of pt: home. Location of therapist: home office. Pt talked about work.  She has had an office change that she is trying to get use to.  The change may ultimately be positive but is taking some adjustment to get use to.   Pt's department is short staffed.  Pt is working 2 caseloads.  Addressed the stress of working so many extra hours.    She has not had time to journal.    Pt feels overwhelmed.   Worked on Child psychotherapist.   Pt states yesterday would have been her parents 59th wedding anniversary.  In a couple of weeks it is the one year anniversary of her mother's death and the 2 year anniversary of her husband's death.  Pt talked about how it is still so hard to believe that her parents and husband are gone.  She misses hearing their voices.  Pt talked about the day her husband died.   She found him dead when she got home from work.  That  experience still bothers her.  Pt states her husband had sleep apnea and he would not wear his CPAP machine at night.  Pt does not know if her husband was reaching for help or in pain before he died and that really upsets her. Pt keeps wondering about if he was scared or suffered.   Helped pt process her feelings and grief.   Pt talked about having guilt that she feels like she did not do enough even though she did everything she possibly could regarding taking care of her husband and mother and job.  Pt has a lot of "what if" thoughts.   Pt tends to take on people's feelings and pain and does not know how to take care of herself and set healthy boundaries.  Worked with pt on self care and boundary setting.   Provided supportive therapy.      Interventions: Cognitive Behavioral Therapy, Grief Therapy, and Insight-Oriented  Diagnosis: F43.23  Plan:  Plan to meet in 2 weeks.   Pt is progressing toward goals.    Treatment Plan  (Target Date 06/08/22)  Client Abilities/Strengths   Pt is bright, engaging, and motivated for therapy.  Client Treatment Preferences   Individual therapy.  Client Statement of Needs   Improve copings skills and have safe place to talk.  Symptoms   Depressed or irritable mood. Excessive and/or unrealistic worry that is difficult to control occurring more days than  not for at least 6 months about a number of events or activities. Grief. Problems Addressed   Unipolar Depression, Anxiety Goals  1. Alleviate depressive symptoms and return to previous level of effective functioning.  2. Appropriately grieve the loss in order to normalize mood and to return to previously adaptive level of functioning.  Objective  Learn and implement behavioral strategies to overcome depression. Target Date: 2022-06-08 Frequency: Biweekly  Progress: 10 Modality: individual  Related Interventions  Assist the client in developing skills that increase the likelihood of deriving  pleasure from behavioral activation (e.g., assertiveness skills, developing an exercise plan, less internal/more external focus, increased social involvement); reinforce success. Engage the client in "behavioral activation," increasing his/her activity level and contact with sources of reward, while identifying processes that inhibit activation. use behavioral techniques such as instruction, rehearsal, role-playing, role reversal, as needed, to facilitate activity in the client's daily life; reinforce success. 3. Develop healthy interpersonal relationships that lead to the alleviation and help prevent the relapse of depression.  4. Develop healthy thinking patterns and beliefs about self, others, and the world that lead to the alleviation and help prevent the relapse of depression.  5. Enhance ability to effectively cope with the full variety of life's worries and anxieties.  6. Learn and implement coping skills that result in a reduction of anxiety and worry, and improved daily functioning.  Objective  Learn and implement problem-solving strategies for realistically addressing worries. Target Date: 2022-06-08 Frequency: Biweekly  Progress: 10 Modality: individual  Related Interventions  Assign the client a homework exercise in which he/she problem-solves a current problem.  review, reinforce success, and provide corrective feedback toward improvement. Teach the client problem-solving strategies involving specifically defining a problem, generating options for addressing it, evaluating the pros and cons of each option, selecting and implementing an optional action, and reevaluating and refining the action. Objective  Learn and implement calming skills to reduce overall anxiety and manage anxiety symptoms. Target Date: 2022-06-08 Frequency: Biweekly  Progress: 10 Modality: individual  Related Interventions  Assign the client to read about progressive muscle relaxation and other calming  strategies in relevant books or treatment manuals. Assign the client homework each session in which he/she practices relaxation exercises daily, gradually applying them progressively from non-anxiety-provoking to anxiety-provoking situations; review and reinforce success while providing corrective feedback toward improvement. Teach the client calming/relaxation skills (e.g., applied relaxation, progressive muscle relaxation, cue controlled relaxation; mindful breathing; biofeedback) and how to discriminate better between relaxation and tension; teach the client how to apply these skills to his/her daily life. 7. Recognize, accept, and cope with feelings of depression.  8. Reduce overall frequency, intensity, and duration of the anxiety so that daily functioning is not impaired.  9. Resolve the core conflict that is the source of anxiety.  10. Stabilize anxiety level while increasing ability to function on a daily basis.   Sumayya Muha, LCSW

## 2021-07-27 ENCOUNTER — Ambulatory Visit (INDEPENDENT_AMBULATORY_CARE_PROVIDER_SITE_OTHER): Payer: Commercial Managed Care - PPO | Admitting: Psychology

## 2021-07-27 DIAGNOSIS — F4323 Adjustment disorder with mixed anxiety and depressed mood: Secondary | ICD-10-CM

## 2021-07-27 NOTE — Progress Notes (Signed)
[facility] Counselor/Therapist Progress Note  Patient ID: Sheena [name], MRN: [id number],    Date: 07/27/2021  Time Spent: 2:00pm-3:00pm   60 minutes   Treatment Type: Individual Therapy  Reported Symptoms: sadness, tearfulness  Mental Status Exam: Appearance:  Well Groomed     Behavior: Appropriate  Motor: Normal  Speech/Language:  Normal Rate  Affect: Appropriate and Tearful  Mood: sad  Thought process: normal  Thought content:   WNL  Sensory/Perceptual disturbances:   WNL  Orientation: oriented to person, place, time/date, and situation  Attention: Good  Concentration: Good  Memory: WNL  Fund of knowledge:  Good  Insight:   Good  Judgment:  Good  Impulse Control: Good   Risk Assessment: Danger to Self:  No Self-injurious Behavior: No Danger to Others: No Duty to Warn:no Physical Aggression / Violence:No  Access to Firearms a concern: No  Gang Involvement:No   Subjective:  Pt present for face-to-face individual therapy via video Webex.  Pt consents to telehealth video session due to COVID 19 pandemic. Location of pt: home. Location of therapist: home office. Pt talked about work.    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   She is also training a couple of new staff.  Pt feels overwhelmed.   Pt thinks the next 3 months will continue to be overwhelming.  Worked on Child psychotherapist.   Pt talked about the anniversaries of her mother's and husband's deaths.   Pt has tried to stay busy to keep her mind off it.  Pt talked about how it is still so hard to believe that her parents and husband are gone.   Pt did some journaling about her feelings.  Pt wrote in her journal like she was writing her mother a Quarry manager.  Pt states she "went through hell" with her mother in 2019 and then she bounced back and hung on for a couple more years.  Pt wanted her mother to be able to go to the Hospice house to die.   But her mother ended up dying in the hospital.  Pt was there by  her mother's side but was not holding her mother's hand bc she did not know her mother was close to passing.   Pt feels guilty that she was not holding her mother's hand when she died.  Helped pt process her feelings and grief.   Pt wrote poems in her journal which helps her process her grief.   Pt talked about being "the most happy go lucky content person" before her parents and husband died.  Pt use to always have a good outlook.  Now she feels sad most of the time.   Pt states she lives in fear.   Pt is fearful about something happening to her son Mortimer Fries.  Pt has a strong attachment to her cats.  She worries excessively about them.  Pt has a heightened fear of losing people and pets she loves.  Pt is fearful of the pain.  She is fearful of decisions.  She feels like she "carries the weight of the world on her shoulders."  Pt states it's scary when she thinks about being so alone.  Pt feels like her happiness is "in the rear view mirror" and she is just surviving every day.   Helped pt process her feelings.  Worked on worry and Astronomer.  Addressed the multiple losses pt has had the past couple of years.   Pt's aunts, uncles, friend, mother, father and  husband all died.   Three of pt's cats died the past couple of years.  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   Pt has not been able to go back to church since her family's deaths.  She feels anger at God for her losses.   Recommended pt read the book "When Bad Things Happen to Good People".   Provided supportive therapy.      Interventions: Cognitive Behavioral Therapy, Grief Therapy, and Insight-Oriented  Diagnosis: F43.23  Plan:  Plan to meet in 2 weeks.   Pt is progressing toward goals.    Treatment Plan  (Target Date 06/08/22)  Client Abilities/Strengths   Pt is bright, engaging, and motivated for therapy.  Client Treatment Preferences   Individual therapy.  Client Statement of Needs   Improve copings skills and have  safe place to talk.  Symptoms   Depressed or irritable mood. Excessive and/or unrealistic worry that is difficult to control occurring more days than not for at least 6 months about a number of events or activities. Grief. Problems Addressed   Unipolar Depression, Anxiety Goals  1. Alleviate depressive symptoms and return to previous level of effective functioning.  2. Appropriately grieve the loss in order to normalize mood and to return to previously adaptive level of functioning.  Objective  Learn and implement behavioral strategies to overcome depression. Target Date: 2022-06-08 Frequency: Biweekly  Progress: 10 Modality: individual  Related Interventions  Assist the client in developing skills that increase the likelihood of deriving pleasure from behavioral activation (e.g., assertiveness skills, developing an exercise plan, less internal/more external focus, increased social involvement); reinforce success. Engage the client in "behavioral activation," increasing his/her activity level and contact with sources of reward, while identifying processes that inhibit activation. use behavioral techniques such as instruction, rehearsal, role-playing, role reversal, as needed, to facilitate activity in the client's daily life; reinforce success. 3. Develop healthy interpersonal relationships that lead to the alleviation and help prevent the relapse of depression.  4. Develop healthy thinking patterns and beliefs about self, others, and the world that lead to the alleviation and help prevent the relapse of depression.  5. Enhance ability to effectively cope with the full variety of life's worries and anxieties.  6. Learn and implement coping skills that result in a reduction of anxiety and worry, and improved daily functioning.  Objective  Learn and implement problem-solving strategies for realistically addressing worries. Target Date: 2022-06-08 Frequency: Biweekly  Progress: 10  Modality: individual  Related Interventions  Assign the client a homework exercise in which he/she problem-solves a current problem.  review, reinforce success, and provide corrective feedback toward improvement. Teach the client problem-solving strategies involving specifically defining a problem, generating options for addressing it, evaluating the pros and cons of each option, selecting and implementing an optional action, and reevaluating and refining the action. Objective  Learn and implement calming skills to reduce overall anxiety and manage anxiety symptoms. Target Date: 2022-06-08 Frequency: Biweekly  Progress: 10 Modality: individual  Related Interventions  Assign the client to read about progressive muscle relaxation and other calming strategies in relevant books or treatment manuals. Assign the client homework each session in which he/she practices relaxation exercises daily, gradually applying them progressively from non-anxiety-provoking to anxiety-provoking situations; review and reinforce success while providing corrective feedback toward improvement. Teach the client calming/relaxation skills (e.g., applied relaxation, progressive muscle relaxation, cue controlled relaxation; mindful breathing; biofeedback) and how to discriminate better between relaxation and tension; teach the client  how to apply these skills to his/her daily life. 7. Recognize, accept, and cope with feelings of depression.  8. Reduce overall frequency, intensity, and duration of the anxiety so that daily functioning is not impaired.  9. Resolve the core conflict that is the source of anxiety.  10. Stabilize anxiety level while increasing ability to function on a daily basis.   Olusegun Gerstenberger, LCSW

## 2021-08-11 ENCOUNTER — Ambulatory Visit (INDEPENDENT_AMBULATORY_CARE_PROVIDER_SITE_OTHER): Payer: Commercial Managed Care - PPO | Admitting: Psychology

## 2021-08-11 DIAGNOSIS — F4323 Adjustment disorder with mixed anxiety and depressed mood: Secondary | ICD-10-CM | POA: Diagnosis not present

## 2021-08-11 NOTE — Progress Notes (Signed)
[facility] Counselor/Therapist Progress Note  Patient ID: Sheena [name], MRN: [id number],    Date: 08/11/2021  Time Spent: 5:00pm-6:00pm   60 minutes   Treatment Type: Individual Therapy  Reported Symptoms: sadness  Mental Status Exam: Appearance:  Well Groomed     Behavior: Appropriate  Motor: Normal  Speech/Language:  Normal Rate  Affect: Appropriate and Tearful  Mood: sad  Thought process: normal  Thought content:   WNL  Sensory/Perceptual disturbances:   WNL  Orientation: oriented to person, place, time/date, and situation  Attention: Good  Concentration: Good  Memory: WNL  Fund of knowledge:  Good  Insight:   Good  Judgment:  Good  Impulse Control: Good   Risk Assessment: Danger to Self:  No Self-injurious Behavior: No Danger to Others: No Duty to Warn:no Physical Aggression / Violence:No  Access to Firearms a concern: No  Gang Involvement:No   Subjective:  Pt present for face-to-face individual therapy via video Webex.  Pt consents to telehealth video session due to COVID 19 pandemic. Location of pt: home. Location of therapist: home office. Pt states she has read the book "when bad things happen to good people".   She states the book is very helpful to her.   She is realizing that she has to change her perspective of how she is thinking about things.   Pt states she feels like she had a "light bulb moment" of insight. Pt states she was raised in the church but it does not serve her well to think "why did God do this to me?"   Pt realizes that God allows people to experience loss and it does not mean that she did anything to deserve it.   Pt states in order to heal and move forward she needs to change her thinking.  Pt is trying to make peace with her religion and faith in the midst of her losses.  Pt thinks this will also help her fears about "what will happen next".     Helped pt process her feelings and grief.   Pt talked about also working on  looking forward and ahead.   Pt wrote about what she would like to see in herself.  She feels like she has been "wallowing" in her grief.   She wants to move forward.   She would like to feel confident enough to go on a road trip by herself and visit family.  She has never done that before.   Pt would like to have a companion to spend time with and go out to eat with.   She can't see herself ever wanting to be married again.   She doesn't like being alone so much.   She would like to feel excited about the future and she has not felt that in a long time.   Pt does not see anything to look forward to.  Pt would like to be someone's priority.    Worked with pt on small steps she can take to create small things to look forward to.   Pt states that sometimes when she is sitting at home it almost feels comfortable to dwell on the losses and the grief.   Pt states that as long as she is "mired up in the grief" she is not forgetting about her loved ones.  She does not give herself permission to "be happy without them".   She feels guilty about feeling happy without them even though she knows they would want  her to be happy.   Helped pt process these issues and feelings.   Worked on self care strategies.   Provided supportive therapy.      Interventions: Cognitive Behavioral Therapy, Grief Therapy, and Insight-Oriented  Diagnosis: F43.23  Plan:  Plan to meet in 2 weeks.   Pt is progressing toward goals.    Treatment Plan  (Target Date 06/08/22)  Client Abilities/Strengths   Pt is bright, engaging, and motivated for therapy.  Client Treatment Preferences   Individual therapy.  Client Statement of Needs   Improve copings skills and have safe place to talk.  Symptoms   Depressed or irritable mood. Excessive and/or unrealistic worry that is difficult to control occurring more days than not for at least 6 months about a number of events or activities. Grief. Problems Addressed   Unipolar Depression,  Anxiety Goals  1. Alleviate depressive symptoms and return to previous level of effective functioning.  2. Appropriately grieve the loss in order to normalize mood and to return to previously adaptive level of functioning.  Objective  Learn and implement behavioral strategies to overcome depression. Target Date: 2022-06-08 Frequency: Biweekly  Progress: 10 Modality: individual  Related Interventions  Assist the client in developing skills that increase the likelihood of deriving pleasure from behavioral activation (e.g., assertiveness skills, developing an exercise plan, less internal/more external focus, increased social involvement); reinforce success. Engage the client in "behavioral activation," increasing his/her activity level and contact with sources of reward, while identifying processes that inhibit activation. use behavioral techniques such as instruction, rehearsal, role-playing, role reversal, as needed, to facilitate activity in the client's daily life; reinforce success. 3. Develop healthy interpersonal relationships that lead to the alleviation and help prevent the relapse of depression.  4. Develop healthy thinking patterns and beliefs about self, others, and the world that lead to the alleviation and help prevent the relapse of depression.  5. Enhance ability to effectively cope with the full variety of life's worries and anxieties.  6. Learn and implement coping skills that result in a reduction of anxiety and worry, and improved daily functioning.  Objective  Learn and implement problem-solving strategies for realistically addressing worries. Target Date: 2022-06-08 Frequency: Biweekly  Progress: 10 Modality: individual  Related Interventions  Assign the client a homework exercise in which he/she problem-solves a current problem.  review, reinforce success, and provide corrective feedback toward improvement. Teach the client problem-solving strategies involving  specifically defining a problem, generating options for addressing it, evaluating the pros and cons of each option, selecting and implementing an optional action, and reevaluating and refining the action. Objective  Learn and implement calming skills to reduce overall anxiety and manage anxiety symptoms. Target Date: 2022-06-08 Frequency: Biweekly  Progress: 10 Modality: individual  Related Interventions  Assign the client to read about progressive muscle relaxation and other calming strategies in relevant books or treatment manuals. Assign the client homework each session in which he/she practices relaxation exercises daily, gradually applying them progressively from non-anxiety-provoking to anxiety-provoking situations; review and reinforce success while providing corrective feedback toward improvement. Teach the client calming/relaxation skills (e.g., applied relaxation, progressive muscle relaxation, cue controlled relaxation; mindful breathing; biofeedback) and how to discriminate better between relaxation and tension; teach the client how to apply these skills to his/her daily life. 7. Recognize, accept, and cope with feelings of depression.  8. Reduce overall frequency, intensity, and duration of the anxiety so that daily functioning is not impaired.  9. Resolve the core conflict that is the source  of anxiety.  10. Stabilize anxiety level while increasing ability to function on a daily basis.   Shreyansh Tiffany, LCSW

## 2021-08-12 ENCOUNTER — Other Ambulatory Visit (INDEPENDENT_AMBULATORY_CARE_PROVIDER_SITE_OTHER): Payer: Self-pay | Admitting: Adult Health

## 2021-08-12 ENCOUNTER — Ambulatory Visit (INDEPENDENT_AMBULATORY_CARE_PROVIDER_SITE_OTHER): Payer: Commercial Managed Care - PPO | Admitting: Adult Health

## 2021-08-12 DIAGNOSIS — R7303 Prediabetes: Secondary | ICD-10-CM

## 2021-08-16 NOTE — Telephone Encounter (Signed)
** PT CXD 08/12/21 OV **   LAST APPOINTMENT DATE: 07/01/21 NEXT APPOINTMENT DATE: 09/01/21   [facility] [address], [place] [address] [place] [address] Phone: [phone] Fax: [phone]  [facility] [place], [place] [place] [address] Phone: [phone] Fax: [phone]  Patient is requesting a refill of the following medications: Pending Prescriptions:                       Disp   Refills   metFORMIN (GLUCOPHAGE) 500 MG tablet [Phar*30 tab*0       Sig: TAKE 1 TABLET BY MOUTH IN THE MORNING   Date last filled: 07/01/21 Previously prescribed by [facility]  Lab Results      Component                Value               Date                      HGBA1C                   5.3                 07/01/2021                HGBA1C                   5.2                 03/22/2021                HGBA1C                   5.5                 05/20/2020           Lab Results      Component                Value               Date                      LDLCALC                  82                  05/20/2020                CREATININE               0.92                07/01/2021           Lab Results      Component                Value               Date                      VD25OH                   26.5 (L)  07/01/2021                VD25OH                   28.1 (L)            03/22/2021                VD25OH                   49.0                05/20/2020            BP Readings from Last 3 Encounters: 07/01/21 : 128/80 06/01/21 : 123/75 04/21/21 : 134/81

## 2021-08-26 ENCOUNTER — Ambulatory Visit (INDEPENDENT_AMBULATORY_CARE_PROVIDER_SITE_OTHER): Payer: Commercial Managed Care - PPO | Admitting: Psychology

## 2021-08-26 DIAGNOSIS — F4323 Adjustment disorder with mixed anxiety and depressed mood: Secondary | ICD-10-CM

## 2021-08-26 NOTE — Progress Notes (Signed)
[facility] Counselor/Therapist Progress Note ? ?Patient ID: Sheena [name], MRN: [id number],   ? ?Date: 08/26/2021 ? ?Time Spent: 5:00pm-5:55pm   55 minutes  ? ?Treatment Type: Individual Therapy ? ?Reported Symptoms: sadness ? ?Mental Status Exam: ?Appearance:  Well Groomed     ?Behavior: Appropriate  ?Motor: Normal  ?Speech/Language:  Normal Rate  ?Affect: Appropriate and Tearful  ?Mood: sad  ?Thought process: normal  ?Thought content:   WNL  ?Sensory/Perceptual disturbances:   WNL  ?Orientation: oriented to person, place, time/date, and situation  ?Attention: Good  ?Concentration: Good  ?Memory: WNL  ?Fund of knowledge:  Good  ?Insight:   Good  ?Judgment:  Good  ?Impulse Control: Good  ? ?Risk Assessment: ?Danger to Self:  No ?Self-injurious Behavior: No ?Danger to Others: No ?Duty to Warn:no ?Physical Aggression / Violence:No  ?Access to Firearms a concern: No  ?Gang Involvement:No  ? ?Subjective:  ?Pt present for face-to-face individual therapy via video Webex.  Pt consents to telehealth video session due to COVID 19 pandemic. ?Location of pt: home. ?Location of therapist: home office. ?Pt talked about being so busy that she has not had time to journal.   ?She has been working on doing small things that she can look forward to.   She is going out to lunch with coworkers.   She is planning a trip to see her aunt in [place].   ?Pt states she is so overwhelmed with work and taxes and managing her parent's estate.   Pt feels tired and anxious.    ?Pt has been tearful at times bc of her overwhelm and grief. ?Pt talked about her job.  She is doing the work of 3 people and can't get everything done.  The stress at work is getting to her. ?Pt's son is not as helpful as she needs.   Pt feels like everything is on her shoulders.   ?Addressed if there is anything pt can release, delegate or give up.  Pt states she is the only one who can do the things she is responsible for.   ?Worked on self care strategies.   ?Pt states she is continuing to read the book "when bad things happen to good people".   She states the book is very helpful to her.   She is realizing that she has to change her perspective of how she is thinking about things.       ?Helped pt process these issues and feelings.    ?Pt has not been going to church bc that is the church where all the funerals were for her loved ones.   Pt wants to find a new church at some point and may consider going with a friend.   ?Pt talked about the stress of managing her parent's estate.  Pt has to plan an estate sale to sell the belongings and then will have to sell the houses. Pt has financial stress with paying the insurance, taxes, and utilities for 4 houses.   Addressed pt's financial worries.  Pt states her husband was a Ship broker and pt has to deal with managing all of his stuff as well.   ?Provided supportive therapy.     ? ?Interventions: Cognitive Behavioral Therapy, Grief Therapy, and Insight-Oriented ? ?Diagnosis: F43.23 ? ?Plan:  Plan to meet in 2 weeks.   Pt is progressing toward goals.   ? ?Treatment Plan  (Target Date 06/08/22) ? ?Client Abilities/Strengths  ? ?Pt is bright, engaging, and motivated for therapy.  ?  Client Treatment Preferences  ? ?Individual therapy.  ?Client Statement of Needs  ? ?Improve copings skills and have safe place to talk.  ?Symptoms  ? ?Depressed or irritable mood. Excessive and/or unrealistic worry that is difficult to control occurring more days than not for at least 6 months about a number of events or activities. Grief. ?Problems Addressed  ? ?Unipolar Depression, Anxiety ?Goals ? ?1. Alleviate depressive symptoms and return to previous level of effective functioning. ? ?2. Appropriately grieve the loss in order to normalize mood and to return to previously adaptive level of functioning. ? ?Objective ? ?Learn and implement behavioral strategies to overcome depression. ?Target Date: 2022-06-08 Frequency: Biweekly  ?Progress: 20  Modality: individual  ?Related Interventions ? ?Assist the client in developing skills that increase the likelihood of deriving pleasure from behavioral activation (e.g., assertiveness skills, developing an exercise plan, less internal/more external focus, increased social involvement); reinforce success. ?Engage the client in "behavioral activation," increasing his/her activity level and contact with sources of reward, while identifying processes that inhibit activation. use behavioral techniques such as instruction, rehearsal, role-playing, role reversal, as needed, to facilitate activity in the client's daily life; reinforce success. ?3. Develop healthy interpersonal relationships that lead to the alleviation and help prevent the relapse of depression. ? ?4. Develop healthy thinking patterns and beliefs about self, others, and the world that lead to the alleviation and help prevent the relapse of depression. ? ?5. Enhance ability to effectively cope with the full variety of life's worries and anxieties. ? ?6. Learn and implement coping skills that result in a reduction of anxiety and worry, and improved daily functioning. ? ?Objective ? ?Learn and implement problem-solving strategies for realistically addressing worries. ?Target Date: 2022-06-08 Frequency: Biweekly  ?Progress: 20 Modality: individual  ?Related Interventions ? ?Assign the client a homework exercise in which he/she problem-solves a current problem.  review, reinforce success, and provide corrective feedback toward improvement. ?Teach the client problem-solving strategies involving specifically defining a problem, generating options for addressing it, evaluating the pros and cons of each option, selecting and implementing an optional action, and reevaluating and refining the action. ?Objective ? ?Learn and implement calming skills to reduce overall anxiety and manage anxiety symptoms. ?Target Date: 2022-06-08 Frequency: Biweekly  ?Progress: 20  Modality: individual  ?Related Interventions ? ?Assign the client to read about progressive muscle relaxation and other calming strategies in relevant books or treatment manuals. ?Assign the client homework each session in which he/she practices relaxation exercises daily, gradually applying them progressively from non-anxiety-provoking to anxiety-provoking situations; review and reinforce success while providing corrective feedback toward improvement. ?Teach the client calming/relaxation skills (e.g., applied relaxation, progressive muscle relaxation, cue controlled relaxation; mindful breathing; biofeedback) and how to discriminate better between relaxation and tension; teach the client how to apply these skills to his/her daily life. ?7. Recognize, accept, and cope with feelings of depression. ? ?8. Reduce overall frequency, intensity, and duration of the anxiety so that daily functioning is not impaired. ? ?9. Resolve the core conflict that is the source of anxiety. ? ?10. Stabilize anxiety level while increasing ability to function on a daily basis. ? ? ?Trystin Terhune, LCSW ? ? ? ?

## 2021-09-01 ENCOUNTER — Other Ambulatory Visit: Payer: Self-pay

## 2021-09-01 ENCOUNTER — Ambulatory Visit (INDEPENDENT_AMBULATORY_CARE_PROVIDER_SITE_OTHER): Payer: Commercial Managed Care - PPO | Admitting: Adult Health

## 2021-09-01 ENCOUNTER — Encounter (INDEPENDENT_AMBULATORY_CARE_PROVIDER_SITE_OTHER): Payer: Self-pay | Admitting: Adult Health

## 2021-09-01 VITALS — BP 118/76 | HR 91 | Temp 97.5°F | Ht 62.0 in | Wt 220.0 lb

## 2021-09-01 DIAGNOSIS — E559 Vitamin D deficiency, unspecified: Secondary | ICD-10-CM | POA: Diagnosis not present

## 2021-09-01 DIAGNOSIS — Z6841 Body Mass Index (BMI) 40.0 and over, adult: Secondary | ICD-10-CM

## 2021-09-01 DIAGNOSIS — R7401 Elevation of levels of liver transaminase levels: Secondary | ICD-10-CM | POA: Diagnosis not present

## 2021-09-01 DIAGNOSIS — R7303 Prediabetes: Secondary | ICD-10-CM

## 2021-09-01 DIAGNOSIS — E6609 Other obesity due to excess calories: Secondary | ICD-10-CM

## 2021-09-01 DIAGNOSIS — E669 Obesity, unspecified: Secondary | ICD-10-CM

## 2021-09-01 DIAGNOSIS — Z9189 Other specified personal risk factors, not elsewhere classified: Secondary | ICD-10-CM | POA: Diagnosis not present

## 2021-09-01 MED ORDER — VITAMIN D (ERGOCALCIFEROL) 1.25 MG (50000 UNIT) PO CAPS: 50000.0000 [IU] | ORAL_CAPSULE | ORAL | 0 refills | Status: DC

## 2021-09-01 MED ORDER — METFORMIN HCL 500 MG PO TABS: 500.0000 mg | ORAL_TABLET | Freq: Every morning | ORAL | 0 refills | Status: DC

## 2021-09-02 NOTE — Progress Notes (Signed)
? ? ? ?Chief Complaint:  ? ?OBESITY ?Sheena [name] is here to discuss her progress with her obesity treatment plan along with follow-up of her obesity related diagnoses. Sheena [name] is on the Category 2 Plan and states she is following her eating plan approximately 70% of the time. Sheena [name] states she is not exercising regularly at this time. ? ?Today's visit was #: 24 ?Starting weight: 250 lbs ?Starting date: 08/28/2020 ?Today's weight: 220 lbs ?Today's date: 09/01/2021 ?Total lbs lost to date: 30 lbs ?Total lbs lost since last in-office visit: 0 ? 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   ?2) She is also training a new hire. ?3) She was denied time off today. ? ?[employer] - May 2023 - parents, great aunt, grandmother items in their respective homes. ? ?She endorses:  more stress eating, more reward eating, and less water intake. ? ?Subjective:  ? ?1. Transaminitis ?Discussed labs with patient today.  ?On 07/01/2021, CMP - LFTs - normal. ?She denies RUQ pain. ? ?2. Vitamin D deficiency ?Worsening.  Discussed labs with patient today.  ?On 07/01/2021, vitamin D level - 26.5 - well below goal of 50-70. ?She is currently taking prescription ergocalciferol 50,000 IU each week. She denies nausea, vomiting or muscle weakness.  Continues to be subtherapeutic. ? ?3. Prediabetes ?Discussed labs with patient today.  ?On 07/01/2021, CMP - GFR 74, A1c 5.3, highest A1c 5.9 on 08/29/2019.  ?On metformin 500 mg at breakfast - tolerating well. ? ?4. At risk for osteoporosis ?Sheena [name] is at higher risk of osteopenia and osteoporosis due to resistant Vitamin D deficiency and obesity.  ? ?Assessment/Plan:  ? ?1. Transaminitis ?Avoid hepatotoxic substances.  Monitor labs. ? ?2. Vitamin D deficiency ?Check labs every 2 months. ?Refill and increase ergocalciferol 50,000 IU to twice weekly. ? ?- Increase Vitamin D, Ergocalciferol, (DRISDOL) 1.25 MG (50000 UNIT) CAPS capsule; Take 1 capsule (50,000 Units total) by mouth  every 3 (three) days.  Dispense: 8 capsule; Refill: 0 ? ?3. Prediabetes ?Refill metformin 500 mg with breakfast. ?[facility] backup. ? ?- Refill metFORMIN (GLUCOPHAGE) 500 MG tablet; Take 1 tablet (500 mg total) by mouth every morning.  Dispense: 90 tablet; Refill: 0 ? ?4. At risk for osteoporosis ?Sheena [name] was given approximately 15 minutes of osteoporosis prevention counseling today. Sheena [name] is at risk for osteopenia and osteoporosis due to her Vitamin D deficiency. She was encouraged to take her Vit D and follow her higher calcium diet and increase strengthening exercise to help strengthen her bones and decrease her risk of osteopenia and osteoporosis. ? ?5. Obesity with current BMI of 40.3 ? ?[name] is currently in the action stage of change. As such, her goal is to continue with weight loss efforts. She has agreed to the Category 2 Plan.  ? ?1) Increase hydration ?- Diet green tea. ?- Camomile tea. ?- Watermelon juice. ? ?2) Wine - 8 oz = snack cal for that day. ? ?3) Decrease tobacco use (1/2 pack per day)- YOU CAN DO IT! ? ?Exercise goals: For substantial health benefits, adults should do at least 150 minutes (2 hours and 30 minutes) a week of moderate-intensity, or 75 minutes (1 hour and 15 minutes) a week of vigorous-intensity aerobic physical activity, or an equivalent combination of moderate- and vigorous-intensity aerobic activity. Aerobic activity should be performed in episodes of at least 10 minutes, and preferably, it should be spread throughout the week. ? ?Behavioral modification strategies: increasing lean protein intake, decreasing simple carbohydrates, increasing  water intake, meal planning and cooking strategies, keeping healthy foods in the home, and planning for success. ? ?Sheena [name] has agreed to follow-up with our clinic in 6 weeks. She was informed of the importance of frequent follow-up visits to maximize her success with intensive lifestyle modifications for her multiple health conditions.  ? ?Objective:   ? ?Blood pressure 118/76, pulse 91, temperature (!) 97.5 ?F (36.4 ?C), height '5\' 2"'$  (1.575 m), weight 220 lb (99.8 kg), last menstrual period 01/29/2017, SpO2 100 %. ?Body mass index is 40.24 kg/m?. ? ?General: Cooperative, alert, well developed, in no acute distress. ?HEENT: Conjunctivae and lids unremarkable. ?Cardiovascular: Regular rhythm.  ?Lungs: Normal work of breathing. ?Neurologic: No focal deficits.  ? ?Lab Results  ?Component Value Date  ? CREATININE 0.92 07/01/2021  ? BUN 16 07/01/2021  ? NA 140 07/01/2021  ? K 4.4 07/01/2021  ? CL 104 07/01/2021  ? CO2 23 07/01/2021  ? ?Lab Results  ?Component Value Date  ? ALT 16 07/01/2021  ? AST 20 07/01/2021  ? ALKPHOS 64 07/01/2021  ? BILITOT 0.4 07/01/2021  ? ?Lab Results  ?Component Value Date  ? HGBA1C 5.3 07/01/2021  ? HGBA1C 5.2 03/22/2021  ? HGBA1C 5.5 05/20/2020  ? HGBA1C 5.4 12/17/2019  ? HGBA1C 5.9 (H) 08/29/2019  ? ?Lab Results  ?Component Value Date  ? INSULIN 9.8 07/01/2021  ? INSULIN 9.9 03/22/2021  ? INSULIN 9.8 10/19/2020  ? INSULIN 14.0 05/20/2020  ? INSULIN 11.7 12/17/2019  ? ?Lab Results  ?Component Value Date  ? TSH 0.054 (L) 12/17/2019  ? ?Lab Results  ?Component Value Date  ? CHOL 148 05/20/2020  ? HDL 46 05/20/2020  ? [place] 82 05/20/2020  ? TRIG 113 05/20/2020  ? CHOLHDL 3.2 05/20/2020  ? ?Lab Results  ?Component Value Date  ? VD25OH 26.5 (L) 07/01/2021  ? VD25OH 28.1 (L) 03/22/2021  ? VD25OH 49.0 05/20/2020  ? ?Lab Results  ?Component Value Date  ? WBC 6.9 12/17/2019  ? HGB 13.1 12/17/2019  ? HCT 42.7 12/17/2019  ? MCV 80 12/17/2019  ? PLT 298 12/17/2019  ? ?Attestation Statements:  ? ?Reviewed by clinician on day of visit: allergies, medications, problem list, medical history, surgical history, family history, social history, and previous encounter notes. ? ?I, Water quality scientist, CMA, am acting as Location manager for Mina Marble, NP. ? ?I have reviewed the above documentation for accuracy and completeness, and I agree with the above. -  Iolanda Folson  d. Kylyn Sookram, NP-C ?

## 2021-09-08 ENCOUNTER — Ambulatory Visit (INDEPENDENT_AMBULATORY_CARE_PROVIDER_SITE_OTHER): Payer: Commercial Managed Care - PPO | Admitting: Psychology

## 2021-09-08 DIAGNOSIS — F4323 Adjustment disorder with mixed anxiety and depressed mood: Secondary | ICD-10-CM | POA: Diagnosis not present

## 2021-09-08 NOTE — Progress Notes (Signed)
[facility] Counselor/Therapist Progress Note ? ?Patient ID: Sheena [name], MRN: [id number],   ? ?Date: 09/08/2021 ? ?Time Spent: 5:00pm-6:00pm   60 minutes  ? ?Treatment Type: Individual Therapy ? ?Reported Symptoms: sadness ? ?Mental Status Exam: ?Appearance:  Well Groomed     ?Behavior: Appropriate  ?Motor: Normal  ?Speech/Language:  Normal Rate  ?Affect: Appropriate and Tearful  ?Mood: sad  ?Thought process: normal  ?Thought content:   WNL  ?Sensory/Perceptual disturbances:   WNL  ?Orientation: oriented to person, place, time/date, and situation  ?Attention: Good  ?Concentration: Good  ?Memory: WNL  ?Fund of knowledge:  Good  ?Insight:   Good  ?Judgment:  Good  ?Impulse Control: Good  ? ?Risk Assessment: ?Danger to Self:  No ?Self-injurious Behavior: No ?Danger to Others: No ?Duty to Warn:no ?Physical Aggression / Violence:No  ?Access to Firearms a concern: No  ?Gang Involvement:No  ? ?Subjective:  ?Pt present for face-to-face individual therapy via video Webex.  Pt consents to telehealth video session due to COVID 19 pandemic. ?Location of pt: home. ?Location of therapist: home office. ?Pt talked about work.   She continues to be very busy and work is stressful and overwhelming.    Pt state she is feeling more and more tired.   ?Pt has been going through her mother's belongings and has to get rid of a lot of her things which is hard for pt to do.  It is sad for pt to have to go through her mother's things and bring up memories.  Helped pt process her feelings and grief.  She states the hardest feelings for her to cope with are feelings of anger.   Pt feels anger at Herbie Baltimore and her mother bc they pressured her to spend time with them and she felt pulled in two directions.  Pt feels rage at the nursing home for the neglect in her mother's care.  Pt states it "breaks her heart" that her mother was not treated well at the nursing home.   ?Addressed how pt can communicate about her anger.   She has  started a letter to [place] to express her anger.   Pt's husband Herbie Baltimore had a terrible childhood and "had a lot of demons".  He was often depressed.  He did not want to go places and did not help pt with household responsibilities.  Herbie Baltimore was a Ship broker which created a lot of financial stress.  Pt felt like she was trying to raise her son and her husband.  Most of the home responsibilities were on pt's shoulders.  Pt feels angry at her husband and at herself.  ?Pt feels a lot of guilt about her mother being in the nursing even though pt visited her mother every day for 5 years.  Pt's mother was very demanding. ?Helped pt process her feelings. ?Pt feels like most of what she does is things she has to do and nobody can really help her with any of it.   ?Pt saw her PCP last week and she was so stressed that her blood pressure was elevated.  Addressed how pt's job and stress are negatively affecting her health.   Pt is going to try to engage in more self care and healthy habits such as exercising.   ?Worked on self care strategies.  ? ?Provided supportive therapy.     ? ?Interventions: Cognitive Behavioral Therapy, Grief Therapy, and Insight-Oriented ? ?Diagnosis: F43.23 ? ?Plan:  Plan to meet in 2 weeks.  Pt is progressing toward goals.   ? ?Treatment Plan  (Target Date 06/08/22) ? ?Client Abilities/Strengths  ? ?Pt is bright, engaging, and motivated for therapy.  ?Client Treatment Preferences  ? ?Individual therapy.  ?Client Statement of Needs  ? ?Improve copings skills and have safe place to talk.  ?Symptoms  ? ?Depressed or irritable mood. Excessive and/or unrealistic worry that is difficult to control occurring more days than not for at least 6 months about a number of events or activities. Grief. ?Problems Addressed  ? ?Unipolar Depression, Anxiety ?Goals ? ?1. Alleviate depressive symptoms and return to previous level of effective functioning. ? ?2. Appropriately grieve the loss in order to normalize mood and to  return to previously adaptive level of functioning. ? ?Objective ? ?Learn and implement behavioral strategies to overcome depression. ?Target Date: 2022-06-08 Frequency: Biweekly  ?Progress: 20 Modality: individual  ?Related Interventions ? ?Assist the client in developing skills that increase the likelihood of deriving pleasure from behavioral activation (e.g., assertiveness skills, developing an exercise plan, less internal/more external focus, increased social involvement); reinforce success. ?Engage the client in "behavioral activation," increasing his/her activity level and contact with sources of reward, while identifying processes that inhibit activation. use behavioral techniques such as instruction, rehearsal, role-playing, role reversal, as needed, to facilitate activity in the client's daily life; reinforce success. ?3. Develop healthy interpersonal relationships that lead to the alleviation and help prevent the relapse of depression. ? ?4. Develop healthy thinking patterns and beliefs about self, others, and the world that lead to the alleviation and help prevent the relapse of depression. ? ?5. Enhance ability to effectively cope with the full variety of life's worries and anxieties. ? ?6. Learn and implement coping skills that result in a reduction of anxiety and worry, and improved daily functioning. ? ?Objective ? ?Learn and implement problem-solving strategies for realistically addressing worries. ?Target Date: 2022-06-08 Frequency: Biweekly  ?Progress: 20 Modality: individual  ?Related Interventions ? ?Assign the client a homework exercise in which he/she problem-solves a current problem.  review, reinforce success, and provide corrective feedback toward improvement. ?Teach the client problem-solving strategies involving specifically defining a problem, generating options for addressing it, evaluating the pros and cons of each option, selecting and implementing an optional action, and reevaluating  and refining the action. ?Objective ? ?Learn and implement calming skills to reduce overall anxiety and manage anxiety symptoms. ?Target Date: 2022-06-08 Frequency: Biweekly  ?Progress: 20 Modality: individual  ?Related Interventions ? ?Assign the client to read about progressive muscle relaxation and other calming strategies in relevant books or treatment manuals. ?Assign the client homework each session in which he/she practices relaxation exercises daily, gradually applying them progressively from non-anxiety-provoking to anxiety-provoking situations; review and reinforce success while providing corrective feedback toward improvement. ?Teach the client calming/relaxation skills (e.g., applied relaxation, progressive muscle relaxation, cue controlled relaxation; mindful breathing; biofeedback) and how to discriminate better between relaxation and tension; teach the client how to apply these skills to his/her daily life. ?7. Recognize, accept, and cope with feelings of depression. ? ?8. Reduce overall frequency, intensity, and duration of the anxiety so that daily functioning is not impaired. ? ?9. Resolve the core conflict that is the source of anxiety. ? ?10. Stabilize anxiety level while increasing ability to function on a daily basis. ? ? ?Sarea Fyfe, LCSW ? ? ? ?

## 2021-10-12 ENCOUNTER — Ambulatory Visit (INDEPENDENT_AMBULATORY_CARE_PROVIDER_SITE_OTHER): Payer: Commercial Managed Care - PPO | Admitting: Psychology

## 2021-10-12 DIAGNOSIS — F4323 Adjustment disorder with mixed anxiety and depressed mood: Secondary | ICD-10-CM | POA: Diagnosis not present

## 2021-10-12 NOTE — Progress Notes (Signed)
[facility] Counselor/Therapist Progress Note ? ?Patient ID: Sheena [name], MRN: [id number],   ? ?Date: 10/12/2021 ? ?Time Spent: 5:00pm-5:55pm   55 minutes  ? ?Treatment Type: Individual Therapy ? ?Reported Symptoms: sadness ? ?Mental Status Exam: ?Appearance:  Well Groomed     ?Behavior: Appropriate  ?Motor: Normal  ?Speech/Language:  Normal Rate  ?Affect: Appropriate and Tearful  ?Mood: sad  ?Thought process: normal  ?Thought content:   WNL  ?Sensory/Perceptual disturbances:   WNL  ?Orientation: oriented to person, place, time/date, and situation  ?Attention: Good  ?Concentration: Good  ?Memory: WNL  ?Fund of knowledge:  Good  ?Insight:   Good  ?Judgment:  Good  ?Impulse Control: Good  ? ?Risk Assessment: ?Danger to Self:  No ?Self-injurious Behavior: No ?Danger to Others: No ?Duty to Warn:no ?Physical Aggression / Violence:No  ?Access to Firearms a concern: No  ?Gang Involvement:No  ? ?Subjective:  ?Pt present for face-to-face individual therapy via video Webex.  Pt consents to telehealth video session due to COVID 19 pandemic. ?Location of pt: home. ?Location of therapist: home office. ?Pt talked about work.   She continues to be very busy and work is stressful and overwhelming.    Worked on Child psychotherapist.  ?Pt has journaled about the stages of grief.   She identifies with sadness, anger, depression. ?Pt states she does not want grief to be her new identity.  The people pt lost were a part of her identity.  She was a wife and daughter and now she is a widow and orphan.   ?Pt still has to take care of her parents' estate which triggers her grief each time she goes through their belongings.   ?Pt does not have joy and contentment.   She has fear and dread.  Pt feels like she just exists.  Pt feels like her life is a void.  Pt states she does not know how to be happy by herself.   She spends time with her friends but they talk about their families and husbands which triggers pt's grief.  ?Pt talked  about her fear.  She does not know who to trust anymore.  When Robert died one of her second cousins warned her that people will try to take advantage of her bc she is a widow.  [place] houses and land.   People have approached pt trying to get her to sell her land.   She does not know who to trust.  She doesn't know what motives people have.   ?Pt's son Mortimer Fries is living in pt's grandmother's house.   A friend Alma Friendly) of Mortimer Fries is living with him and tries to come between pt and Bobby at times.  Alma Friendly can be manipulative and tries to do things to make pt look bad in front of [place].   ?Helped pt process her feelings and grief. ?Worked on self care strategies.  ?Provided supportive therapy.     ? ?Interventions: Cognitive Behavioral Therapy, Grief Therapy, and Insight-Oriented ? ?Diagnosis: F43.23 ? ?Plan:  Plan to meet in 2 weeks.   Pt is progressing toward goals.   ? ?Treatment Plan  (Target Date 06/08/22) ? ?Client Abilities/Strengths  ? ?Pt is bright, engaging, and motivated for therapy.  ?Client Treatment Preferences  ? ?Individual therapy.  ?Client Statement of Needs  ? ?Improve copings skills and have safe place to talk.  ?Symptoms  ? ?Depressed or irritable mood. Excessive and/or  unrealistic worry that is difficult to control occurring more days than not for at least 6 months about a number of events or activities. Grief. ?Problems Addressed  ? ?Unipolar Depression, Anxiety ?Goals ? ?1. Alleviate depressive symptoms and return to previous level of effective functioning. ? ?2. Appropriately grieve the loss in order to normalize mood and to return to previously adaptive level of functioning. ? ?Objective ? ?Learn and implement behavioral strategies to overcome depression. ?Target Date: 2022-06-08 Frequency: Biweekly  ?Progress: 20 Modality: individual  ?Related Interventions ? ?Assist the client in developing skills that increase the likelihood of deriving pleasure from  behavioral activation (e.g., assertiveness skills, developing an exercise plan, less internal/more external focus, increased social involvement); reinforce success. ?Engage the client in "behavioral activation," increasing his/her activity level and contact with sources of reward, while identifying processes that inhibit activation. use behavioral techniques such as instruction, rehearsal, role-playing, role reversal, as needed, to facilitate activity in the client's daily life; reinforce success. ?3. Develop healthy interpersonal relationships that lead to the alleviation and help prevent the relapse of depression. ? ?4. Develop healthy thinking patterns and beliefs about self, others, and the world that lead to the alleviation and help prevent the relapse of depression. ? ?5. Enhance ability to effectively cope with the full variety of life's worries and anxieties. ? ?6. Learn and implement coping skills that result in a reduction of anxiety and worry, and improved daily functioning. ? ?Objective ? ?Learn and implement problem-solving strategies for realistically addressing worries. ?Target Date: 2022-06-08 Frequency: Biweekly  ?Progress: 20 Modality: individual  ?Related Interventions ? ?Assign the client a homework exercise in which he/she problem-solves a current problem.  review, reinforce success, and provide corrective feedback toward improvement. ?Teach the client problem-solving strategies involving specifically defining a problem, generating options for addressing it, evaluating the pros and cons of each option, selecting and implementing an optional action, and reevaluating and refining the action. ?Objective ? ?Learn and implement calming skills to reduce overall anxiety and manage anxiety symptoms. ?Target Date: 2022-06-08 Frequency: Biweekly  ?Progress: 20 Modality: individual  ?Related Interventions ? ?Assign the client to read about progressive muscle relaxation and other calming strategies in  relevant books or treatment manuals. ?Assign the client homework each session in which he/she practices relaxation exercises daily, gradually applying them progressively from non-anxiety-provoking to anxiety-provoking situations; review and reinforce success while providing corrective feedback toward improvement. ?Teach the client calming/relaxation skills (e.g., applied relaxation, progressive muscle relaxation, cue controlled relaxation; mindful breathing; biofeedback) and how to discriminate better between relaxation and tension; teach the client how to apply these skills to his/her daily life. ?7. Recognize, accept, and cope with feelings of depression. ? ?8. Reduce overall frequency, intensity, and duration of the anxiety so that daily functioning is not impaired. ? ?9. Resolve the core conflict that is the source of anxiety. ? ?10. Stabilize anxiety level while increasing ability to function on a daily basis. ? ? ?Joselynne Killam, LCSW ? ? ? ?

## 2021-10-14 ENCOUNTER — Encounter (INDEPENDENT_AMBULATORY_CARE_PROVIDER_SITE_OTHER): Payer: Self-pay | Admitting: Adult Health

## 2021-10-14 ENCOUNTER — Ambulatory Visit (INDEPENDENT_AMBULATORY_CARE_PROVIDER_SITE_OTHER): Payer: Commercial Managed Care - PPO | Admitting: Adult Health

## 2021-10-14 VITALS — BP 103/68 | HR 86 | Temp 97.6°F | Ht 62.0 in | Wt 218.0 lb

## 2021-10-14 DIAGNOSIS — E6609 Other obesity due to excess calories: Secondary | ICD-10-CM

## 2021-10-14 DIAGNOSIS — Z6839 Body mass index (BMI) 39.0-39.9, adult: Secondary | ICD-10-CM | POA: Diagnosis not present

## 2021-10-14 DIAGNOSIS — R7303 Prediabetes: Secondary | ICD-10-CM | POA: Diagnosis not present

## 2021-10-14 DIAGNOSIS — Z9189 Other specified personal risk factors, not elsewhere classified: Secondary | ICD-10-CM

## 2021-10-14 DIAGNOSIS — E669 Obesity, unspecified: Secondary | ICD-10-CM

## 2021-10-14 DIAGNOSIS — E559 Vitamin D deficiency, unspecified: Secondary | ICD-10-CM | POA: Diagnosis not present

## 2021-10-14 MED ORDER — VITAMIN D (ERGOCALCIFEROL) 1.25 MG (50000 UNIT) PO CAPS: 50000.0000 [IU] | ORAL_CAPSULE | ORAL | 0 refills | Status: DC

## 2021-10-14 MED ORDER — METFORMIN HCL 500 MG PO TABS: 500.0000 mg | ORAL_TABLET | Freq: Every morning | ORAL | 0 refills | Status: DC

## 2021-10-19 ENCOUNTER — Ambulatory Visit
Admission: RE | Admit: 2021-10-19 | Discharge: 2021-10-19 | Disposition: A | Discharge status: Home / Self Care | Payer: Commercial Managed Care - PPO | Source: Ambulatory Visit | Attending: Endocrinology | Admitting: Endocrinology

## 2021-10-19 ENCOUNTER — Other Ambulatory Visit: Payer: Self-pay

## 2021-10-19 ENCOUNTER — Other Ambulatory Visit: Payer: Self-pay | Admitting: Endocrinology

## 2021-10-19 DIAGNOSIS — R140 Abdominal distension (gaseous): Secondary | ICD-10-CM

## 2021-10-19 DIAGNOSIS — R1011 Right upper quadrant pain: Secondary | ICD-10-CM

## 2021-10-19 MED ORDER — IOPAMIDOL (ISOVUE-300) INJECTION 61%
100.0000 mL | Freq: Once | INTRAVENOUS | Status: AC | PRN
  Administered 2021-10-19: 100 mL via INTRAVENOUS

## 2021-10-26 ENCOUNTER — Ambulatory Visit (INDEPENDENT_AMBULATORY_CARE_PROVIDER_SITE_OTHER): Payer: Commercial Managed Care - PPO | Admitting: Psychology

## 2021-10-26 DIAGNOSIS — F4323 Adjustment disorder with mixed anxiety and depressed mood: Secondary | ICD-10-CM

## 2021-10-26 NOTE — Progress Notes (Signed)
[facility] Counselor/Therapist Progress Note ? ?Patient ID: Sheena [name], MRN: [id number],   ? ?Date: 10/26/2021 ? ?Time Spent: 5:00pm-5:45pm   45 minutes  ? ?Treatment Type: Individual Therapy ? ?Reported Symptoms: sadness ? ?Mental Status Exam: ?Appearance:  Well Groomed     ?Behavior: Appropriate  ?Motor: Normal  ?Speech/Language:  Normal Rate  ?Affect: Appropriate and Tearful  ?Mood: sad  ?Thought process: normal  ?Thought content:   WNL  ?Sensory/Perceptual disturbances:   WNL  ?Orientation: oriented to person, place, time/date, and situation  ?Attention: Good  ?Concentration: Good  ?Memory: WNL  ?Fund of knowledge:  Good  ?Insight:   Good  ?Judgment:  Good  ?Impulse Control: Good  ? ?Risk Assessment: ?Danger to Self:  No ?Self-injurious Behavior: No ?Danger to Others: No ?Duty to Warn:no ?Physical Aggression / Violence:No  ?Access to Firearms a concern: No  ?Gang Involvement:No  ? ?Subjective:  ?Pt present for face-to-face individual therapy via video Webex.  Pt consents to telehealth video session due to COVID 19 pandemic. ?Location of pt: home. ?Location of therapist: home office. ?Pt talked about work.  She has been extremely busy.  Pt feels wiped out after her work days.  Addressed the stress and worked on Child psychotherapist.   ?Pt talked about her health.  She has colitis and has had some pain.  Addressed pt's health concerns and that they could be stress induced.   ?Pt talked about dreading next Sunday which is Mother's Day.   Pt talked to her son about plans and let him know what she would like.  Pt knows she will think about her mother who passed away.   ?Helped pt process her feelings and grief. ?Pt talked about her son Bobby's friend Alma Friendly who pt feels she can not trust.  Addressed the issues pt experiences with [place].  Helped pt process her feelings and relationship dynamics.   ?Pt talked about not thinking about the future much bc she does not see much for her.  She wants to be hopeful  but she can't see a scenario yet.  When she looks out into the future she sees more aloneness.   Pt has a hard time looking forward to anything bc so many beach trips and vacations were cancelled when her parents were sick.   ?Worked on self care strategies.  ?Provided supportive therapy.     ? ?Interventions: Cognitive Behavioral Therapy, Grief Therapy, and Insight-Oriented ? ?Diagnosis: F43.23 ? ?Plan:  Plan to meet in 2 weeks.   Pt is progressing toward goals.   ? ?Treatment Plan  (Target Date 06/08/22) ? ?Client Abilities/Strengths  ? ?Pt is bright, engaging, and motivated for therapy.  ?Client Treatment Preferences  ? ?Individual therapy.  ?Client Statement of Needs  ? ?Improve copings skills and have safe place to talk.  ?Symptoms  ? ?Depressed or irritable mood. Excessive and/or unrealistic worry that is difficult to control occurring more days than not for at least 6 months about a number of events or activities. Grief. ?Problems Addressed  ? ?Unipolar Depression, Anxiety ?Goals ? ?1. Alleviate depressive symptoms and return to previous level of effective functioning. ? ?2. Appropriately grieve the loss in order to normalize mood and to return to previously adaptive level of functioning. ? ?Objective ? ?Learn and implement behavioral strategies to overcome depression. ?Target Date: 2022-06-08 Frequency: Biweekly  ?Progress: 20 Modality: individual  ?Related Interventions ? ?Assist the client in developing skills that increase the likelihood of deriving pleasure from behavioral  activation (e.g., assertiveness skills, developing an exercise plan, less internal/more external focus, increased social involvement); reinforce success. ?Engage the client in "behavioral activation," increasing his/her activity level and contact with sources of reward, while identifying processes that inhibit activation. use behavioral techniques such as instruction, rehearsal, role-playing, role reversal, as needed, to facilitate  activity in the client's daily life; reinforce success. ?3. Develop healthy interpersonal relationships that lead to the alleviation and help prevent the relapse of depression. ? ?4. Develop healthy thinking patterns and beliefs about self, others, and the world that lead to the alleviation and help prevent the relapse of depression. ? ?5. Enhance ability to effectively cope with the full variety of life's worries and anxieties. ? ?6. Learn and implement coping skills that result in a reduction of anxiety and worry, and improved daily functioning. ? ?Objective ? ?Learn and implement problem-solving strategies for realistically addressing worries. ?Target Date: 2022-06-08 Frequency: Biweekly  ?Progress: 20 Modality: individual  ?Related Interventions ? ?Assign the client a homework exercise in which he/she problem-solves a current problem.  review, reinforce success, and provide corrective feedback toward improvement. ?Teach the client problem-solving strategies involving specifically defining a problem, generating options for addressing it, evaluating the pros and cons of each option, selecting and implementing an optional action, and reevaluating and refining the action. ?Objective ? ?Learn and implement calming skills to reduce overall anxiety and manage anxiety symptoms. ?Target Date: 2022-06-08 Frequency: Biweekly  ?Progress: 20 Modality: individual  ?Related Interventions ? ?Assign the client to read about progressive muscle relaxation and other calming strategies in relevant books or treatment manuals. ?Assign the client homework each session in which he/she practices relaxation exercises daily, gradually applying them progressively from non-anxiety-provoking to anxiety-provoking situations; review and reinforce success while providing corrective feedback toward improvement. ?Teach the client calming/relaxation skills (e.g., applied relaxation, progressive muscle relaxation, cue controlled relaxation; mindful  breathing; biofeedback) and how to discriminate better between relaxation and tension; teach the client how to apply these skills to his/her daily life. ?7. Recognize, accept, and cope with feelings of depression. ? ?8. Reduce overall frequency, intensity, and duration of the anxiety so that daily functioning is not impaired. ? ?9. Resolve the core conflict that is the source of anxiety. ? ?10. Stabilize anxiety level while increasing ability to function on a daily basis. ? ? ?Idaly Verret, LCSW ? ? ?

## 2021-10-26 NOTE — Progress Notes (Signed)
? ? ? ?Chief Complaint:  ? ?OBESITY ?Sheena [name] is here to discuss her progress with her obesity treatment plan along with follow-up of her obesity related diagnoses. Sheena [name] is on the Category 2 Plan and states she is following her eating plan approximately 75% of the time. Sheena [name] states she is doing resistance bands and yardwork for 60 minutes 2 times per week. ? ?Today's visit was #: 25 ?Starting weight: 250 lbs ?Starting date: 08/28/2020 ?Today's weight: 218 lbs ?Today's date: 10/14/2021 ?Total lbs lost to date: 32 lbs ?Total lbs lost since last in-office visit: 2 lbs ? ?Interim History:  ?Sheena [name]'s work continue to be a significant source of stress - working 50-55 per week.  ?She will see her son "Sheena [name]" few times a week. ?She and her son are working on [employer] - projected dates middle - late summer 2023.  She is liquidating her parents estate.  ? ?Subjective:  ? ?1. Prediabetes ?She has been on Metformin 500 every day.  ?07/01/21 GFR- 74 ?She denies family history of MTC or personal history of hx of pancreatitis. ? ?2. Vitamin D deficiency ?04/21/2022 Vitamin D level 26.5 - subtherapeutic.  ?Ergocalciferol increased 2 times week.  ?She denies nausea, vomiting, and muscle weakness. ? ?3. At risk for osteoporosis ?Sheena [name] is at risk for osteoporosis due to Vitamin D.  ? ?Assessment/Plan:  ? ?1. Prediabetes ?We will check labs at next office visit. We will refill Metformin 500 mg for 3 months with no refills.  ? ?- metFORMIN (GLUCOPHAGE) 500 MG tablet; Take 1 tablet (500 mg total) by mouth every morning.  Dispense: 90 tablet; Refill: 0 ? ?2. Vitamin D deficiency ?We will check labs at next office.  ?We will refill Vitamin D 50,000 IU for 1 month with no refills.  ? ?- Vitamin D, Ergocalciferol, (DRISDOL) 1.25 MG (50000 UNIT) CAPS capsule; Take 1 capsule (50,000 Units total) by mouth every 3 (three) days.  Dispense: 8 capsule; Refill: 0 ? ?3. At risk for osteoporosis ?Sheena [name] was given approximately 15 minutes of osteoporosis  prevention counseling today. Sheena [name] is at risk for osteopenia and osteoporosis due to her Vitamin D deficiency. She was encouraged to take her Vit D and follow her higher calcium diet and increase strengthening exercise to help strengthen her bones and decrease her risk of osteopenia and osteoporosis.  ? ?4. Obesity with current BMI of 39.9 ?Sheena [name] is currently in the action stage of change. As such, her goal is to continue with weight loss efforts. She has agreed to the Category 2 Plan.  ? ?We will check fasting labs at next office visit IC and she is aware to arrive 30 minutes early.  ? ?Exercise goals:  As is.  ? ?Behavioral modification strategies: increasing lean protein intake, decreasing simple carbohydrates, meal planning and cooking strategies, keeping healthy foods in the home, and planning for success. ? ?Sheena [name] has agreed to follow-up with our clinic in 4 weeks. She was informed of the importance of frequent follow-up visits to maximize her success with intensive lifestyle modifications for her multiple health conditions.  ? ?Objective:  ? ?Blood pressure 103/68, pulse 86, temperature 97.6 ?F (36.4 ?C), height '5\' 2"'$  (1.575 m), weight 218 lb (98.9 kg), last menstrual period 01/29/2017, SpO2 100 %. ?Body mass index is 39.87 kg/m?. ? ?General: Cooperative, alert, well developed, in no acute distress. ?HEENT: Conjunctivae and lids unremarkable. ?Cardiovascular: Regular rhythm.  ?Lungs: Normal work of breathing. ?Neurologic: No focal deficits.  ? ?Lab Results  ?Component Value Date  ?  CREATININE 0.92 07/01/2021  ? BUN 16 07/01/2021  ? NA 140 07/01/2021  ? K 4.4 07/01/2021  ? CL 104 07/01/2021  ? CO2 23 07/01/2021  ? ?Lab Results  ?Component Value Date  ? ALT 16 07/01/2021  ? AST 20 07/01/2021  ? ALKPHOS 64 07/01/2021  ? BILITOT 0.4 07/01/2021  ? ?Lab Results  ?Component Value Date  ? HGBA1C 5.3 07/01/2021  ? HGBA1C 5.2 03/22/2021  ? HGBA1C 5.5 05/20/2020  ? HGBA1C 5.4 12/17/2019  ? HGBA1C 5.9 (H) 08/29/2019  ? ?Lab  Results  ?Component Value Date  ? INSULIN 9.8 07/01/2021  ? INSULIN 9.9 03/22/2021  ? INSULIN 9.8 10/19/2020  ? INSULIN 14.0 05/20/2020  ? INSULIN 11.7 12/17/2019  ? ?Lab Results  ?Component Value Date  ? TSH 0.054 (L) 12/17/2019  ? ?Lab Results  ?Component Value Date  ? CHOL 148 05/20/2020  ? HDL 46 05/20/2020  ? [place] 82 05/20/2020  ? TRIG 113 05/20/2020  ? CHOLHDL 3.2 05/20/2020  ? ?Lab Results  ?Component Value Date  ? VD25OH 26.5 (L) 07/01/2021  ? VD25OH 28.1 (L) 03/22/2021  ? VD25OH 49.0 05/20/2020  ? ?Lab Results  ?Component Value Date  ? WBC 6.9 12/17/2019  ? HGB 13.1 12/17/2019  ? HCT 42.7 12/17/2019  ? MCV 80 12/17/2019  ? PLT 298 12/17/2019  ? ?No results found for: IRON, TIBC, FERRITIN ? ?Attestation Statements:  ? ?Reviewed by clinician on day of visit: allergies, medications, problem list, medical history, surgical history, family history, social history, and previous encounter notes. ? ?I, Sheena [name], RMA, am acting as Location manager for Mina Marble, NP. ? ?I have reviewed the above documentation for accuracy and completeness, and I agree with the above. - Sheena [name] d. [place], NP-C ?

## 2021-11-09 ENCOUNTER — Ambulatory Visit (INDEPENDENT_AMBULATORY_CARE_PROVIDER_SITE_OTHER): Payer: Commercial Managed Care - PPO | Admitting: Psychology

## 2021-11-09 DIAGNOSIS — F4323 Adjustment disorder with mixed anxiety and depressed mood: Secondary | ICD-10-CM

## 2021-11-09 NOTE — Progress Notes (Signed)
[facility] Counselor/Therapist Progress Note  Patient ID: PETRICE BEEDY, MRN: [id number],    Date: 11/09/2021  Time Spent: 5:00pm-5:55pm   55 minutes   Treatment Type: Individual Therapy  Reported Symptoms: sadness  Mental Status Exam: Appearance:  Well Groomed     Behavior: Appropriate  Motor: Normal  Speech/Language:  Normal Rate  Affect: Appropriate and Tearful  Mood: sad  Thought process: normal  Thought content:   WNL  Sensory/Perceptual disturbances:   WNL  Orientation: oriented to person, place, time/date, and situation  Attention: Good  Concentration: Good  Memory: WNL  Fund of knowledge:  Good  Insight:   Good  Judgment:  Good  Impulse Control: Good   Risk Assessment: Danger to Self:  No Self-injurious Behavior: No Danger to Others: No Duty to Warn:no Physical Aggression / Violence:No  Access to Firearms a concern: No  Gang Involvement:No   Subjective:  Pt present for face-to-face individual therapy via video Webex.  Pt consents to telehealth video session due to COVID 19 pandemic. Location of pt: home. Location of therapist: home office. Pt talked about work.  She has been extremely busy.  Pt feels wiped out after her work days.  Addressed the stress and worked on Child psychotherapist.   Pt talked about her health.  She sees the GI doctor tomorrow.  She is still having pain in her stomach but it is better than it was .   Pt talked about mother's day.  She states it was "bitter sweet".  She missed her mother terribly but her son Jolayne Haines came to her house and had dinner and watched movies with her.   Pt had a nice time with him.  Pt wrote a poem for her mother that was read at her mother's funeral and on the back of her memorial.  Pt and Jolayne Haines decided to have a couple of Sundays a month be time they can have dinner and spend together.   Yesterday was pt's deceased husband Robert's birthday.  He would have been 59.  It was the second birthday since he  passed away.  She talked to him and had a birthday "for him".   This Saturday would have been their 33rd wedding anniversary.   Pt states she has more good days than bad days now.   Pt has put things into practice that we have talked about in therapy.   She states it helps.  Pt states she now has moments and days when she "feels normal".   Pt feels like she can see herself getting through the grief which is something she has not felt before.   Pt is looking forward to her trip to [place] next week to see family.   Pt is driving there on her own for the first time.  Pt is excited about it but hopes she does ok with the 6 hour drive.   Pt wants to learn to be more independent.   Worked on self care strategies.  Provided supportive therapy.      Interventions: Cognitive Behavioral Therapy, Grief Therapy, and Insight-Oriented  Diagnosis: F43.23  Plan:  Plan to meet in 2 weeks.   Pt is progressing toward goals.    Treatment Plan  (Target Date 06/08/22)  Client Abilities/Strengths   Pt is bright, engaging, and motivated for therapy.  Client Treatment Preferences   Individual therapy.  Client Statement of Needs   Improve copings skills and have safe place to talk.  Symptoms   Depressed  or irritable mood. Excessive and/or unrealistic worry that is difficult to control occurring more days than not for at least 6 months about a number of events or activities. Grief. Problems Addressed   Unipolar Depression, Anxiety Goals  1. Alleviate depressive symptoms and return to previous level of effective functioning.  2. Appropriately grieve the loss in order to normalize mood and to return to previously adaptive level of functioning.  Objective  Learn and implement behavioral strategies to overcome depression. Target Date: 2022-06-08 Frequency: Biweekly  Progress: 20 Modality: individual  Related Interventions  Assist the client in developing skills that increase the likelihood of deriving  pleasure from behavioral activation (e.g., assertiveness skills, developing an exercise plan, less internal/more external focus, increased social involvement); reinforce success. Engage the client in "behavioral activation," increasing his/her activity level and contact with sources of reward, while identifying processes that inhibit activation. use behavioral techniques such as instruction, rehearsal, role-playing, role reversal, as needed, to facilitate activity in the client's daily life; reinforce success. 3. Develop healthy interpersonal relationships that lead to the alleviation and help prevent the relapse of depression.  4. Develop healthy thinking patterns and beliefs about self, others, and the world that lead to the alleviation and help prevent the relapse of depression.  5. Enhance ability to effectively cope with the full variety of life's worries and anxieties.  6. Learn and implement coping skills that result in a reduction of anxiety and worry, and improved daily functioning.  Objective  Learn and implement problem-solving strategies for realistically addressing worries. Target Date: 2022-06-08 Frequency: Biweekly  Progress: 20 Modality: individual  Related Interventions  Assign the client a homework exercise in which he/she problem-solves a current problem.  review, reinforce success, and provide corrective feedback toward improvement. Teach the client problem-solving strategies involving specifically defining a problem, generating options for addressing it, evaluating the pros and cons of each option, selecting and implementing an optional action, and reevaluating and refining the action. Objective  Learn and implement calming skills to reduce overall anxiety and manage anxiety symptoms. Target Date: 2022-06-08 Frequency: Biweekly  Progress: 20 Modality: individual  Related Interventions  Assign the client to read about progressive muscle relaxation and other calming  strategies in relevant books or treatment manuals. Assign the client homework each session in which he/she practices relaxation exercises daily, gradually applying them progressively from non-anxiety-provoking to anxiety-provoking situations; review and reinforce success while providing corrective feedback toward improvement. Teach the client calming/relaxation skills (e.g., applied relaxation, progressive muscle relaxation, cue controlled relaxation; mindful breathing; biofeedback) and how to discriminate better between relaxation and tension; teach the client how to apply these skills to his/her daily life. 7. Recognize, accept, and cope with feelings of depression.  8. Reduce overall frequency, intensity, and duration of the anxiety so that daily functioning is not impaired.  9. Resolve the core conflict that is the source of anxiety.  10. Stabilize anxiety level while increasing ability to function on a daily basis.   Belva Koziel, LCSW

## 2021-11-10 ENCOUNTER — Ambulatory Visit (INDEPENDENT_AMBULATORY_CARE_PROVIDER_SITE_OTHER): Payer: Commercial Managed Care - PPO | Admitting: Gastroenterology

## 2021-11-10 ENCOUNTER — Encounter: Payer: Self-pay | Admitting: Gastroenterology

## 2021-11-10 VITALS — BP 122/78 | HR 89 | Ht 62.0 in | Wt 219.6 lb

## 2021-11-10 DIAGNOSIS — R194 Change in bowel habit: Secondary | ICD-10-CM | POA: Diagnosis not present

## 2021-11-10 DIAGNOSIS — R933 Abnormal findings on diagnostic imaging of other parts of digestive tract: Secondary | ICD-10-CM | POA: Diagnosis not present

## 2021-11-10 DIAGNOSIS — K625 Hemorrhage of anus and rectum: Secondary | ICD-10-CM

## 2021-11-10 DIAGNOSIS — R195 Other fecal abnormalities: Secondary | ICD-10-CM | POA: Diagnosis not present

## 2021-11-10 NOTE — Progress Notes (Signed)
11/10/2021 Sheena [name] [id number] 06-26-1965   HISTORY OF PRESENT ILLNESS: This is a 56 year old female who is a patient of Dr. Lynne Leader.  She was actually seen here close to a year ago for complaints of rectal bleeding and it was recommended that she have a colonoscopy since her last was in 2015, which showed 3 small hyperplastic colon polyps.  She says that she had also been scheduled for knee replacement surgery and she thought she could have it done while she was off for that, but her surgeon told her that she should not have any type of surgery or procedures for at least 6 months.  Nonetheless, she canceled that colonoscopy.  Now she is here today with complaints of right-sided abdominal pain.  She says this started about a month ago.  She says that it was more right upper quadrant and was an aggravating pain.  Then one night she got a very strong cramp in that area.  She saw her PCP and they ordered a CT scan of the abdomen and pelvis with contrast as follows:   IMPRESSION: 1. Mild wall thickening with some adjacent inflammation involving the ascending colon, possibly reflecting a mild infectious or inflammatory colitis. 2. Normal appendix.  They told her to take a probiotic and they prescribed some antibiotics for her to take if it did not get better.  She says that it seemed to ease up and so she never use the antibiotics.  Overall everything last for about a wee.  She did have diarrhea during this time as well.  She says that now the pain is completely gone and she is about at her baseline.  Baseline bowel movements for her, however, have been very loose/mushy/watery stools.  Says she only goes once a day, but this has been a change over the last couple of years.  Past Medical History:  Diagnosis Date   ADHD    Anemia    Anxiety    Back pain    Chronic kidney disease    Depression    GERD (gastroesophageal reflux disease)    High cholesterol    IC (interstitial cystitis)     Interstitial cystitis    Knee pain, bilateral    Obesity    Osteoarthritis    PMB (postmenopausal bleeding)    Pre-diabetes    STD (sexually transmitted disease)    HSV ll   Thyroid disease    Hypothyroid   Vitamin D deficiency    Past Surgical History:  Procedure Laterality Date   CESAREAN SECTION     x 1   colonscopy     FOOT SURGERY Right    HYSTEROSCOPY WITH D & C N/A 10/31/2019   Procedure: DILATATION AND CURETTAGE /HYSTEROSCOPY;  Surgeon: Vanessa Kick, MD;  Location: [facility];  Service: Gynecology;  Laterality: N/A;   INJECTION KNEE Left 10/23/2019   versi knee gel injection and 10-30-19 to be done   REPLACEMENT TOTAL KNEE     WISDOM TOOTH EXTRACTION  2000    reports that she has been smoking cigarettes. She has a 20.00 pack-year smoking history. She has never used smokeless tobacco. She reports that she does not drink alcohol and does not use drugs. family history includes Breast cancer (age of onset: 78) in her cousin; Cancer in her father; Colon polyps in her father; Crohn's disease in her father; Diabetes in her father; Hyperlipidemia in her father and mother; Hypertension in her father; Melanoma in her  father; Obesity in her father and mother; Rheum arthritis in her mother; Thyroid disease in her father and mother. Allergies  Allergen Reactions   Sulfa Antibiotics Swelling      Outpatient Encounter Medications as of 11/10/2021  Medication Sig   ALPRAZolam (XANAX) 0.5 MG tablet 2 (two) times daily as needed.    DULoxetine (CYMBALTA) 30 MG capsule Take 1 capsule (30 mg total) by mouth daily. (Patient taking differently: Take 30 mg by mouth at bedtime.)   FENOFIBRATE PO Take 160 mg by mouth every evening.    ibuprofen (ADVIL) 800 MG tablet Take 800 mg by mouth 3 (three) times daily as needed.   levothyroxine (SYNTHROID) 175 MCG tablet Euthyrox 175 mcg tablet   lisdexamfetamine (VYVANSE) 30 MG capsule Take 30 mg by mouth daily.   metFORMIN (GLUCOPHAGE)  500 MG tablet Take 1 tablet (500 mg total) by mouth every morning.   Omeprazole (PRILOSEC PO) Take 20 mg by mouth daily.    TOPIRAMATE PO Take 50 mg by mouth daily.    traZODone (DESYREL) 50 MG tablet at bedtime.    Vitamin D, Ergocalciferol, (DRISDOL) 1.25 MG (50000 UNIT) CAPS capsule Take 1 capsule (50,000 Units total) by mouth every 3 (three) days.   No facility-administered encounter medications on file as of 11/10/2021.    REVIEW OF SYSTEMS  : All other systems reviewed and negative except where noted in the History of Present Illness.   PHYSICAL EXAM: BP 122/78   Pulse 89   Ht '5\' 2"'$  (1.575 m)   Wt 219 lb 9.6 oz (99.6 kg)   LMP 01/29/2017   SpO2 99%   BMI 40.17 kg/m  General: Well developed white female in no acute distress Head: Normocephalic and atraumatic Eyes:  Sclerae anicteric, conjunctiva pink. Ears: Normal auditory acuity Lungs: Clear throughout to auscultation; no W/R/R. Heart: Regular rate and rhythm; no M/R/G. Abdomen: Soft, non-distended.  BS present.  Non-tender. Rectal:  Will be done at the time of colonoscopy. Musculoskeletal: Symmetrical with no gross deformities  Skin: No lesions on visible extremities Extremities: No edema  Neurological: Alert oriented x 4, grossly non-focal Psychological:  Alert and cooperative. Normal mood and affect  ASSESSMENT AND PLAN: *Right upper quadrant abdominal pain with CT scan showing wall thickening of the ascending colon, question infectious versus inflammatory.  Acute symptoms resolved, but chronically she has loose stools for the past couple of years.  Suspect infectious, but rule out inflammatory colitis as well. *Rectal bleeding: Still sees small amount intermittently.  **We will plan for colonoscopy with Dr. Fuller Plan to look for any residual inflammation in the ascending colon or elsewhere that could indicate a chronic diagnosis of IBD.   CC:  Reynold Bowen, MD

## 2021-11-10 NOTE — Patient Instructions (Addendum)
You have been scheduled for a colonoscopy. Please follow written instructions given to you at your visit today.  Please pick up your prep supplies at the pharmacy within the next 1-3 days. If you use inhalers (even only as needed), please bring them with you on the day of your procedure. (Patient said she already has a Associate Professor at home)  Due to recent changes in healthcare laws, you may see the results of your imaging and laboratory studies on MyChart before your provider has had a chance to review them.  We understand that in some cases there may be results that are confusing or concerning to you. Not all laboratory results come back in the same time frame and the provider may be waiting for multiple results in order to interpret others.  Please give [place] 48 hours in order for your provider to thoroughly review all the results before contacting the office for clarification of your results.    If you are age 74 or older, your body mass index should be between 23-30. Your Body mass index is 40.17 kg/m. If this is out of the aforementioned range listed, please consider follow up with your Primary Care Provider.  If you are age 77 or younger, your body mass index should be between 19-25. Your Body mass index is 40.17 kg/m. If this is out of the aformentioned range listed, please consider follow up with your Primary Care Provider.   ________________________________________________________  The [facility] providers would like to encourage you to use [facility] to communicate with providers for non-urgent requests or questions.  Due to long hold times on the telephone, sending your provider a message by [facility] may be a faster and more efficient way to get a response.  Please allow 48 business hours for a response.  Please remember that this is for non-urgent requests.  _______________________________________________________   I appreciate the  opportunity to care for you  Thank You   Janett Billow Zehr,PA-C

## 2021-11-16 ENCOUNTER — Ambulatory Visit (INDEPENDENT_AMBULATORY_CARE_PROVIDER_SITE_OTHER): Payer: Commercial Managed Care - PPO | Admitting: Adult Health

## 2021-11-16 ENCOUNTER — Encounter (INDEPENDENT_AMBULATORY_CARE_PROVIDER_SITE_OTHER): Payer: Self-pay | Admitting: Adult Health

## 2021-11-16 VITALS — BP 113/71 | HR 95 | Temp 97.7°F | Ht 62.0 in | Wt 214.0 lb

## 2021-11-16 DIAGNOSIS — R7303 Prediabetes: Secondary | ICD-10-CM | POA: Diagnosis not present

## 2021-11-16 DIAGNOSIS — R7401 Elevation of levels of liver transaminase levels: Secondary | ICD-10-CM | POA: Diagnosis not present

## 2021-11-16 DIAGNOSIS — R0602 Shortness of breath: Secondary | ICD-10-CM

## 2021-11-16 DIAGNOSIS — E669 Obesity, unspecified: Secondary | ICD-10-CM

## 2021-11-16 DIAGNOSIS — E559 Vitamin D deficiency, unspecified: Secondary | ICD-10-CM

## 2021-11-16 DIAGNOSIS — Z9189 Other specified personal risk factors, not elsewhere classified: Secondary | ICD-10-CM

## 2021-11-16 DIAGNOSIS — Z6839 Body mass index (BMI) 39.0-39.9, adult: Secondary | ICD-10-CM

## 2021-11-17 LAB — LIPID PANEL WITH LDL/HDL RATIO
Cholesterol, Total: 165 mg/dL (ref 100–199)
HDL: 49 mg/dL (ref >39)
LDL Chol Calc (NIH): 86 mg/dL (ref 0–99)
LDL/HDL Ratio: 1.8 ratio (ref 0.0–3.2)
Triglycerides: 173 mg/dL — ABNORMAL HIGH (ref 0–149)
VLDL Cholesterol Cal: 30 mg/dL (ref 5–40)

## 2021-11-17 LAB — COMPREHENSIVE METABOLIC PANEL
ALT: 26 IU/L (ref 0–32)
AST: 28 IU/L (ref 0–40)
Albumin/Globulin Ratio: 1.5 (ref 1.2–2.2)
Albumin: 4.4 g/dL (ref 3.8–4.9)
Alkaline Phosphatase: 71 IU/L (ref 44–121)
BUN/Creatinine Ratio: 21 (ref 9–23)
BUN: 21 mg/dL (ref 6–24)
Bilirubin Total: 0.4 mg/dL (ref 0.0–1.2)
CO2: 26 mmol/L (ref 20–29)
Calcium: 10.2 mg/dL (ref 8.7–10.2)
Chloride: 102 mmol/L (ref 96–106)
Creatinine, Ser: 1 mg/dL (ref 0.57–1.00)
Globulin, Total: 3 g/dL (ref 1.5–4.5)
Glucose: 85 mg/dL (ref 70–99)
Potassium: 4.8 mmol/L (ref 3.5–5.2)
Sodium: 140 mmol/L (ref 134–144)
Total Protein: 7.4 g/dL (ref 6.0–8.5)
eGFR: 67 mL/min/{1.73_m2} (ref >59)

## 2021-11-17 LAB — VITAMIN B12: Vitamin B-12: 377 pg/mL (ref 232–1245)

## 2021-11-17 LAB — HEMOGLOBIN A1C
Est. average glucose Bld gHb Est-mCnc: 108 mg/dL
Hgb A1c MFr Bld: 5.4 % (ref 4.8–5.6)

## 2021-11-17 LAB — VITAMIN D 25 HYDROXY (VIT D DEFICIENCY, FRACTURES): Vit D, 25-Hydroxy: 64.2 ng/mL (ref 30.0–100.0)

## 2021-11-17 LAB — INSULIN, RANDOM: INSULIN: 13.5 u[IU]/mL (ref 2.6–24.9)

## 2021-11-17 NOTE — Progress Notes (Signed)
Chief Complaint:   OBESITY Sheena [name] is here to discuss her progress with her obesity treatment plan along with follow-up of her obesity related diagnoses. Sheena [name] is on the Category 2 Plan and states she is following her eating plan approximately 75% of the time. Sheena [name] states she is not currently exercising.  Today's visit was #: 26 Starting weight: 250 lbs Starting date: 08/28/2020 Today's weight: 214 lbs Today's date: 11/16/21 Total lbs lost to date: 36 lbs Total lbs lost since last in-office visit: -4  Interim History:  RMR today 2030  08/29/2019 RMR 2267.  She enjoys recumbent bike at the gym-however unable to go to gym due to increased work hours. She estimates to work 50 to 60 hours/week. 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   She will travel to [place] tomorrow until weekend to visit aunt.  She endorses polyphagia at night.  Subjective:   1. SOBOE (shortness of breath on exertion) She reports dyspnea with extreme exertion, however denies CP with exertion. She continues to use tobacco daily.  2. Vitamin D deficiency She is on ergocalciferol every 3 days-due to subtherapeutic levels on once weekly ergocalciferol.  3. Prediabetes She is on metformin 500 mg once daily. She will experience intermittent loose stools.  4. Transaminitis She experienced right upper quadrant pain in early May. 10/19/2021 CT Abdomen Pelvis W Contrast: IMPRESSION: 1. Mild wall thickening with some adjacent inflammation involving the ascending colon, possibly reflecting a mild infectious or inflammatory colitis. 2. Normal appendix.  11/10/21 OV with Gastroenterologist-colonoscopy recommended.  5. At risk for impaired function of liver Right upper quadrant pain, previous elevated LFTs.   Assessment/Plan:   1. SOBOE (shortness of breath on exertion) Check incentive calorimetry.  2. Vitamin D deficiency Check labs - VITAMIN D 25 Hydroxy (Vit-D Deficiency, Fractures)  3.  Prediabetes Check labs - Hemoglobin A1c - Vitamin B12  4. Transaminitis Check labs- Comprehensive metabolic panel Continue to avoid hepatotoxic substances.  5. At risk for impaired function of liver Sheena [name] was given approximately 15 minutes of counseling today regarding prevention of impaired liver function. Sheena [name] was educated about her risk of developing NASH or even liver failure and advised that the only proven treatment for NAFLD was weight loss of at least 5-10% of body weight.    6. Obesity, current BMI 39.2 Sheena [name] is currently in the action stage of change. As such, her goal is to continue with weight loss efforts. She has agreed to the Category 3 Plan.   Change from category 2 to category 3 meal plan.  Exercise goals: All adults should avoid inactivity. Some physical activity is better than none, and adults who participate in any amount of physical activity gain some health benefits.  Behavioral modification strategies: increasing lean protein intake, decreasing simple carbohydrates, meal planning and cooking strategies, keeping healthy foods in the home, and planning for success.  Crystall has agreed to follow-up with our clinic in 6 weeks. She was informed of the importance of frequent follow-up visits to maximize her success with intensive lifestyle modifications for her multiple health conditions.   Sheena [name] was informed we would discuss her lab results at her next visit unless there is a critical issue that needs to be addressed sooner. Sheena [name] agreed to keep her next visit at the agreed upon time to discuss these results.  Objective:   Blood pressure 113/71, pulse 95, temperature 97.7 F (36.5 C), height '5\' 2"'$  (1.575 m), weight 214 lb (97.1 kg), last menstrual period  01/29/2017, SpO2 99 %. Body mass index is 39.14 kg/m.  General: Cooperative, alert, well developed, in no acute distress. HEENT: Conjunctivae and lids unremarkable. Cardiovascular: Regular rhythm.  Lungs: Normal work of  breathing. Neurologic: No focal deficits.   Lab Results  Component Value Date   CREATININE 1.00 11/16/2021   BUN 21 11/16/2021   NA 140 11/16/2021   K 4.8 11/16/2021   CL 102 11/16/2021   CO2 26 11/16/2021   Lab Results  Component Value Date   ALT 26 11/16/2021   AST 28 11/16/2021   ALKPHOS 71 11/16/2021   BILITOT 0.4 11/16/2021   Lab Results  Component Value Date   HGBA1C 5.4 11/16/2021   HGBA1C 5.3 07/01/2021   HGBA1C 5.2 03/22/2021   HGBA1C 5.5 05/20/2020   HGBA1C 5.4 12/17/2019   Lab Results  Component Value Date   INSULIN 13.5 11/16/2021   INSULIN 9.8 07/01/2021   INSULIN 9.9 03/22/2021   INSULIN 9.8 10/19/2020   INSULIN 14.0 05/20/2020   Lab Results  Component Value Date   TSH 0.054 (L) 12/17/2019   Lab Results  Component Value Date   CHOL 165 11/16/2021   HDL 49 11/16/2021   LDLCALC 86 11/16/2021   TRIG 173 (H) 11/16/2021   CHOLHDL 3.2 05/20/2020   Lab Results  Component Value Date   VD25OH 64.2 11/16/2021   VD25OH 26.5 (L) 07/01/2021   VD25OH 28.1 (L) 03/22/2021   Lab Results  Component Value Date   WBC 6.9 12/17/2019   HGB 13.1 12/17/2019   HCT 42.7 12/17/2019   MCV 80 12/17/2019   PLT 298 12/17/2019   No results found for: IRON, TIBC, FERRITIN  Attestation Statements:   Reviewed by clinician on day of visit: allergies, medications, problem list, medical history, surgical history, family history, social history, and previous encounter notes.  I, Georgianne Fick, FNP, am acting as Location manager for Mina Marble, NP.  I have reviewed the above documentation for accuracy and completeness, and I agree with the above. -  Alessio Bogan d. Anayelli Lai, NP-C

## 2021-11-23 ENCOUNTER — Ambulatory Visit: Payer: Commercial Managed Care - PPO | Admitting: Psychology

## 2021-12-07 ENCOUNTER — Ambulatory Visit (INDEPENDENT_AMBULATORY_CARE_PROVIDER_SITE_OTHER): Payer: Commercial Managed Care - PPO | Admitting: Psychology

## 2021-12-07 DIAGNOSIS — F4323 Adjustment disorder with mixed anxiety and depressed mood: Secondary | ICD-10-CM | POA: Diagnosis not present

## 2021-12-07 NOTE — Progress Notes (Signed)
[place] Counselor/Therapist Progress Note  Patient ID: Sheena [name], MRN: [id number],    Date: 12/07/2021  Time Spent: 5:00pm-5:55pm   55 minutes   Treatment Type: Individual Therapy  Reported Symptoms: sadness  Mental Status Exam: Appearance:  Well Groomed     Behavior: Appropriate  Motor: Normal  Speech/Language:  Normal Rate  Affect: Appropriate and Tearful  Mood: sad  Thought process: normal  Thought content:   WNL  Sensory/Perceptual disturbances:   WNL  Orientation: oriented to person, place, time/date, and situation  Attention: Good  Concentration: Good  Memory: WNL  Fund of knowledge:  Good  Insight:   Good  Judgment:  Good  Impulse Control: Good   Risk Assessment: Danger to Self:  No Self-injurious Behavior: No Danger to Others: No Duty to Warn:no Physical Aggression / Violence:No  Access to Firearms a concern: No  Gang Involvement:No   Subjective:  Pt present for face-to-face individual therapy via video Webex.  Pt consents to telehealth video session due to COVID 19 pandemic. Location of pt: home. Location of therapist: home office. Pt talked about work.  She has been extremely busy.  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   Pt feels wiped out after her work days.  Addressed the stress and worked on Child psychotherapist.   Pt talked about her trip to [place].   She went on her own and everything went really well.  Pt liked driving by herself.  She was visiting her aunt and uncle and had a nice visit with them.  They are the only people remaining on her mom's side of the family.  Pt has always been close to her aunt. Pt's aunt has been a big support to pt since her mother died.   Pt and her aunt looked at pictures of pt's mother and pt could laugh at the good times.  Addressed how this is progress for pt.   Pt states she is coming to the realization that family is family and they will never be forgotten and will always be a part of  her life even after they pass. Pt talked about father's day being this past weekend.   She has some feelings of anger and jealousy that others got to be with their fathers and she could not.  Helped pt process her feelings.  Addressed how she took care of herself through that difficult day.   Pt is beginning to see a future for herself and feels like she is healing.  She feels like she has more good days than bad days now and does not feel hopeless anymore.  The more pt is learning to live by herself the more she likes it.  Pt feels like she has progressed enough that she feels comfortable changing her appointments to "as needed" instead of being regularly scheduled.   Provided supportive therapy.      Interventions: Cognitive Behavioral Therapy, Grief Therapy, and Insight-Oriented  Diagnosis: F43.23  Plan:  Pt participated in setting treatment goals.   Pt is progressing toward goals.     Treatment Plan  (Target Date 06/08/22)  Client Abilities/Strengths   Pt is bright, engaging, and motivated for therapy.  Client Treatment Preferences   Individual therapy.  Client Statement of Needs   Improve copings skills and have safe place to talk.  Symptoms   Depressed or irritable mood. Excessive and/or unrealistic worry that is difficult to control occurring more days than not for at least 6  months about a number of events or activities. Grief. Problems Addressed   Unipolar Depression, Anxiety Goals  1. Alleviate depressive symptoms and return to previous level of effective functioning.  2. Appropriately grieve the loss in order to normalize mood and to return to previously adaptive level of functioning.  Objective  Learn and implement behavioral strategies to overcome depression. Target Date: 2022-06-08 Frequency: Biweekly  Progress: 20 Modality: individual  Related Interventions  Assist the client in developing skills that increase the likelihood of deriving pleasure from behavioral  activation (e.g., assertiveness skills, developing an exercise plan, less internal/more external focus, increased social involvement); reinforce success. Engage the client in "behavioral activation," increasing his/her activity level and contact with sources of reward, while identifying processes that inhibit activation. use behavioral techniques such as instruction, rehearsal, role-playing, role reversal, as needed, to facilitate activity in the client's daily life; reinforce success. 3. Develop healthy interpersonal relationships that lead to the alleviation and help prevent the relapse of depression.  4. Develop healthy thinking patterns and beliefs about self, others, and the world that lead to the alleviation and help prevent the relapse of depression.  5. Enhance ability to effectively cope with the full variety of life's worries and anxieties.  6. Learn and implement coping skills that result in a reduction of anxiety and worry, and improved daily functioning.  Objective  Learn and implement problem-solving strategies for realistically addressing worries. Target Date: 2022-06-08 Frequency: Biweekly  Progress: 20 Modality: individual  Related Interventions  Assign the client a homework exercise in which he/she problem-solves a current problem.  review, reinforce success, and provide corrective feedback toward improvement. Teach the client problem-solving strategies involving specifically defining a problem, generating options for addressing it, evaluating the pros and cons of each option, selecting and implementing an optional action, and reevaluating and refining the action. Objective  Learn and implement calming skills to reduce overall anxiety and manage anxiety symptoms. Target Date: 2022-06-08 Frequency: Biweekly  Progress: 20 Modality: individual  Related Interventions  Assign the client to read about progressive muscle relaxation and other calming strategies in relevant books  or treatment manuals. Assign the client homework each session in which he/she practices relaxation exercises daily, gradually applying them progressively from non-anxiety-provoking to anxiety-provoking situations; review and reinforce success while providing corrective feedback toward improvement. Teach the client calming/relaxation skills (e.g., applied relaxation, progressive muscle relaxation, cue controlled relaxation; mindful breathing; biofeedback) and how to discriminate better between relaxation and tension; teach the client how to apply these skills to his/her daily life. 7. Recognize, accept, and cope with feelings of depression.  8. Reduce overall frequency, intensity, and duration of the anxiety so that daily functioning is not impaired.  9. Resolve the core conflict that is the source of anxiety.  10. Stabilize anxiety level while increasing ability to function on a daily basis.   Emigdio Wildeman, LCSW                Elpidia Karn [place], LCSW

## 2021-12-08 ENCOUNTER — Other Ambulatory Visit (INDEPENDENT_AMBULATORY_CARE_PROVIDER_SITE_OTHER): Payer: Self-pay | Admitting: Adult Health

## 2021-12-08 DIAGNOSIS — R7303 Prediabetes: Secondary | ICD-10-CM

## 2021-12-08 DIAGNOSIS — E559 Vitamin D deficiency, unspecified: Secondary | ICD-10-CM

## 2021-12-15 ENCOUNTER — Other Ambulatory Visit (INDEPENDENT_AMBULATORY_CARE_PROVIDER_SITE_OTHER): Payer: Self-pay | Admitting: Adult Health

## 2021-12-15 DIAGNOSIS — E559 Vitamin D deficiency, unspecified: Secondary | ICD-10-CM

## 2021-12-20 ENCOUNTER — Ambulatory Visit: Payer: Commercial Managed Care - PPO | Admitting: Psychology

## 2021-12-28 ENCOUNTER — Encounter (INDEPENDENT_AMBULATORY_CARE_PROVIDER_SITE_OTHER): Payer: Self-pay | Admitting: Adult Health

## 2021-12-28 ENCOUNTER — Ambulatory Visit (INDEPENDENT_AMBULATORY_CARE_PROVIDER_SITE_OTHER): Payer: Commercial Managed Care - PPO | Admitting: Adult Health

## 2021-12-28 VITALS — BP 112/70 | HR 95 | Temp 97.8°F | Ht 62.0 in | Wt 220.0 lb

## 2021-12-28 DIAGNOSIS — R7401 Elevation of levels of liver transaminase levels: Secondary | ICD-10-CM

## 2021-12-28 DIAGNOSIS — Z7984 Long term (current) use of oral hypoglycemic drugs: Secondary | ICD-10-CM

## 2021-12-28 DIAGNOSIS — E781 Pure hyperglyceridemia: Secondary | ICD-10-CM

## 2021-12-28 DIAGNOSIS — R7303 Prediabetes: Secondary | ICD-10-CM

## 2021-12-28 DIAGNOSIS — E669 Obesity, unspecified: Secondary | ICD-10-CM

## 2021-12-28 DIAGNOSIS — E559 Vitamin D deficiency, unspecified: Secondary | ICD-10-CM | POA: Diagnosis not present

## 2021-12-28 DIAGNOSIS — Z6841 Body Mass Index (BMI) 40.0 and over, adult: Secondary | ICD-10-CM

## 2021-12-28 DIAGNOSIS — F17200 Nicotine dependence, unspecified, uncomplicated: Secondary | ICD-10-CM

## 2021-12-28 DIAGNOSIS — Z9189 Other specified personal risk factors, not elsewhere classified: Secondary | ICD-10-CM

## 2021-12-28 DIAGNOSIS — F17210 Nicotine dependence, cigarettes, uncomplicated: Secondary | ICD-10-CM

## 2021-12-29 MED ORDER — BUPROPION HCL ER (SR) 100 MG PO TB12: 100.0000 mg | ORAL_TABLET | Freq: Every day | ORAL | 0 refills | Status: DC

## 2021-12-29 NOTE — Progress Notes (Signed)
Chief Complaint:   OBESITY Sheena [name] is here to discuss her progress with her obesity treatment plan along with follow-up of her obesity related diagnoses. Sheena [name] is on the Category 3 Plan and states she is following her eating plan approximately 75% of the time. Sheena [name] states she is doing 0 minutes 0 times per week.  Today's visit was #: 68 Starting weight: 250 lbs Starting date: 08/28/2020 Today's weight: 220 lbs Today's date: 12/28/2021 Total lbs lost to date: 30 Total lbs lost since last in-office visit: 0  Interim History:  Bioimpedance results reviewed were reviewed with the patient today:  Muscle mass -1.2 lbs Adipose mass +7.2 lbs Water weight +5.4 lbs.  Sheena [name] provided the following food recall that is typical for a day: Breakfast-boiled eggs and string cheese.   Lunch-eats out, pizza, [employer]  (burger) or [employer] (chicken).  - Lunch is largest meal of the day. Dinner-fruit, often watermelon.  Subjective:   1. Transaminitis Sheena [name]'s ALT/AST was previously elevated on 12/17/2019, 05/20/2020.   On 11/16/2021 CMP-LFTs were within normal limits.   I discussed labs with the patient today.  2. Prediabetes On 11/16/2021, CMP-GFR 67, blood glucose 85, A1c 5.4, and insulin level 9.8.   Sheena [name] is currently on metformin 500 mg once daily, and she denies GI upset.   I discussed labs with the patient today.  3. Vitamin D deficiency On 11/16/2021, Sheena [name]'s vitamin D level was 64.2.   She is on Ergocalciferol and she is tolerating it well.   I discussed labs with the patient today.  4. Hypertriglyceridemia Sheena [name] is drinking frozen margaritas 2-3 times per week.   Her ASCVD risk score is 3%, if she quits tobacco use her ASCVD risk score will decrease to 1.1%.   She is currently smoking 15 cigarettes/day.   Per patient, nicotine patch and nicotine gum both caused nausea.   She notes bupropion XL caused depression.   I discussed labs with the patient today.  5. Tobacco use disorder Sheena [name] is  currently smoking 15 cigarettes/day.   She has been smoking for > 35 years, and she stopped during her pregnancy.   She denies a history of seizure disorder. She denies SI/HI. Per patient, nicotine patch and nicotine gum both caused nausea.    6. At risk for heart disease Sheena [name] is at higher than average risk for cardiovascular disease due to obesity and tobacco use.   Assessment/Plan:   1. Transaminitis Sheena [name] is to avoid hepatotoxic substances.  2. Prediabetes Sheena [name] will continue metformin 500 mg once daily, no refill needed.  3. Vitamin D deficiency Sheena [name] is to stop Ergocalciferol and start OTC vitamin D3 2,000 IU once daily.  4. Hypertriglyceridemia Sheena [name] is to decrease her sugar and carbohydrate intake.  Increase regular exercise.  5. Tobacco use disorder Sheena [name] agreed to start bupropion SR 100 mg once daily #30, with no refills.  6. At risk for heart disease Sheena [name] was given approximately 15 minutes of coronary artery disease prevention counseling today. She is 56 y.o. female and has risk factors for heart disease including obesity. We discussed intensive lifestyle modifications today with an emphasis on specific weight loss instructions and strategies.  Repetitive spaced learning was employed today to elicit superior memory formation and behavioral change.   7. Obesity, current BMI 40.3 Sheena [name] is currently in the action stage of change. As such, her goal is to continue with weight loss efforts. She has agreed to the Category 3 Plan.   Exercise goals: As is.  Behavioral modification strategies: increasing lean protein intake, decreasing simple carbohydrates, meal planning and cooking strategies, keeping healthy foods in the home, and planning for success.  Sheena [name] has agreed to follow-up with our clinic in 6 weeks. She was informed of the importance of frequent follow-up visits to maximize her success with intensive lifestyle modifications for her multiple health conditions.    Objective:   Blood pressure 112/70, pulse 95, temperature 97.8 F (36.6 C), height '5\' 2"'$  (1.575 m), weight 220 lb (99.8 kg), last menstrual period 01/29/2017, SpO2 100 %. Body mass index is 40.24 kg/m.  General: Cooperative, alert, well developed, in no acute distress. HEENT: Conjunctivae and lids unremarkable. Cardiovascular: Regular rhythm.  Lungs: Normal work of breathing. Neurologic: No focal deficits.   Lab Results  Component Value Date   CREATININE 1.00 11/16/2021   BUN 21 11/16/2021   NA 140 11/16/2021   K 4.8 11/16/2021   CL 102 11/16/2021   CO2 26 11/16/2021   Lab Results  Component Value Date   ALT 26 11/16/2021   AST 28 11/16/2021   ALKPHOS 71 11/16/2021   BILITOT 0.4 11/16/2021   Lab Results  Component Value Date   HGBA1C 5.4 11/16/2021   HGBA1C 5.3 07/01/2021   HGBA1C 5.2 03/22/2021   HGBA1C 5.5 05/20/2020   HGBA1C 5.4 12/17/2019   Lab Results  Component Value Date   INSULIN 13.5 11/16/2021   INSULIN 9.8 07/01/2021   INSULIN 9.9 03/22/2021   INSULIN 9.8 10/19/2020   INSULIN 14.0 05/20/2020   Lab Results  Component Value Date   TSH 0.054 (L) 12/17/2019   Lab Results  Component Value Date   CHOL 165 11/16/2021   HDL 49 11/16/2021   LDLCALC 86 11/16/2021   TRIG 173 (H) 11/16/2021   CHOLHDL 3.2 05/20/2020   Lab Results  Component Value Date   VD25OH 64.2 11/16/2021   VD25OH 26.5 (L) 07/01/2021   VD25OH 28.1 (L) 03/22/2021   Lab Results  Component Value Date   WBC 6.9 12/17/2019   HGB 13.1 12/17/2019   HCT 42.7 12/17/2019   MCV 80 12/17/2019   PLT 298 12/17/2019   No results found for: "IRON", "TIBC", "FERRITIN"  Attestation Statements:   Reviewed by clinician on day of visit: allergies, medications, problem list, medical history, surgical history, family history, social history, and previous encounter notes.  Wilhemena Durie, am acting as transcriptionist for Mina Marble, NP.  I have reviewed the above documentation for  accuracy and completeness, and I agree with the above. -  Zilpha Mcandrew d. Nioka Thorington, NP-C

## 2022-01-04 ENCOUNTER — Encounter: Payer: Self-pay | Admitting: Gastroenterology

## 2022-01-07 ENCOUNTER — Ambulatory Visit (AMBULATORY_SURGERY_CENTER): Payer: Commercial Managed Care - PPO | Admitting: Gastroenterology

## 2022-01-07 ENCOUNTER — Encounter: Payer: Self-pay | Admitting: Gastroenterology

## 2022-01-07 VITALS — BP 125/65 | HR 78 | Temp 97.8°F | Resp 10 | Ht 62.0 in | Wt 219.0 lb

## 2022-01-07 DIAGNOSIS — D125 Benign neoplasm of sigmoid colon: Secondary | ICD-10-CM

## 2022-01-07 DIAGNOSIS — R933 Abnormal findings on diagnostic imaging of other parts of digestive tract: Secondary | ICD-10-CM | POA: Diagnosis present

## 2022-01-07 DIAGNOSIS — K5730 Diverticulosis of large intestine without perforation or abscess without bleeding: Secondary | ICD-10-CM | POA: Diagnosis not present

## 2022-01-07 DIAGNOSIS — K621 Rectal polyp: Secondary | ICD-10-CM

## 2022-01-07 DIAGNOSIS — K921 Melena: Secondary | ICD-10-CM | POA: Diagnosis not present

## 2022-01-07 DIAGNOSIS — K640 First degree hemorrhoids: Secondary | ICD-10-CM

## 2022-01-07 DIAGNOSIS — K635 Polyp of colon: Secondary | ICD-10-CM

## 2022-01-07 DIAGNOSIS — D128 Benign neoplasm of rectum: Secondary | ICD-10-CM

## 2022-01-07 DIAGNOSIS — D123 Benign neoplasm of transverse colon: Secondary | ICD-10-CM

## 2022-01-07 MED ORDER — SODIUM CHLORIDE 0.9 % IV SOLN: 500.0000 mL | Freq: Once | INTRAVENOUS | Status: DC

## 2022-01-07 NOTE — Progress Notes (Signed)
Pt's states no medical or surgical changes since previsit or office visit. 

## 2022-01-07 NOTE — Progress Notes (Signed)
Report to PACU, RN, vss, BBS= Clear.  

## 2022-01-07 NOTE — Progress Notes (Signed)
See 12/28/2021 H&P, no changes

## 2022-01-07 NOTE — Patient Instructions (Signed)
Information on polyps, diverticulosis and hemorrhoids given to you today.  Await pathology results.  Resume previous diet and medications.   YOU HAD AN ENDOSCOPIC PROCEDURE TODAY AT [facility]:   Refer to the procedure report that was given to you for any specific questions about what was found during the examination.  If the procedure report does not answer your questions, please call your gastroenterologist to clarify.  If you requested that your care partner not be given the details of your procedure findings, then the procedure report has been included in a sealed envelope for you to review at your convenience later.  YOU SHOULD EXPECT: Some feelings of bloating in the abdomen. Passage of more gas than usual.  Walking can help get rid of the air that was put into your GI tract during the procedure and reduce the bloating. If you had a lower endoscopy (such as a colonoscopy or flexible sigmoidoscopy) you may notice spotting of blood in your stool or on the toilet paper. If you underwent a bowel prep for your procedure, you may not have a normal bowel movement for a few days.  Please Note:  You might notice some irritation and congestion in your nose or some drainage.  This is from the oxygen used during your procedure.  There is no need for concern and it should clear up in a day or so.  SYMPTOMS TO REPORT IMMEDIATELY:  Following lower endoscopy (colonoscopy or flexible sigmoidoscopy):  Excessive amounts of blood in the stool  Significant tenderness or worsening of abdominal pains  Swelling of the abdomen that is new, acute  Fever of 100F or higher  For urgent or emergent issues, a gastroenterologist can be reached at any hour by calling [phone]. Do not use MyChart messaging for urgent concerns.    DIET:  We do recommend a small meal at first, but then you may proceed to your regular diet.  Drink plenty of fluids but you should avoid alcoholic beverages for 24  hours.  ACTIVITY:  You should plan to take it easy for the rest of today and you should NOT DRIVE or use heavy machinery until tomorrow (because of the sedation medicines used during the test).    FOLLOW UP: Our staff will call the number listed on your records the next business day following your procedure.  We will call around 7:15- 8:00 am to check on you and address any questions or concerns that you may have regarding the information given to you following your procedure. If we do not reach you, we will leave a message.  If you develop any symptoms (ie: fever, flu-like symptoms, shortness of breath, cough etc.) before then, please call [phone].  If you test positive for Covid 19 in the 2 weeks post procedure, please call and report this information to [place].    If any biopsies were taken you will be contacted by phone or by letter within the next 1-3 weeks.  Please call us at [phone] if you have not heard about the biopsies in 3 weeks.    SIGNATURES/CONFIDENTIALITY: You and/or your care partner have signed paperwork which will be entered into your electronic medical record.  These signatures attest to the fact that that the information above on your After Visit Summary has been reviewed and is understood.  Full responsibility of the confidentiality of this discharge information lies with you and/or your care-partner.

## 2022-01-07 NOTE — Progress Notes (Signed)
Called to room to assist during endoscopic procedure.  Patient ID and intended procedure confirmed with present staff. Received instructions for my participation in the procedure from the performing physician.  

## 2022-01-07 NOTE — Op Note (Signed)
[facility] Patient Name: Sheena [name] Procedure Date: 01/07/2022 10:56 AM MRN: [id number] Endoscopist: Ladene Artist , MD Age: 56 Referring MD:  Date of Birth: October 10, 1965 Gender: Female Account #: [id number] Procedure:                Colonoscopy Indications:              Hematochezia, Abnormal CT of the GI tract (colon) Medicines:                Monitored Anesthesia Care Procedure:                Pre-Anesthesia Assessment:                           - Prior to the procedure, a History and Physical                            was performed, and patient medications and                            allergies were reviewed. The patient's tolerance of                            previous anesthesia was also reviewed. The risks                            and benefits of the procedure and the sedation                            options and risks were discussed with the patient.                            All questions were answered, and informed consent                            was obtained. Prior Anticoagulants: The patient has                            taken no previous anticoagulant or antiplatelet                            agents. ASA Grade Assessment: III - A patient with                            severe systemic disease. After reviewing the risks                            and benefits, the patient was deemed in                            satisfactory condition to undergo the procedure.                           After obtaining informed consent, the colonoscope  was passed under direct vision. Throughout the                            procedure, the patient's blood pressure, pulse, and                            oxygen saturations were monitored continuously. The                            CF [id number] #[id number] was introduced through the anus                            and advanced to the the cecum, identified by                            appendiceal  orifice and ileocecal valve. The                            ileocecal valve, appendiceal orifice, and rectum                            were photographed. The quality of the bowel                            preparation was adequate. The colonoscopy was                            performed without difficulty. The patient tolerated                            the procedure well. Scope In: 11:07:15 AM Scope Out: 11:21:19 AM Scope Withdrawal Time: 0 hours 11 minutes 14 seconds  Total Procedure Duration: 0 hours 14 minutes 4 seconds  Findings:                 The perianal and digital rectal examinations were                            normal.                           Three sessile polyps were found in the rectum,                            sigmoid colon and transverse colon. The polyps were                            4 to 7 mm in size. These polyps were removed with a                            cold snare. Resection and retrieval were complete.                           A few small-mouthed diverticula were found in the  left colon. There was no evidence of diverticular                            bleeding.                           Internal hemorrhoids were found during                            retroflexion. The hemorrhoids were small and Grade                            I (internal hemorrhoids that do not prolapse).                           The exam was otherwise without abnormality on                            direct and retroflexion views. Complications:            No immediate complications. Estimated blood loss:                            None. Estimated Blood Loss:     Estimated blood loss: none. Impression:               - Three 4 to 7 mm polyps in the rectum, in the                            sigmoid colon and in the transverse colon, removed                            with a cold snare. Resected and retrieved.                           - Mild  diverticulosis in the left colon.                           - Internal hemorrhoids.                           - The examination was otherwise normal on direct                            and retroflexion views. Recommendation:           - Repeat colonoscopy after studies are complete for                            surveillance based on pathology results.                           - Patient has a contact number available for                            emergencies. The signs and symptoms of potential  delayed complications were discussed with the                            patient. Return to normal activities tomorrow.                            Written discharge instructions were provided to the                            patient.                           - High fiber diet.                           - Continue present medications.                           - Await pathology results. Ladene Artist, MD 01/07/2022 11:24:54 AM This report has been signed electronically.

## 2022-01-10 ENCOUNTER — Telehealth: Payer: Self-pay

## 2022-01-10 NOTE — Telephone Encounter (Signed)
  Follow up Call-     01/07/2022   10:44 AM  Call back number  Post procedure Call Back phone  # [phone]  Permission to leave phone message Yes     Patient questions:  Do you have a fever, pain , or abdominal swelling? No. Pain Score  0 *  Have you tolerated food without any problems? Yes.    Have you been able to return to your normal activities? Yes.    Do you have any questions about your discharge instructions: Diet   No. Medications  No. Follow up visit  No.  Do you have questions or concerns about your Care? No.  Actions: * If pain score is 4 or above: No action needed, pain <4.

## 2022-01-11 ENCOUNTER — Ambulatory Visit: Payer: Commercial Managed Care - PPO | Admitting: Psychology

## 2022-01-19 ENCOUNTER — Other Ambulatory Visit (INDEPENDENT_AMBULATORY_CARE_PROVIDER_SITE_OTHER): Payer: Self-pay | Admitting: Adult Health

## 2022-01-19 DIAGNOSIS — F17200 Nicotine dependence, unspecified, uncomplicated: Secondary | ICD-10-CM

## 2022-01-20 ENCOUNTER — Encounter: Payer: Self-pay | Admitting: Gastroenterology

## 2022-01-25 ENCOUNTER — Ambulatory Visit: Payer: Commercial Managed Care - PPO | Admitting: Psychology

## 2022-01-26 ENCOUNTER — Encounter (INDEPENDENT_AMBULATORY_CARE_PROVIDER_SITE_OTHER): Payer: Self-pay

## 2022-02-08 ENCOUNTER — Ambulatory Visit (INDEPENDENT_AMBULATORY_CARE_PROVIDER_SITE_OTHER): Payer: Commercial Managed Care - PPO | Admitting: Adult Health

## 2022-02-08 ENCOUNTER — Encounter (INDEPENDENT_AMBULATORY_CARE_PROVIDER_SITE_OTHER): Payer: Self-pay | Admitting: Adult Health

## 2022-02-08 VITALS — BP 117/75 | HR 89 | Temp 98.1°F | Ht 62.0 in | Wt 220.0 lb

## 2022-02-08 DIAGNOSIS — Z6841 Body Mass Index (BMI) 40.0 and over, adult: Secondary | ICD-10-CM

## 2022-02-08 DIAGNOSIS — F17200 Nicotine dependence, unspecified, uncomplicated: Secondary | ICD-10-CM | POA: Diagnosis not present

## 2022-02-08 DIAGNOSIS — E669 Obesity, unspecified: Secondary | ICD-10-CM

## 2022-02-10 ENCOUNTER — Ambulatory Visit: Payer: Commercial Managed Care - PPO | Admitting: Psychology

## 2022-02-11 NOTE — Progress Notes (Unsigned)
Chief Complaint:   OBESITY Sheena [name] is here to discuss her progress with her obesity treatment plan along with follow-up of her obesity related diagnoses. Sheena [name] is on the Category 3 Plan and states she is following her eating plan approximately 70% of the time. Sheena [name] states she is not exercising.  Today's visit was #: 28 Starting weight: 250 lbs Starting date: 08/28/2020 Today's weight: 220 lbs Today's date:  02/08/2022 Total lbs lost to date: 30 lbs Total lbs lost since last in-office visit: 0  Interim History: Started on Buproprion SR 1000 daily on 12/28/2021 after 3 weeks of therapy, she experienced therapy profound sadness, tearfulness.  She stopped Buproprion 1000 mg about 2 weeks ago.   Subjective:   1. Tobacco use disorder 12/28/2021 started on Buproprion SR 1000 mg daily.  She was unable to tolerate Buproprion SR 1000 mg.  Re:  sadness/depression.  She stopped on/about 01/25/2022.  She is decreasing tobacco use.  She is currently smoking 10-15 cigarettes per day.   Assessment/Plan:   1. Tobacco use disorder Remain off Buproprion SR 1000 mg.  Continue to reduce tobacco use.  2. Obesity, current BMI 40.2 Sheena [name] is currently in the action stage of change. As such, her goal is to continue with weight loss efforts. She has agreed to the Category 3 Plan.   Exercise goals: All adults should avoid inactivity. Some physical activity is better than none, and adults who participate in any amount of physical activity gain some health benefits.  Behavioral modification strategies: increasing lean protein intake, decreasing simple carbohydrates, meal planning and cooking strategies, keeping healthy foods in the home, and planning for success.  Sheena [name] has agreed to follow-up with our clinic in 6 weeks. She was informed of the importance of frequent follow-up visits to maximize her success with intensive lifestyle modifications for her multiple health conditions.   Objective:   Blood  pressure 117/75, pulse 89, temperature 98.1 F (36.7 C), height '5\' 2"'$  (1.575 m), weight 220 lb (99.8 kg), last menstrual period 01/29/2017, SpO2 98 %. Body mass index is 40.24 kg/m.  General: Cooperative, alert, well developed, in no acute distress. HEENT: Conjunctivae and lids unremarkable. Cardiovascular: Regular rhythm.  Lungs: Normal work of breathing. Neurologic: No focal deficits.   Lab Results  Component Value Date   CREATININE 1.00 11/16/2021   BUN 21 11/16/2021   NA 140 11/16/2021   K 4.8 11/16/2021   CL 102 11/16/2021   CO2 26 11/16/2021   Lab Results  Component Value Date   ALT 26 11/16/2021   AST 28 11/16/2021   ALKPHOS 71 11/16/2021   BILITOT 0.4 11/16/2021   Lab Results  Component Value Date   HGBA1C 5.4 11/16/2021   HGBA1C 5.3 07/01/2021   HGBA1C 5.2 03/22/2021   HGBA1C 5.5 05/20/2020   HGBA1C 5.4 12/17/2019   Lab Results  Component Value Date   INSULIN 13.5 11/16/2021   INSULIN 9.8 07/01/2021   INSULIN 9.9 03/22/2021   INSULIN 9.8 10/19/2020   INSULIN 14.0 05/20/2020   Lab Results  Component Value Date   TSH 0.054 (L) 12/17/2019   Lab Results  Component Value Date   CHOL 165 11/16/2021   HDL 49 11/16/2021   LDLCALC 86 11/16/2021   TRIG 173 (H) 11/16/2021   CHOLHDL 3.2 05/20/2020   Lab Results  Component Value Date   VD25OH 64.2 11/16/2021   VD25OH 26.5 (L) 07/01/2021   VD25OH 28.1 (L) 03/22/2021   Lab Results  Component Value Date  WBC 6.9 12/17/2019   HGB 13.1 12/17/2019   HCT 42.7 12/17/2019   MCV 80 12/17/2019   PLT 298 12/17/2019   No results found for: "IRON", "TIBC", "FERRITIN"  Attestation Statements:   Reviewed by clinician on day of visit: allergies, medications, problem list, medical history, surgical history, family history, social history, and previous encounter notes.  Time spent on visit including pre-visit chart review and post-visit care and charting was 28 minutes.   I, Davy Pique, RMA, am acting as  Location manager for Mina Marble, NP.  I have reviewed the above documentation for accuracy and completeness, and I agree with the above. -  ***

## 2022-03-06 ENCOUNTER — Other Ambulatory Visit (INDEPENDENT_AMBULATORY_CARE_PROVIDER_SITE_OTHER): Payer: Self-pay | Admitting: Adult Health

## 2022-03-06 DIAGNOSIS — R7303 Prediabetes: Secondary | ICD-10-CM

## 2022-03-24 ENCOUNTER — Encounter (INDEPENDENT_AMBULATORY_CARE_PROVIDER_SITE_OTHER): Payer: Self-pay | Admitting: Adult Health

## 2022-03-24 ENCOUNTER — Ambulatory Visit (INDEPENDENT_AMBULATORY_CARE_PROVIDER_SITE_OTHER): Payer: Commercial Managed Care - PPO | Admitting: Adult Health

## 2022-03-24 VITALS — BP 125/74 | HR 91 | Temp 98.0°F | Ht 62.0 in | Wt 219.0 lb

## 2022-03-24 DIAGNOSIS — Z6841 Body Mass Index (BMI) 40.0 and over, adult: Secondary | ICD-10-CM

## 2022-03-24 DIAGNOSIS — E559 Vitamin D deficiency, unspecified: Secondary | ICD-10-CM | POA: Diagnosis not present

## 2022-03-24 DIAGNOSIS — E669 Obesity, unspecified: Secondary | ICD-10-CM | POA: Diagnosis not present

## 2022-03-24 DIAGNOSIS — R7303 Prediabetes: Secondary | ICD-10-CM

## 2022-03-24 MED ORDER — VITAMIN D (ERGOCALCIFEROL) 1.25 MG (50000 UNIT) PO CAPS: 50000.0000 [IU] | ORAL_CAPSULE | ORAL | 0 refills | Status: AC

## 2022-03-24 MED ORDER — METFORMIN HCL 500 MG PO TABS: 500.0000 mg | ORAL_TABLET | Freq: Every morning | ORAL | 0 refills | Status: AC

## 2022-04-04 NOTE — Progress Notes (Unsigned)
Chief Complaint:   OBESITY Sheena [name] is here to discuss her progress with her obesity treatment plan along with follow-up of her obesity related diagnoses. Sheena [name] is on the Category 3 Plan and states she is following her eating plan approximately 70% of the time. Sheena [name] states she is doing housework 7 days per week.   Today's visit was #: 50 Starting weight: 250 lbs Starting date: 08/28/2020 Today's weight: 219 lbs Today's date: 03/24/2022 Total lbs lost to date: 31 lbs Total lbs lost since last in-office visit: 1 lb  Interim History: She is having an Advertising account executive for her mother's belongings.  She has been lugging boxes, furniture up and down stairs and across the street.  Annual physical with labs with Dr Forde Dandy on 02/15/2022, reviewed with patient today.   Subjective:   1. Prediabetes 02/15/2022, CMP GFR 58, A1c 5.2  2. Vitamin D deficiency 02/15/2022, Vitamin D level 22.1.  She is not currently taking any Vitamin D supplementation at this time.   Assessment/Plan:   1. Prediabetes Check labs at next office visit.   Refill - metFORMIN (GLUCOPHAGE) 500 MG tablet; Take 1 tablet (500 mg total) by mouth every morning.  Dispense: 90 tablet; Refill: 0  2. Vitamin D deficiency Start - Vitamin D, Ergocalciferol, (DRISDOL) 1.25 MG (50000 UNIT) CAPS capsule; Take 1 capsule (50,000 Units total) by mouth every 7 (seven) days.  Dispense: 8 capsule; Refill: 0  3. Obesity, current BMI 40.2 Check fasting labs at next office visit.   Sheena [name] is currently in the action stage of change. As such, her goal is to continue with weight loss efforts. She has agreed to the Category 2 Plan.   Exercise goals:  House work daily, heavy lifting.   Behavioral modification strategies: increasing lean protein intake, decreasing simple carbohydrates, meal planning and cooking strategies, keeping healthy foods in the home, and planning for success.  Sheena [name] has agreed to follow-up with our clinic in 6 weeks. She was  informed of the importance of frequent follow-up visits to maximize her success with intensive lifestyle modifications for her multiple health conditions.   Objective:   Blood pressure 125/74, pulse 91, temperature 98 F (36.7 C), height '5\' 2"'$  (1.575 m), weight 219 lb (99.3 kg), last menstrual period 01/29/2017, SpO2 100 %. Body mass index is 40.06 kg/m.  General: Cooperative, alert, well developed, in no acute distress. HEENT: Conjunctivae and lids unremarkable. Cardiovascular: Regular rhythm.  Lungs: Normal work of breathing. Neurologic: No focal deficits.   Lab Results  Component Value Date   CREATININE 1.00 11/16/2021   BUN 21 11/16/2021   NA 140 11/16/2021   K 4.8 11/16/2021   CL 102 11/16/2021   CO2 26 11/16/2021   Lab Results  Component Value Date   ALT 26 11/16/2021   AST 28 11/16/2021   ALKPHOS 71 11/16/2021   BILITOT 0.4 11/16/2021   Lab Results  Component Value Date   HGBA1C 5.4 11/16/2021   HGBA1C 5.3 07/01/2021   HGBA1C 5.2 03/22/2021   HGBA1C 5.5 05/20/2020   HGBA1C 5.4 12/17/2019   Lab Results  Component Value Date   INSULIN 13.5 11/16/2021   INSULIN 9.8 07/01/2021   INSULIN 9.9 03/22/2021   INSULIN 9.8 10/19/2020   INSULIN 14.0 05/20/2020   Lab Results  Component Value Date   TSH 0.054 (L) 12/17/2019   Lab Results  Component Value Date   CHOL 165 11/16/2021   HDL 49 11/16/2021   LDLCALC 86 11/16/2021   TRIG  173 (H) 11/16/2021   CHOLHDL 3.2 05/20/2020   Lab Results  Component Value Date   VD25OH 64.2 11/16/2021   VD25OH 26.5 (L) 07/01/2021   VD25OH 28.1 (L) 03/22/2021   Lab Results  Component Value Date   WBC 6.9 12/17/2019   HGB 13.1 12/17/2019   HCT 42.7 12/17/2019   MCV 80 12/17/2019   PLT 298 12/17/2019   No results found for: "IRON", "TIBC", "FERRITIN"  Attestation Statements:   Reviewed by clinician on day of visit: allergies, medications, problem list, medical history, surgical history, family history, social history,  and previous encounter notes.  I, Davy Pique, RMA, am acting as Location manager for Mina Marble, NP.  I have reviewed the above documentation for accuracy and completeness, and I agree with the above. -  ***

## 2022-05-23 ENCOUNTER — Ambulatory Visit (INDEPENDENT_AMBULATORY_CARE_PROVIDER_SITE_OTHER): Payer: Commercial Managed Care - PPO | Admitting: Physician Assistant

## 2023-02-08 IMAGING — CT CT ABD-PELV W/ CM
1 of 3 series · 12 of 32 positions shown, 18 images · IV contrast (APPLIED)
Comparison: No recent relevant priors available for comparison at
time dictation.

CLINICAL DATA: Two weeks of right upper quadrant pain with bloating
and pressure.

EXAM:
CT ABDOMEN AND PELVIS WITH CONTRAST
TECHNIQUE: Multidetector CT imaging of the abdomen and pelvis was performed
using the standard protocol following bolus administration of
intravenous contrast.

[Series 2: abd/pelvis w/cm · axial · 0.94mm/px · z∈[-606,-191]mm · 12 of 99 slices shown, 18 images]
[im 8/99  soft-tissue]
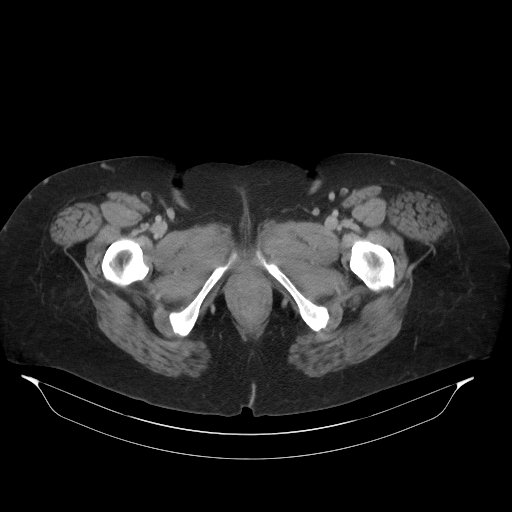
[im 8/99  bone]
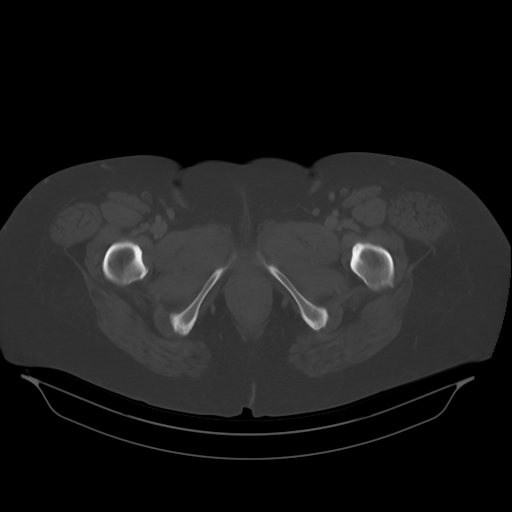
[im 16/99  soft-tissue]
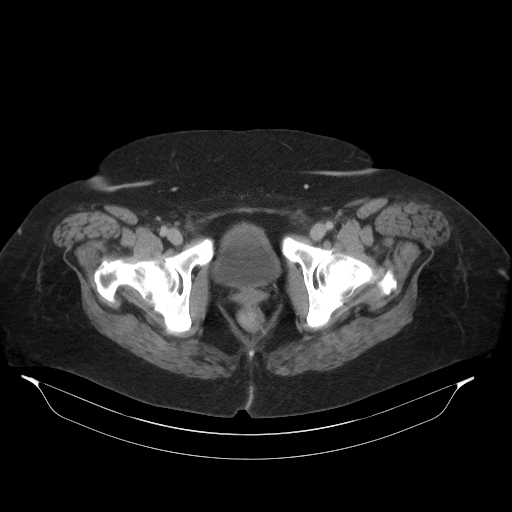
[im 23/99  soft-tissue]
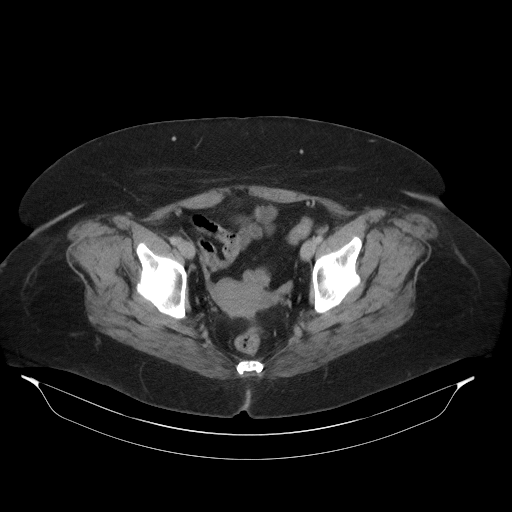
[im 31/99  soft-tissue]
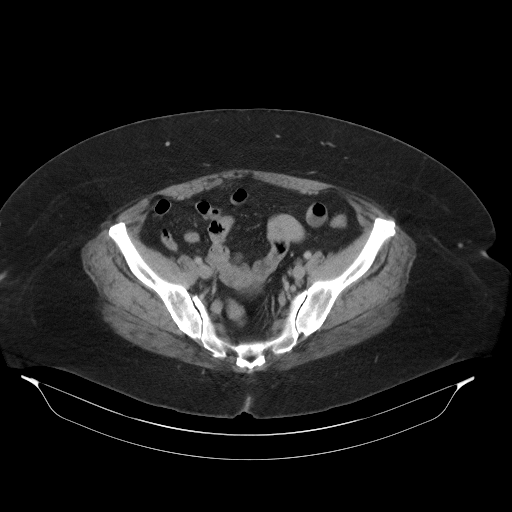
[im 38/99  soft-tissue]
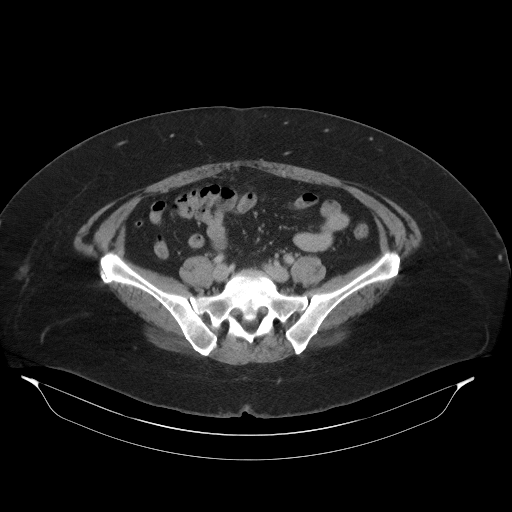
[im 46/99  soft-tissue]
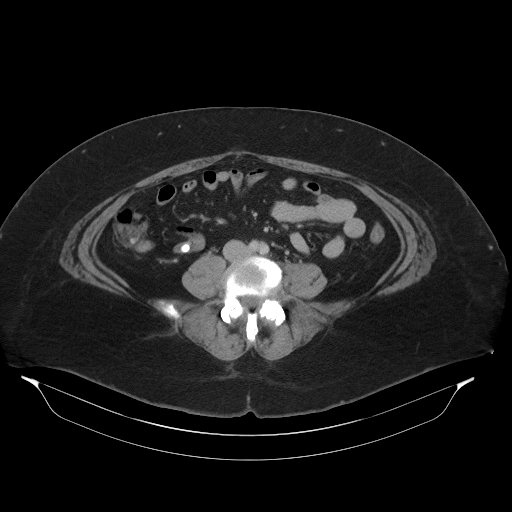
[im 53/99  soft-tissue]
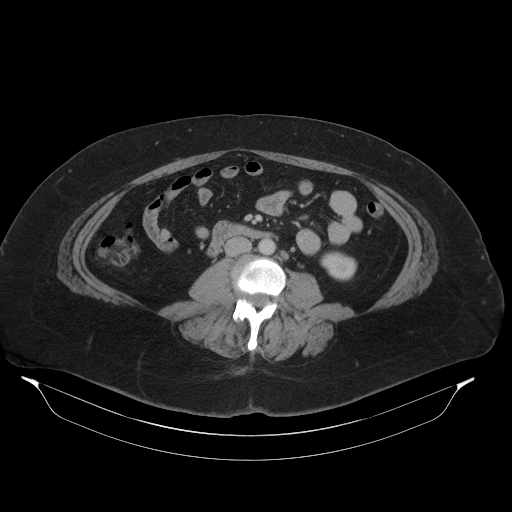
[im 61/99  soft-tissue]
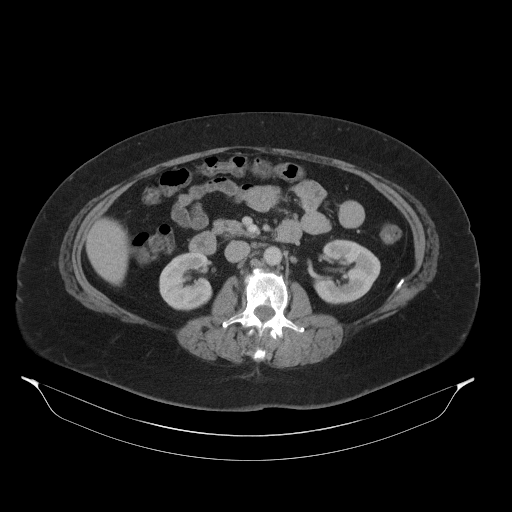
[im 68/99  soft-tissue]
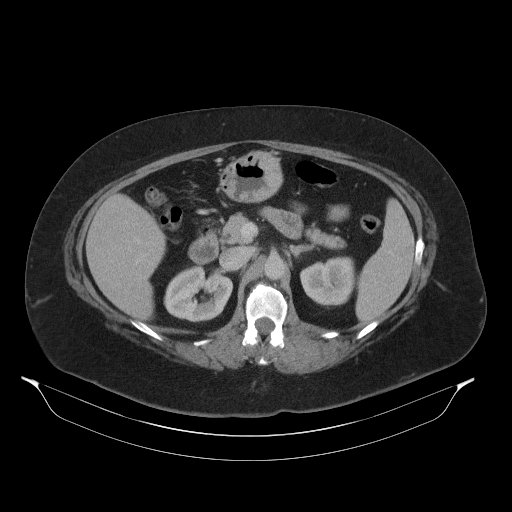
[im 68/99  lung]
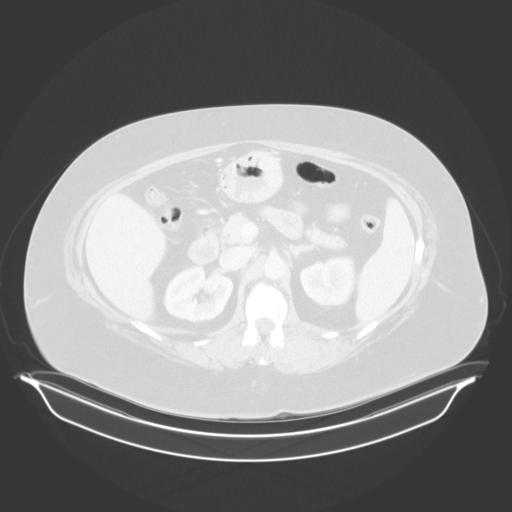
[im 68/99  bone]
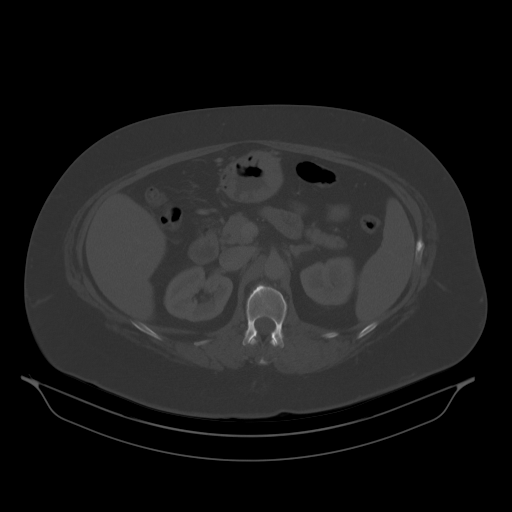
[im 76/99  soft-tissue]
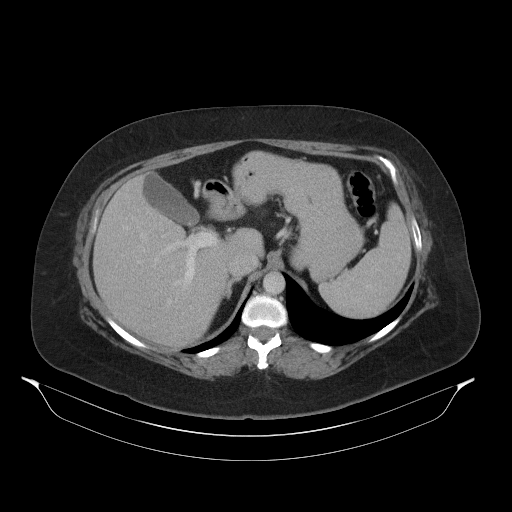
[im 76/99  lung]
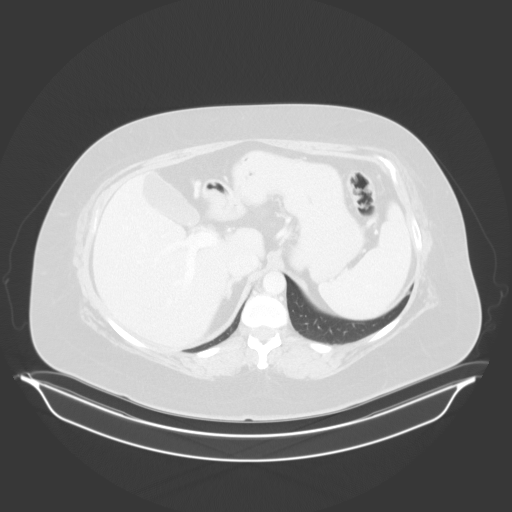
[im 83/99  soft-tissue]
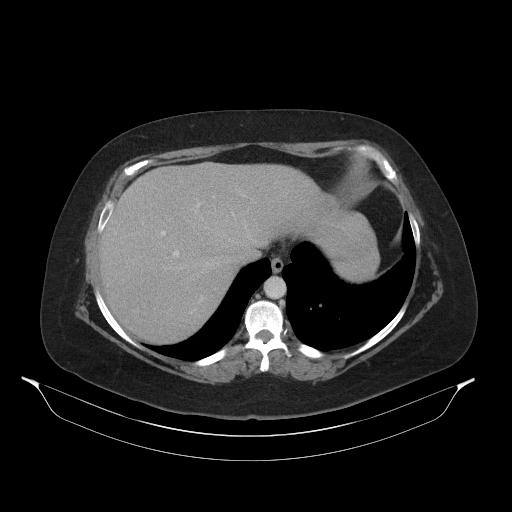
[im 83/99  lung]
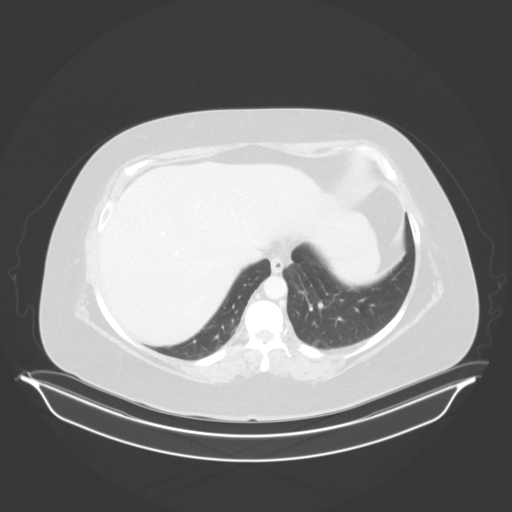
[im 91/99  soft-tissue]
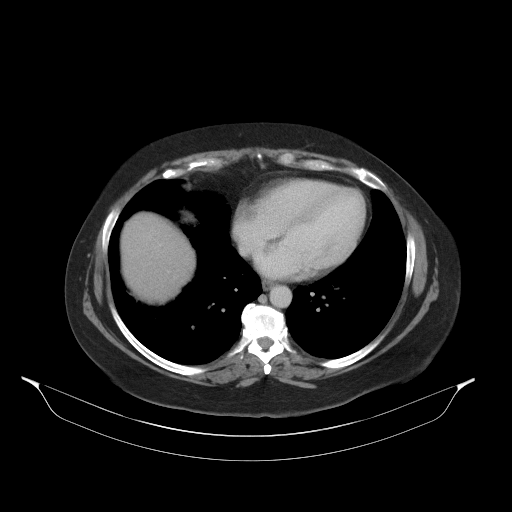
[im 91/99  lung]
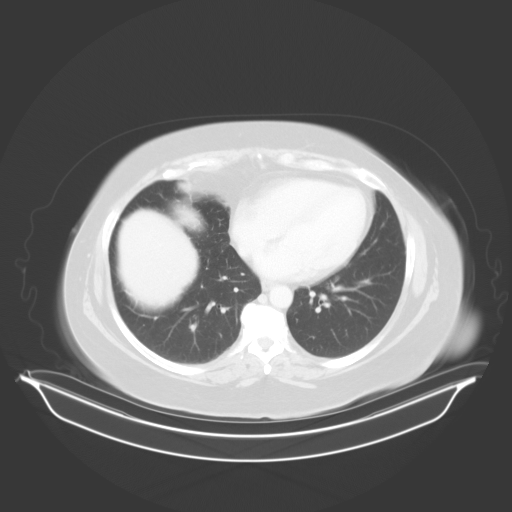

[12 of 32 positions shown; findings below may reference images not displayed]

RADIATION DOSE REDUCTION: This exam was performed according to the
departmental dose-optimization program which includes automated
exposure control, adjustment of the mA and/or kV according to
patient size and/or use of iterative reconstruction technique.

CONTRAST:  100mL [id number] IOPAMIDOL ([id number]) INJECTION 61%
FINDINGS: Lower chest: No acute abnormality.

Hepatobiliary: No suspicious hepatic lesion. Gallbladder is
unremarkable. No biliary ductal dilation.

Pancreas: No pancreatic ductal dilation or evidence of acute
inflammation.

Spleen: No splenomegaly or focal splenic lesion.

Adrenals/Urinary Tract: Bilateral adrenal glands appear normal. No
hydronephrosis. Hypodense left renal cysts measure up to 3.2 cm and
are benign requiring no independent follow-up. Kidneys demonstrate
symmetric enhancement and excretion of contrast material. Urinary
bladder is minimally distended limiting evaluation.

Stomach/Bowel: No radiopaque enteric contrast material was
administered. Stomach is minimally distended limiting evaluation. No
pathologic dilation small or large bowel. The appendix and terminal
ileum appear normal. Mild wall thickening with adjacent inflammation
involving the ascending colon. Colon is predominately decompressed.

Vascular/Lymphatic: Normal caliber abdominal aorta. No
pathologically enlarged abdominal or pelvic lymph nodes.

Reproductive: Uterus and bilateral adnexa are unremarkable.

Other: No significant abdominopelvic free fluid.

Musculoskeletal: Multilevel degenerative changes spine. No acute
osseous abnormality.
IMPRESSION: 1. Mild wall thickening with some adjacent inflammation involving
the ascending colon, possibly reflecting a mild infectious or
inflammatory colitis.
2. Normal appendix.
